# Patient Record
Sex: Female | Born: 1935 | Race: White | Hispanic: No | Smoking: Former smoker
Health system: Southern US, Community
[De-identification: ages and names within clinical notes are randomized; demographics above are authoritative.]

## PROBLEM LIST (undated history)

## (undated) DIAGNOSIS — L309 Dermatitis, unspecified: Secondary | ICD-10-CM

## (undated) DIAGNOSIS — K439 Ventral hernia without obstruction or gangrene: Secondary | ICD-10-CM

## (undated) DIAGNOSIS — M1990 Unspecified osteoarthritis, unspecified site: Secondary | ICD-10-CM

## (undated) DIAGNOSIS — E785 Hyperlipidemia, unspecified: Secondary | ICD-10-CM

## (undated) DIAGNOSIS — E119 Type 2 diabetes mellitus without complications: Secondary | ICD-10-CM

## (undated) DIAGNOSIS — E079 Disorder of thyroid, unspecified: Secondary | ICD-10-CM

## (undated) DIAGNOSIS — F0390 Unspecified dementia without behavioral disturbance: Secondary | ICD-10-CM

## (undated) DIAGNOSIS — I4891 Unspecified atrial fibrillation: Secondary | ICD-10-CM

## (undated) DIAGNOSIS — J449 Chronic obstructive pulmonary disease, unspecified: Secondary | ICD-10-CM

## (undated) DIAGNOSIS — G309 Alzheimer's disease, unspecified: Secondary | ICD-10-CM

## (undated) DIAGNOSIS — D510 Vitamin B12 deficiency anemia due to intrinsic factor deficiency: Secondary | ICD-10-CM

## (undated) DIAGNOSIS — K219 Gastro-esophageal reflux disease without esophagitis: Secondary | ICD-10-CM

## (undated) DIAGNOSIS — F0280 Dementia in other diseases classified elsewhere without behavioral disturbance: Secondary | ICD-10-CM

## (undated) DIAGNOSIS — C3490 Malignant neoplasm of unspecified part of unspecified bronchus or lung: Secondary | ICD-10-CM

## (undated) DIAGNOSIS — E7800 Pure hypercholesterolemia, unspecified: Secondary | ICD-10-CM

## (undated) DIAGNOSIS — L509 Urticaria, unspecified: Secondary | ICD-10-CM

## (undated) DIAGNOSIS — I10 Essential (primary) hypertension: Secondary | ICD-10-CM

## (undated) DIAGNOSIS — R1310 Dysphagia, unspecified: Secondary | ICD-10-CM

## (undated) DIAGNOSIS — C679 Malignant neoplasm of bladder, unspecified: Secondary | ICD-10-CM

## (undated) DIAGNOSIS — Z8709 Personal history of other diseases of the respiratory system: Secondary | ICD-10-CM

## (undated) DIAGNOSIS — H8390 Unspecified disease of inner ear, unspecified ear: Secondary | ICD-10-CM

## (undated) DIAGNOSIS — D649 Anemia, unspecified: Secondary | ICD-10-CM

## (undated) DIAGNOSIS — H25019 Cortical age-related cataract, unspecified eye: Secondary | ICD-10-CM

## (undated) DIAGNOSIS — B019 Varicella without complication: Secondary | ICD-10-CM

## (undated) DIAGNOSIS — E039 Hypothyroidism, unspecified: Secondary | ICD-10-CM

## (undated) DIAGNOSIS — J309 Allergic rhinitis, unspecified: Secondary | ICD-10-CM

## (undated) DIAGNOSIS — B029 Zoster without complications: Secondary | ICD-10-CM

## (undated) HISTORY — DX: Unspecified disease of inner ear, unspecified ear: H83.90

## (undated) HISTORY — DX: Personal history of other diseases of the respiratory system: Z87.09

## (undated) HISTORY — DX: Hypothyroidism, unspecified: E03.9

## (undated) HISTORY — DX: Type 2 diabetes mellitus without complications: E11.9

## (undated) HISTORY — DX: Gastro-esophageal reflux disease without esophagitis: K21.9

## (undated) HISTORY — DX: Cortical age-related cataract, unspecified eye: H25.019

## (undated) HISTORY — DX: Urticaria, unspecified: L50.9

## (undated) HISTORY — DX: Disorder of thyroid, unspecified: E07.9

## (undated) HISTORY — DX: Dermatitis, unspecified: L30.9

## (undated) HISTORY — DX: Unspecified osteoarthritis, unspecified site: M19.90

## (undated) HISTORY — DX: Essential (primary) hypertension: I10

## (undated) HISTORY — DX: Zoster without complications: B02.9

## (undated) HISTORY — DX: Ventral hernia without obstruction or gangrene: K43.9

## (undated) HISTORY — DX: Allergic rhinitis, unspecified: J30.9

## (undated) HISTORY — DX: Vitamin B12 deficiency anemia due to intrinsic factor deficiency: D51.0

## (undated) HISTORY — DX: Malignant neoplasm of bladder, unspecified: C67.9

## (undated) HISTORY — DX: Dementia in other diseases classified elsewhere, unspecified severity, without behavioral disturbance, psychotic disturbance, mood disturbance, and anxiety: F02.80

## (undated) HISTORY — DX: Alzheimer's disease, unspecified: G30.9

## (undated) HISTORY — DX: Varicella without complication: B01.9

## (undated) HISTORY — DX: Chronic obstructive pulmonary disease, unspecified: J44.9

## (undated) HISTORY — DX: Malignant neoplasm of unspecified part of unspecified bronchus or lung: C34.90

## (undated) HISTORY — DX: Dysphagia, unspecified: R13.10

## (undated) HISTORY — PX: HERNIA REPAIR: SHX51

## (undated) HISTORY — DX: Pure hypercholesterolemia, unspecified: E78.00

## (undated) HISTORY — DX: Unspecified atrial fibrillation: I48.91

---

## 1984-01-23 HISTORY — PX: CHOLECYSTECTOMY: SHX55

## 2001-01-22 DIAGNOSIS — C679 Malignant neoplasm of bladder, unspecified: Secondary | ICD-10-CM

## 2001-01-22 HISTORY — DX: Malignant neoplasm of bladder, unspecified: C67.9

## 2001-01-22 HISTORY — PX: BLADDER SURGERY: SHX569

## 2004-02-01 ENCOUNTER — Ambulatory Visit: Payer: Self-pay | Admitting: Internal Medicine

## 2004-02-28 ENCOUNTER — Ambulatory Visit: Payer: Self-pay | Admitting: Internal Medicine

## 2004-05-25 ENCOUNTER — Ambulatory Visit: Payer: Self-pay | Admitting: Gastroenterology

## 2004-08-21 ENCOUNTER — Ambulatory Visit: Payer: Self-pay | Admitting: Urology

## 2004-09-04 ENCOUNTER — Ambulatory Visit: Payer: Self-pay | Admitting: Internal Medicine

## 2004-09-22 ENCOUNTER — Ambulatory Visit: Payer: Self-pay | Admitting: Internal Medicine

## 2004-10-02 ENCOUNTER — Ambulatory Visit: Payer: Self-pay | Admitting: Internal Medicine

## 2004-11-07 ENCOUNTER — Ambulatory Visit: Payer: Self-pay | Admitting: Internal Medicine

## 2004-12-08 ENCOUNTER — Inpatient Hospital Stay (HOSPITAL_COMMUNITY): Admission: RE | Admit: 2004-12-08 | Discharge: 2004-12-15 | Payer: Self-pay | Admitting: Thoracic Surgery

## 2004-12-08 ENCOUNTER — Ambulatory Visit: Payer: Self-pay | Admitting: Critical Care Medicine

## 2004-12-08 ENCOUNTER — Encounter (INDEPENDENT_AMBULATORY_CARE_PROVIDER_SITE_OTHER): Payer: Self-pay | Admitting: *Deleted

## 2004-12-08 HISTORY — PX: LUNG CANCER SURGERY: SHX702

## 2004-12-18 ENCOUNTER — Ambulatory Visit: Payer: Self-pay | Admitting: Oncology

## 2004-12-20 ENCOUNTER — Encounter: Admission: RE | Admit: 2004-12-20 | Discharge: 2004-12-20 | Payer: Self-pay | Admitting: Thoracic Surgery

## 2005-01-03 ENCOUNTER — Encounter: Admission: RE | Admit: 2005-01-03 | Discharge: 2005-01-03 | Payer: Self-pay | Admitting: Thoracic Surgery

## 2005-02-08 ENCOUNTER — Ambulatory Visit: Payer: Self-pay | Admitting: Unknown Physician Specialty

## 2005-02-14 ENCOUNTER — Encounter: Admission: RE | Admit: 2005-02-14 | Discharge: 2005-02-14 | Payer: Self-pay | Admitting: Thoracic Surgery

## 2005-03-20 ENCOUNTER — Ambulatory Visit: Payer: Self-pay | Admitting: Oncology

## 2005-05-02 ENCOUNTER — Encounter: Admission: RE | Admit: 2005-05-02 | Discharge: 2005-05-02 | Payer: Self-pay | Admitting: Thoracic Surgery

## 2005-06-27 ENCOUNTER — Ambulatory Visit: Payer: Self-pay | Admitting: Internal Medicine

## 2005-08-02 ENCOUNTER — Ambulatory Visit: Payer: Self-pay | Admitting: Specialist

## 2005-09-04 ENCOUNTER — Ambulatory Visit: Payer: Self-pay | Admitting: Oncology

## 2005-09-19 ENCOUNTER — Encounter: Admission: RE | Admit: 2005-09-19 | Discharge: 2005-09-19 | Payer: Self-pay | Admitting: Thoracic Surgery

## 2006-03-04 ENCOUNTER — Ambulatory Visit: Payer: Self-pay | Admitting: Oncology

## 2006-03-06 LAB — CBC WITH DIFFERENTIAL (CANCER CENTER ONLY)
BASO#: 0.1 10*3/uL (ref 0.0–0.2)
BASO%: 0.6 % (ref 0.0–2.0)
Eosinophils Absolute: 0.3 10*3/uL (ref 0.0–0.5)
HCT: 41 % (ref 34.8–46.6)
HGB: 13.9 g/dL (ref 11.6–15.9)
LYMPH#: 2.5 10*3/uL (ref 0.9–3.3)
LYMPH%: 32.5 % (ref 14.0–48.0)
MCV: 90 fL (ref 81–101)
MONO#: 0.6 10*3/uL (ref 0.1–0.9)
NEUT%: 55.6 % (ref 39.6–80.0)
RDW: 12.2 % (ref 10.5–14.6)
WBC: 7.7 10*3/uL (ref 3.9–10.0)

## 2006-03-06 LAB — COMPREHENSIVE METABOLIC PANEL
ALT: 27 U/L (ref 0–35)
AST: 27 U/L (ref 0–37)
Alkaline Phosphatase: 82 U/L (ref 39–117)
Sodium: 140 mEq/L (ref 135–145)
Total Bilirubin: 0.5 mg/dL (ref 0.3–1.2)
Total Protein: 7 g/dL (ref 6.0–8.3)

## 2006-04-03 ENCOUNTER — Encounter: Admission: RE | Admit: 2006-04-03 | Discharge: 2006-04-03 | Payer: Self-pay | Admitting: Thoracic Surgery

## 2006-04-03 ENCOUNTER — Ambulatory Visit: Payer: Self-pay | Admitting: Thoracic Surgery

## 2006-08-26 ENCOUNTER — Ambulatory Visit: Payer: Self-pay | Admitting: Oncology

## 2006-08-27 LAB — COMPREHENSIVE METABOLIC PANEL
Alkaline Phosphatase: 78 U/L (ref 39–117)
BUN: 16 mg/dL (ref 6–23)
Glucose, Bld: 173 mg/dL — ABNORMAL HIGH (ref 70–99)
Total Bilirubin: 0.3 mg/dL (ref 0.3–1.2)

## 2006-08-27 LAB — CBC WITH DIFFERENTIAL (CANCER CENTER ONLY)
BASO#: 0.1 10*3/uL (ref 0.0–0.2)
BASO%: 0.6 % (ref 0.0–2.0)
EOS%: 2 % (ref 0.0–7.0)
HCT: 43.1 % (ref 34.8–46.6)
HGB: 14.3 g/dL (ref 11.6–15.9)
LYMPH%: 27.1 % (ref 14.0–48.0)
MCH: 29.6 pg (ref 26.0–34.0)
MCHC: 33.2 g/dL (ref 32.0–36.0)
MCV: 89 fL (ref 81–101)
MONO%: 7.1 % (ref 0.0–13.0)
NEUT%: 63.2 % (ref 39.6–80.0)
RDW: 13.1 % (ref 10.5–14.6)

## 2006-08-27 LAB — LACTATE DEHYDROGENASE: LDH: 212 U/L (ref 94–250)

## 2007-02-28 ENCOUNTER — Ambulatory Visit: Payer: Self-pay | Admitting: Oncology

## 2007-03-04 LAB — COMPREHENSIVE METABOLIC PANEL
ALT: 20 U/L (ref 0–35)
AST: 20 U/L (ref 0–37)
Albumin: 4.2 g/dL (ref 3.5–5.2)
Alkaline Phosphatase: 81 U/L (ref 39–117)
BUN: 21 mg/dL (ref 6–23)
Calcium: 9.1 mg/dL (ref 8.4–10.5)
Glucose, Bld: 161 mg/dL — ABNORMAL HIGH (ref 70–99)
Potassium: 4.2 mEq/L (ref 3.5–5.3)
Total Bilirubin: 0.4 mg/dL (ref 0.3–1.2)
Total Protein: 6.9 g/dL (ref 6.0–8.3)

## 2007-03-04 LAB — CBC WITH DIFFERENTIAL (CANCER CENTER ONLY)
Eosinophils Absolute: 0.3 10*3/uL (ref 0.0–0.5)
HCT: 44.1 % (ref 34.8–46.6)
LYMPH#: 3 10*3/uL (ref 0.9–3.3)
LYMPH%: 34.3 % (ref 14.0–48.0)
MCV: 90 fL (ref 81–101)
MONO#: 0.6 10*3/uL (ref 0.1–0.9)
NEUT%: 54.1 % (ref 39.6–80.0)
Platelets: 317 10*3/uL (ref 145–400)
RBC: 4.91 10*6/uL (ref 3.70–5.32)
WBC: 8.7 10*3/uL (ref 3.9–10.0)

## 2007-04-28 ENCOUNTER — Ambulatory Visit: Payer: Self-pay | Admitting: Internal Medicine

## 2007-08-29 ENCOUNTER — Ambulatory Visit: Payer: Self-pay | Admitting: Oncology

## 2008-03-12 ENCOUNTER — Ambulatory Visit: Payer: Self-pay | Admitting: Oncology

## 2009-02-16 ENCOUNTER — Ambulatory Visit: Payer: Self-pay | Admitting: Unknown Physician Specialty

## 2010-02-11 ENCOUNTER — Encounter: Payer: Self-pay | Admitting: Thoracic Surgery

## 2010-02-12 ENCOUNTER — Encounter: Payer: Self-pay | Admitting: Thoracic Surgery

## 2010-02-13 ENCOUNTER — Encounter: Payer: Self-pay | Admitting: Oncology

## 2010-06-09 NOTE — H&P (Signed)
Cindy [name], Cindy [name]                 ACCOUNT NO.:  [id number]   MEDICAL RECORD NO.:  [id number]          PATIENT TYPE:  INP   LOCATION:  NA                           FACILITY:  [facility]   PHYSICIAN:  Ines Bloomer, M.D. DATE OF BIRTH:  23-Aug-1933   DATE OF ADMISSION:  DATE OF DISCHARGE:                                HISTORY & PHYSICAL   CHIEF COMPLAINT:  Left upper lobe mass.   HISTORY OF PRESENT ILLNESS:  This 77 year old patient has been a long  history of smoking and she quit smoking five weeks ago.  She has a history  of chronic obstructive pulmonary disease with an FVC of 2.83 and FEV1 of  1.14 and diffusion capacity of 46%.  A CT scan showed evidence of chronic  obstructive pulmonary disease and a left upper lobe nodule 1.8 x 1.2 cm in  size.  PET scan showed an SUV of 7.6 in the left mid lateral lung.  She has  had no hemoptysis, pleuritic chest pain, fever, chills, or weight loss.   PAST MEDICAL HISTORY:  1.  She had a bladder cancer treated in 2004 with topical therapy.  2.  Hypertension.  3.  Hyperlipidemia.  4.  She had a previous cholecystectomy.   She has no allergies.   MEDICATIONS:  1.  Metoprolol 50 mg a day.  2.  Hydrochlorothiazide 25 mg a day.  3.  Benicar 20 mg daily.  4.  Lovastatin 20 mg a day.  5.  Wellbutrin 150 mg twice a day.  6.  Aspirin.   FAMILY HISTORY:  Negative for vascular disease and cancer.   SOCIAL HISTORY:  She is married.  Has three children.  Is retired.  Does not  drink.  She quit smoking five weeks ago.  She has a 50+-pack-year history.  Does not drink alcohol on a regular basis.   REVIEW OF SYSTEMS:  She is 184 pounds.  5 feet 6 inches.  CARDIAC:  No  angina or atrial fibrillation.  PULMONARY:  She gets shortness of breath  with exertion.  Has occasional wheezing.  GI:  No nausea, vomiting,  constipation, or diarrhea.  No reflux, hiatal hernia, or melena.  GU:  She  has frequent urinations.  VASCULAR:  No claudications, DVT, or  TIAs.  NEUROLOGIC:  No headaches, blackouts, or seizures.  ORTHOPEDIC:  She  complains of arthritis.  No psychiatric illnesses.  HEENT:  Eye/ENT:  No  change in her eyesight or hearing.  HEMATOLOGICAL:  No anemia.   PHYSICAL EXAMINATION:  GENERAL:  She is a slightly obese Caucasian female in  no acute distress.  VITAL SIGNS:  Blood pressure 153/90, pulse 66, respirations 18, saturations  94%.  HEENT:  Head is atraumatic.  Eyes:  Pupils are equal, round, and reactive to  light and accommodation.  Extraocular movements are normal.  Ears:  Tympanic  membranes are intact.  Nose:  There is no septal deviation.  Throat is  without mass, lesions.  NECK:  Supple with no thyromegaly, no supraclavicular adenopathy.  CHEST:  Distal breath sounds and mild bilateral  wheezing.  HEART:  Regular sinus rhythm.  No murmurs.  ABDOMEN:  Soft.  There is no hepatosplenomegaly.  EXTREMITIES:  Pulses are 2+.  There is no clubbing or edema.  NEUROLOGIC:  She is oriented x3.  Sensory and motor are intact.  SKIN:  Without lesions.   IMPRESSION:  1.  Left upper lobe mass.  2.  Chronic obstructive pulmonary disease.  3.  Hypertension.  4.  Hyperlipidemia.  5.  Status post bladder cancer.   PLAN:  Left VATS, left upper lobectomy.           ______________________________  Ines Bloomer, M.D.     DPB/MEDQ  D:  12/06/2004  T:  12/06/2004  Job:  [id number]

## 2010-06-09 NOTE — Op Note (Signed)
Cindy [name], Cindy [name]                 ACCOUNT NO.:  [id number]   MEDICAL RECORD NO.:  [id number]          PATIENT TYPE:  INP   LOCATION:  2899                         FACILITY:  [facility]   PHYSICIAN:  Ines Bloomer, M.D. DATE OF BIRTH:  November 04, 1933   DATE OF PROCEDURE:  DATE OF DISCHARGE:                                 OPERATIVE REPORT   Audio too short to transcribe (less than 5 seconds)           ______________________________  Ines Bloomer, M.D.     DPB/MEDQ  D:  12/08/2004  T:  12/08/2004  Job:  [id number]

## 2010-06-09 NOTE — Discharge Summary (Signed)
NAMEHEIDI, MACLIN                 ACCOUNT NO.:  [id number]   MEDICAL RECORD NO.:  [id number]          PATIENT TYPE:  INP   LOCATION:  3306                         FACILITY:  [facility]   PHYSICIAN:  Ines Bloomer, M.D. DATE OF BIRTH:  [birth year]   DATE OF ADMISSION:  12/08/2004  DATE OF DISCHARGE:                                 DISCHARGE SUMMARY   ADMISSION DIAGNOSIS:  Left upper lobe lung lesion.   DISCHARGE DIAGNOSIS:  1.  Hypertension.  2.  Chronic obstructive pulmonary disease.  3.  Acid reflux.  4.  Hyperlipidemia.  5.  Status post bladder cancer with bladder resection and six weeks of      chemotherapy in 2003 with Dr. Achilles Dunk.  6.  Left upper lobe lung lesion status post lingulectomy, pathology      revealing squamous cell carcinoma, T1N0MX.  7.  Postoperative atrial fibrillation, stable.   PAST SURGICAL HISTORY:  1.  Cholecystectomy.  2.  Bladder removal.  3.  Left lingulectomy, December 08, 2004.   ALLERGIES:  No known drug allergies.   BRIEF HISTORY:  The patient is a 77 year old Caucasian female with COPD who  presented to her primary care physician with increasing shortness of breath.  A chest x-ray was performed which revealed a left upper lobe lung lesion.  Subsequent CT scan confirmed left upper lobe lesion suspicious for carcinoma  and, therefore, the patient was referred to Dr. Edwyna Shell.  Dr. Edwyna Shell  evaluated the patient and referred the patient for a PET scan which revealed  an SUV of 7.6 in the left mid lateral lung.  After review of the patient and  her information, it was Dr. Scheryl Darter opinion that the patient should proceed  with left VATS and lingulectomy.   HOSPITAL COURSE:  The patient was admitted and taken to the OR on December 08, 2004, for a left video assisted thoracoscopic surgery, mini-thoracotomy,  left lingulectomy, and lymph node dissection.  Pathology revealed squamous  cell carcinoma, T1N0MX.  The patient tolerated the procedure well and  was  hemodynamically stable immediately postoperatively.  The patient was  transferred from the OR to the post anesthesia care unit in stable  condition.  The patient was extubated without complications and woke up from  anesthesia neurologically intact.  The patient's postoperative course has  progressed as expected with only minor complications.  The patient's  invasive lines and chest tubes were discontinued in a routine manner.  Her  chest x-ray has remained stable.  On postoperative day two, the patient was  noted to be in atrial fibrillation.  She was started on IV Amiodarone and  her beta blocker dose was increased.  This was maintained for several days.  The patient was subsequently successfully switched to p.o. Amiodarone.  She  is currently maintaining normal sinus rhythm.  The pulmonary critical care  medicine team has also been following the patient throughout the  postoperative course.  Secondary to the patient's pathology, Dr. Drue Second of the hematology-oncology service, was consulted on December 11, 2004.  She evaluated the patient and it was  her opinion that the patient would not  benefit from adjuvant chemotherapy  she will follow up with the patient two  weeks after discharge.  The remainder of the patient's postoperative course  progressed as expected.  On postoperative day five, the patient is afebrile  with stable vital signs and maintaining a normal sinus rhythm.  She is  currently on p.o. Amiodarone and a beta blocker.  Her chest x-ray is stable.  As long as the patient continues to progress in the current manner, she will  be ready for discharge in the next 1-2 days pending morning round re-  evaluation.   CONDITION ON DISCHARGE:  Stable.   LABORATORY DATA:  CBC and BMP on December 11, 2004, showed white count 9.6,  hemoglobin 11.4, hematocrit 33.4, platelets 263, sodium 135, potassium 3.9,  BUN 13, creatinine 1.2, glucose 136.   DISCHARGE MEDICATIONS:  1.   Toprol XL 50 mg daily.  2.  Benicar 20 mg daily.  3.  HCTZ 25 mg daily.  4.  Wellbutrin SR 150 mg b.i.d.  5.  Advair Diskus 250 per 50 mcg inhaled b.i.d.  6.  Lovastatin 20 mg daily.  7.  Amiodarone 200 mg, 2 tablets b.i.d. x 5 days, then 2 tablets daily.  8.  Tylox 1-2 p.o. q.4-6h. p.r.n. pain.   DISCHARGE INSTRUCTIONS:  Activities:  No driving and no heavy lifting x 3  weeks and the patient should continue daily breathing and walking exercises.  Diet:  Low salt, low fat.  Wound care:  The patient may shower daily and  clean the incisions with soap and water.  If wound problems arise, she  should contact the CVTS office at [phone].  Follow up appointment with Dr.  Edwyna Shell on Wednesday, December 20, 2004, at 12 p.m.  The patient should  present for chest x-ray at 11 a.m.  Follow up with Dr. Welton Flakes two weeks after  discharge, the patient should contact her office at [phone].  Follow up  with Dr. Lorin Picket, the patient is also be instructed to contact his office for  an appointment 2-4 weeks after discharge.      Pecola Leisure, PA    ______________________________  Ines Bloomer, M.D.    AY/MEDQ  D:  12/13/2004  T:  12/13/2004  Job:  [id number]   cc:   Drue Second, MD  Fax: [phone]   Dr. Lorin Picket

## 2010-06-09 NOTE — Consult Note (Signed)
NAMECERINITY, ZYNDA NO.:  [id number]   MEDICAL RECORD NO.:  [id number]          PATIENT TYPE:  INP   LOCATION:  3306                         FACILITY:  [facility]   PHYSICIAN:  Drue Second, MD       DATE OF BIRTH:  August 16, 1933   DATE OF CONSULTATION:  12/11/2004  DATE OF DISCHARGE:                                   CONSULTATION   REFERRING PHYSICIAN:  Ines Bloomer, M.D.   CHIEF COMPLAINT:  Left upper lobe lung lesion, status post lingulectomy.   HISTORY OF PRESENT ILLNESS:  Ms. Bilger is a 77 year old Caucasian female who  has COPD and is status post a left lingulectomy on December 08, 2004, for a  suspected malignant lesion in the left upper lobe.  The patient presented to  her primary care physician with increased shortness of breath and had a  chest x-ray, which revealed a left upper lobe lung lesion.  Was then  referred for a CT scan, which confirmed a left upper lobe lesion suspicious  for cancer, and its size was 1.8 x 1.2 cm per Dr. Scheryl Darter H&P.  She was  then referred for  PET scan, which revealed an SUV of 7.6 in the left  midlateral lung.  The patient saw Dr. Edwyna Shell and was scheduled for VATS with  lingulectomy done on December 08, 2004, without complications.  Prior to her  surgery, the patient denied any hemoptysis, chest pain, weight loss or gain,  fever, chills or night sweats.  She did, however, complain of a cough  productive of yellow sputum, shortness of breath, dyspnea on exertion and  two-pillow orthopnea and urinary frequency. The rest or the review of  systems was negative.  The patient's only complaint at this time is some  constipation and pain at the site of the surgery.   PAST MEDICAL HISTORY:  1.  Hypertension.  2.  COPD.  3.  Acid reflux.  4.  Hyperlipidemia.  5.  Status post bladder cancer with bladder resection and six weeks of      chemotherapy in 2003 with Dr. Achilles Dunk, urology.   MEDICATIONS:  1.  Toprol 50 mg p.o. daily.  2.   Benicar 20 mg p.o. daily.  3.  Hydrochlorothiazide 25 mg p.o. daily.  4.  Lovastatin 20 mg p.o. daily.  5.  Wellbutrin 150 mg p.o. b.i.d.  6.  Aspirin 81 mg p.o. daily.  7.  Advair 250/50 mcg one puff daily.   PAST SURGICAL HISTORY:  1.  Cholecystectomy.  2.  Bladder removal.  3.  Left lingulectomy, postop day #3.   SOCIAL HISTORY:  The patient smoked cigarettes for 50 years, about 1-1/2  packs per day, and she quit seven weeks ago.  She denies any alcohol or  illicit drug use.  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   FAMILY HISTORY:  Her mother died at the age of 75 of diabetes mellitus type  2 complications.  Her father died at 69 of  Alzheimer's disease.  One brother  died at 63 of diabetes mellitus type 2, one brother is alive at 12 with  bladder cancer.  She also has one daughter who is living with diabetes  mellitus type 1, and six other brother sisters are alive and well and her  two other daughters are alive and well.   OB/GYN HISTORY:  Menarche at age 69, menopause at age 19.  No hormone  replacement therapy.  G4, P3, AB1.   PREVENTIVE MEDICINE HISTORY:  The patient had a colonoscopy approximately  six months ago, which was normal.  She gets yearly mammograms, last  mammogram was normal.  She gets yearly Pap smears as well, which have been  normal.   PHYSICAL EXAMINATION:  VITAL SIGNS:  Temperature 97.8, T-max 99.1, blood  pressure 135/65, heart rate 85, respirations 20, and she was saturating at  97% on room air.  GENERAL:  She is awake, alert and oriented x3, in no acute distress.  She  was out of bed to chair.  HEENT:  She was normocephalic, atraumatic.  Extraocular movements were  intact.  Pupils equal, round, and reactive to light.  Oropharynx is clear  and mucous membranes were moist.  NECK:  Supple with no lymphadenopathy, and there was a right IJ triple-lumen  catheter placed.   CHEST:  Lungs were clear to auscultation bilaterally with prolonged  expiration, and the left posterior chest had a dressing that was clean, dry,  and intact.  CARDIOVASCULAR:  She was regular rate and rhythm with no murmurs.  ABDOMEN:  Soft, nontender, nondistended, bowel sounds were present, and she  was obese.  EXTREMITIES:  No clubbing, cyanosis, or edema.  NEUROLOGIC:  Nonfocal.   LABORATORY DATA:  White blood cell count 9.6, hemoglobin 11.4, platelets  263, MCV 90.1.  sodium 135, potassium 3.9, chloride 101, bicarb 28, BUN 13,  creatinine 1.2, glucose 136, calcium 8.6.   ASSESSMENT AND PLAN:  This is a 77 year old female with chronic obstructive  pulmonary disease, status post left lingulectomy secondary to a lung lesion  suspicious for cancer.  Pathology is pending.   Suspected primary lung cancer.  Although the pathology is pending, we will  be happy to see this patient at [facility] for possible  chemotherapy treatments depending on her pathology results once the patient  is discharged from the hospital.      Chauncey Reading, D.O.      Drue Second, MD  Electronically Signed    EA/MEDQ  D:  12/11/2004  T:  12/12/2004  Job:  [id number]

## 2010-12-12 ENCOUNTER — Ambulatory Visit: Payer: Self-pay | Admitting: Internal Medicine

## 2011-11-23 ENCOUNTER — Telehealth: Payer: Self-pay | Admitting: Internal Medicine

## 2011-11-23 NOTE — Telephone Encounter (Signed)
Called [phone] and left a message, called other number, but was busy. Will call back later.

## 2011-11-23 NOTE — Telephone Encounter (Signed)
Cindy [name] called about her mom Cindy [name].  Cindy [name] stated her mom's stomach as been bothering her several weeks.  Cindy [name] stated her mom has a hernia   Pain in stomach Can she been see sooner than 02/13/12

## 2011-11-23 NOTE — Telephone Encounter (Signed)
If having abdominal pain and with her history of hernia - would rec evaluation at acute care today (asap) and confirm nothing more acute going on and then I can follow up with her after that appt.  Thanks.

## 2011-11-23 NOTE — Telephone Encounter (Signed)
Called patients daughter back and advised her to seek acute care for her mother (ASAP). Patients daughter stated that she would take her (ASAP) and follow-up with you afterward.

## 2011-11-24 ENCOUNTER — Emergency Department: Payer: Self-pay | Admitting: Emergency Medicine

## 2011-11-24 LAB — COMPREHENSIVE METABOLIC PANEL WITH GFR
Albumin: 3.2 g/dL — ABNORMAL LOW
Alkaline Phosphatase: 90 U/L
Anion Gap: 10
BUN: 18 mg/dL
Bilirubin,Total: 0.4 mg/dL
Calcium, Total: 8.7 mg/dL
Chloride: 106 mmol/L
Co2: 22 mmol/L
Creatinine: 0.82 mg/dL
EGFR (African American): >60
EGFR (Non-African Amer.): >60
Glucose: 264 mg/dL — ABNORMAL HIGH
Osmolality: 287
Potassium: 3.8 mmol/L
SGOT(AST): 19 U/L
SGPT (ALT): 21 U/L
Sodium: 138 mmol/L
Total Protein: 6.9 g/dL

## 2011-11-24 LAB — CBC WITH DIFFERENTIAL/PLATELET
Basophil #: 0.1 10*3/uL (ref 0.0–0.1)
Eosinophil #: 0.2 10*3/uL (ref 0.0–0.7)
HCT: 44.4 % (ref 35.0–47.0)
HGB: 14.7 g/dL (ref 12.0–16.0)
Lymphocyte %: 33.2 %
MCHC: 33 g/dL (ref 32.0–36.0)
Monocyte %: 8.1 %
Neutrophil %: 55.7 %
RDW: 13.4 % (ref 11.5–14.5)
WBC: 9.1 10*3/uL (ref 3.6–11.0)

## 2011-11-29 ENCOUNTER — Telehealth: Payer: Self-pay | Admitting: Internal Medicine

## 2011-11-29 NOTE — Telephone Encounter (Signed)
I can see her on 12/05/11 (wednesday) at 11:45

## 2011-11-29 NOTE — Telephone Encounter (Signed)
Pt aware of appointment 

## 2011-11-29 NOTE — Telephone Encounter (Signed)
Cindy [name] called She took her mom to urgent care Saturday and urgent care sent her to [facility]  bp 209/89  215/90 went down 170/86 Er wants Cindy [name] to see dr ely for hernia surgery Cindy [name] would like to talk to dr scott before seeing ely

## 2011-12-05 ENCOUNTER — Encounter: Payer: Self-pay | Admitting: Internal Medicine

## 2011-12-05 ENCOUNTER — Ambulatory Visit (INDEPENDENT_AMBULATORY_CARE_PROVIDER_SITE_OTHER): Payer: Medicare Other | Admitting: Internal Medicine

## 2011-12-05 VITALS — BP 160/90 | HR 78 | Temp 98.2°F | Ht 63.5 in | Wt 169.8 lb

## 2011-12-05 DIAGNOSIS — C679 Malignant neoplasm of bladder, unspecified: Secondary | ICD-10-CM

## 2011-12-05 DIAGNOSIS — N289 Disorder of kidney and ureter, unspecified: Secondary | ICD-10-CM

## 2011-12-05 DIAGNOSIS — I10 Essential (primary) hypertension: Secondary | ICD-10-CM

## 2011-12-05 DIAGNOSIS — K469 Unspecified abdominal hernia without obstruction or gangrene: Secondary | ICD-10-CM

## 2011-12-05 DIAGNOSIS — E7800 Pure hypercholesterolemia, unspecified: Secondary | ICD-10-CM

## 2011-12-05 DIAGNOSIS — Z23 Encounter for immunization: Secondary | ICD-10-CM

## 2011-12-05 DIAGNOSIS — C3490 Malignant neoplasm of unspecified part of unspecified bronchus or lung: Secondary | ICD-10-CM

## 2011-12-05 DIAGNOSIS — E119 Type 2 diabetes mellitus without complications: Secondary | ICD-10-CM

## 2011-12-05 MED ORDER — NYSTATIN 100000 UNIT/GM EX CREA: TOPICAL_CREAM | Freq: Two times a day (BID) | CUTANEOUS | Status: DC

## 2011-12-05 NOTE — Patient Instructions (Signed)
It was nice seeing you today.  I am sorry you have been having problems.  We will get an appt scheduled for you - to see Dr Michela Pitcher.  Let me know if you have any problems.

## 2011-12-09 ENCOUNTER — Encounter: Payer: Self-pay | Admitting: Internal Medicine

## 2011-12-09 DIAGNOSIS — E7800 Pure hypercholesterolemia, unspecified: Secondary | ICD-10-CM | POA: Insufficient documentation

## 2011-12-09 DIAGNOSIS — I10 Essential (primary) hypertension: Secondary | ICD-10-CM | POA: Insufficient documentation

## 2011-12-09 DIAGNOSIS — Z8551 Personal history of malignant neoplasm of bladder: Secondary | ICD-10-CM | POA: Insufficient documentation

## 2011-12-09 DIAGNOSIS — C3490 Malignant neoplasm of unspecified part of unspecified bronchus or lung: Secondary | ICD-10-CM | POA: Insufficient documentation

## 2011-12-09 DIAGNOSIS — N289 Disorder of kidney and ureter, unspecified: Secondary | ICD-10-CM | POA: Insufficient documentation

## 2011-12-09 DIAGNOSIS — E119 Type 2 diabetes mellitus without complications: Secondary | ICD-10-CM | POA: Insufficient documentation

## 2011-12-09 MED ORDER — GLIMEPIRIDE 2 MG PO TABS: 2.0000 mg | ORAL_TABLET | Freq: Two times a day (BID) | ORAL | Status: DC

## 2011-12-09 NOTE — Assessment & Plan Note (Signed)
Does not check sugars.  Does not follow her diet.  Does not keep regular appts.  Instructed on the importance of checking her sugar.  Follow.  Will review ER labs.  Will need a1c.  Needs to keep up to date with eye exams.

## 2011-12-09 NOTE — Assessment & Plan Note (Signed)
Has a history of lung cancer (squamous cell) and is s/p left upper lobe lingulectomy.  Has known COPD.  Not seeing her pulmonologist.  Increased sob with exertion.  No significantly changed.  Discussed the risk of surgery with her underlying lung condition.  Will see what surgery says about the hernia.  Follow closely.

## 2011-12-09 NOTE — Assessment & Plan Note (Signed)
Blood pressure improved on my check.  Will hold on making changes in her meds.  Have her monitor her blood pressure and send in readings.  Get her back in soon to reassess.  Follow.

## 2011-12-09 NOTE — Progress Notes (Signed)
  Subjective:    Patient ID: Cindy [name], female    DOB: Apr 30, 1933, 78 y.o.   MRN: [id number]  HPI 78 year old female with past history of hypertension, hypercholesterolemia, COPD, lung cancer s/p left upper lobe lingulectomy 11/06 and bladder cancer (previously followed by Dr Achilles Dunk).  She comes in today as a work in for an ER follow up.  Has had an abdominal wall hernia for a while.  Over the last 6 months, she has noticed increased pain.  Went to the ER recently for increasing abdominal pain.  CT scan performed and she was informed that she needed to see surgery.  She came in here for a follow up.  States her bowels are moving.  She does take a stool softener bid.  No nausea or vomiting. She does report increased pain.  Blood pressure while in the ER was also elevated.  Not checking her sugars regularly.  Not watching what she eats.  Breathing is stable.  She is sob with exertion.  Not seeing her pulmonologist.    Past Medical History  Diagnosis Date  . COPD (chronic obstructive pulmonary disease)   . Lung cancer     squamous cell s/p left upper lobe llingulectomy 12/08/04 - Dr Edwyna Shell  . Bladder cancer 2003    s/p resection and chemotherapy, followed by Dr Achilles Dunk  . Hypertension   . Hypercholesterolemia   . GERD (gastroesophageal reflux disease)   . Atrial fibrillation     post op, converted to NSR wtih amiodarone  . Hypothyroidism   . Inner ear disease   . Diabetes mellitus     Review of Systems Patient denies any headache, lightheadedness or dizziness.  No sinus or allergy symptoms.  No chest pain, tightness or palpitations.  Gets sob with exertion.  No significant change.   No nausea or vomiting.  No bowel change, such as diarrhea, constipation, BRBPR or melana.  States she takes the stool softeners and this helps to regulate her bowels.  Does have the increased pain with palpation over the hernia.   No urine change.        Objective:   Physical Exam Filed Vitals:   12/05/11 1212    BP: 160/90  Pulse: 78  Temp: 98.2 F (36.8 C)   Blood pressure recheck:  73-68/84  78 year old female in no acute distress.   HEENT:  Nares - clear.  OP- without lesions or erythema.  NECK:  Supple, nontender.    HEART:  Appears to be regular. LUNGS:  Without crackles or wheezing audible.   RADIAL PULSE:  Equal bilaterally.  ABDOMEN:  Soft.   No audible abdominal bruit.  Bowel sounds present and normal.  Large hernia - pain with palpation.   EXTREMITIES:  No increased edema to be present.  Stable.                   Assessment & Plan:  HERNIA.  Abdominal wall hernia.  Now with increasing pain.  Was evaluated in the ER.  CT performed.  Obtain results.  Daughter accompanies her.  History obtained from both of them.  States ER recommended surgery evaluation.  Desires to see Dr Michela Pitcher.  Will arrange appt.    HEALTH MAINTENANCE.  Overdue a physical .  Arrange.  Has been noncompliant with keeping appts.

## 2011-12-09 NOTE — Assessment & Plan Note (Signed)
Low fat/low cholesterol diet.  Check lipid panel with next labs.  Follow.

## 2011-12-09 NOTE — Assessment & Plan Note (Signed)
Renal function on last check improved.  Last Cr I have documented 10/12- 1.0.  Obtain records from ER.  Avoid antiinflammatories and other nephrotoxic drugs.  Follow.

## 2011-12-09 NOTE — Assessment & Plan Note (Signed)
She has stopped follow ups and treatments with Dr Achilles Dunk.  Needs follow up.

## 2011-12-12 ENCOUNTER — Telehealth: Payer: Self-pay | Admitting: Internal Medicine

## 2011-12-12 NOTE — Telephone Encounter (Signed)
Glimepiride 2mg  tablet  180.0 ea [facility] [address] Date written 06/19/11 Take 1 tablet by mouth twice a day  Appointment with dr Ken[place] 02/13/12

## 2011-12-14 NOTE — Telephone Encounter (Signed)
Patient stated that she has already gotten this script.

## 2012-01-02 ENCOUNTER — Other Ambulatory Visit: Payer: Self-pay | Admitting: Internal Medicine

## 2012-01-02 NOTE — Telephone Encounter (Signed)
Pt is needing refill on Glimpride ??? She uses [facility] in [place]

## 2012-01-02 NOTE — Telephone Encounter (Signed)
Patient called and stated that she had not gotten her prescription yet. I called pharmacy again and they had not filled rx, there was a mix-up. Pharmacy said they fixed mix-up and where going to fill rx. Called patient back to let her know.

## 2012-01-02 NOTE — Telephone Encounter (Signed)
Notify pt med had been called [facility] to [facility] - I clarified with them.  They have updated rx

## 2012-01-03 ENCOUNTER — Telehealth: Payer: Self-pay | Admitting: Internal Medicine

## 2012-01-03 ENCOUNTER — Other Ambulatory Visit: Payer: Self-pay | Admitting: *Deleted

## 2012-01-03 DIAGNOSIS — Z760 Encounter for issue of repeat prescription: Secondary | ICD-10-CM

## 2012-01-03 MED ORDER — DOCUSATE SODIUM 100 MG PO CAPS: 100.0000 mg | ORAL_CAPSULE | Freq: Two times a day (BID) | ORAL | Status: DC

## 2012-01-03 MED ORDER — NYSTATIN 100000 UNIT/GM EX CREA: TOPICAL_CREAM | Freq: Two times a day (BID) | CUTANEOUS | Status: DC

## 2012-01-03 MED ORDER — METOPROLOL SUCCINATE ER 50 MG PO TB24: 50.0000 mg | ORAL_TABLET | Freq: Every day | ORAL | Status: DC

## 2012-01-03 NOTE — Telephone Encounter (Signed)
Per pharmacy scripts had to be ordered;patient was given six until shipment came. Refilled scripts for nystatin and metoprolol

## 2012-01-03 NOTE — Telephone Encounter (Signed)
Refilled nystatin and metoprolol. Patients other scripts had to be ordered and they will be in this afternoon per pharmacy.

## 2012-01-03 NOTE — Telephone Encounter (Signed)
Pt's daughter is calling back and saying they never received the rx ??

## 2012-01-03 NOTE — Telephone Encounter (Signed)
Please call pharmacy because I personally spoke to pharmacist yesterday and clarified rx.

## 2012-01-03 NOTE — Telephone Encounter (Signed)
Refill request for glimepiride 2 mg table Qty: 180  Sig: take 1 tablet by mouth twice a day  Patient has been seen

## 2012-01-04 ENCOUNTER — Other Ambulatory Visit: Payer: Self-pay | Admitting: *Deleted

## 2012-01-28 ENCOUNTER — Telehealth: Payer: Self-pay | Admitting: Internal Medicine

## 2012-01-28 NOTE — Telephone Encounter (Signed)
Patient notified. Advised patient's daughter to follow-up with [place] after.

## 2012-01-28 NOTE — Telephone Encounter (Signed)
With these symptoms and he history  - she needs evaluation - to acute care tonight

## 2012-01-28 NOTE — Telephone Encounter (Signed)
Patient Information:  Caller Name: Alexia Freestone  Phone: [phone]  Patient: Cindy [name], Cindy [name]  Gender: Female  DOB: 1933-10-29  Age: 79 Years  PCP: [facility]  Office Follow Up:  Does the office need to follow up with this patient?: Yes  Instructions For The Office: Sister is asking for medication since it is to be so bitterly cold on Tuesday.  I told her that I would ask if this can be done without an appt.  RN Note:  Unable to triage, she is not with her sister.  Wanting an appt.  Symptoms  Reason For Call & Symptoms: Sister calling, has had a cough, nasal congestion, decreased appetite.  She is not with her sister at this time.  Reviewed Health History In EMR: N/A  Reviewed Medications In EMR: N/A  Reviewed Allergies In EMR: N/A  Reviewed Surgeries / Procedures: N/A  Date of Onset of Symptoms: 01/23/2012  Treatments Tried: Robitussin MD  Treatments Tried Worked: No  Guideline(s) Used:  Cough  No Protocol Available - Sick Adult  Disposition Per Guideline:   Discuss with PCP and Callback by Nurse within 1 Hour  Reason For Disposition Reached:   Nursing judgment  Advice Given:  N/A

## 2012-01-29 ENCOUNTER — Telehealth: Payer: Self-pay | Admitting: Internal Medicine

## 2012-01-29 ENCOUNTER — Ambulatory Visit: Payer: Self-pay | Admitting: Internal Medicine

## 2012-01-29 NOTE — Telephone Encounter (Signed)
Patient Information:  Caller Name: Misty Stanley  Phone: [phone]  Patient: Cindy [name], Cindy [name]  Gender: Female  DOB: Oct 06, 1933  Age: 79 Years  PCP: Dale Wisdom  Office Follow Up:  Does the office need to follow up with this patient?: No  Instructions For The Office: N/A  RN Note:  Caller states she will call back if she is unable to get patient to come to the appt  Symptoms  Reason For Call & Symptoms: caller reports pt has been sick for 3 weeks and she cant get her breath good.  Caller states that she called yesterday and was advised for pt to be seen at the urgent care.  Caller states that she cannot get pt to go to the urgent care.  Caller states she is not with patient right now.  RN asked caller if she would come see Dr Lorin Picket in the office today.  Caller is going to contact patient to see if she would come in and see Dr Lorin Picket.  Caller states the she and her sister are going to try to get patient to the office today at 1015.  Appt made in EPIC  Reviewed Health History In EMR: No  Reviewed Medications In EMR: No  Reviewed Allergies In EMR: No  Reviewed Surgeries / Procedures: No  Date of Onset of Symptoms: 01/08/2012  Guideline(s) Used:  No Protocol Available - Sick Adult  Disposition Per Guideline:   See Today in Office  Reason For Disposition Reached:   Nursing judgment  Advice Given:  N/A  Appointment Scheduled:  01/29/2012 10:15:00 Appointment Scheduled Provider:  Dale Broadlands

## 2012-02-13 ENCOUNTER — Encounter: Payer: Self-pay | Admitting: Internal Medicine

## 2012-02-13 ENCOUNTER — Ambulatory Visit (INDEPENDENT_AMBULATORY_CARE_PROVIDER_SITE_OTHER): Payer: Medicare Other | Admitting: Internal Medicine

## 2012-02-13 VITALS — BP 140/76 | HR 80 | Temp 98.5°F | Ht 63.5 in | Wt 186.5 lb

## 2012-02-13 DIAGNOSIS — R5381 Other malaise: Secondary | ICD-10-CM

## 2012-02-13 DIAGNOSIS — I10 Essential (primary) hypertension: Secondary | ICD-10-CM

## 2012-02-13 DIAGNOSIS — C3490 Malignant neoplasm of unspecified part of unspecified bronchus or lung: Secondary | ICD-10-CM

## 2012-02-13 DIAGNOSIS — R269 Unspecified abnormalities of gait and mobility: Secondary | ICD-10-CM

## 2012-02-13 DIAGNOSIS — R5383 Other fatigue: Secondary | ICD-10-CM

## 2012-02-13 DIAGNOSIS — N289 Disorder of kidney and ureter, unspecified: Secondary | ICD-10-CM

## 2012-02-13 DIAGNOSIS — Z139 Encounter for screening, unspecified: Secondary | ICD-10-CM

## 2012-02-13 DIAGNOSIS — R2681 Unsteadiness on feet: Secondary | ICD-10-CM

## 2012-02-13 DIAGNOSIS — C679 Malignant neoplasm of bladder, unspecified: Secondary | ICD-10-CM

## 2012-02-13 DIAGNOSIS — E119 Type 2 diabetes mellitus without complications: Secondary | ICD-10-CM

## 2012-02-13 DIAGNOSIS — E7800 Pure hypercholesterolemia, unspecified: Secondary | ICD-10-CM

## 2012-02-14 ENCOUNTER — Encounter: Payer: Self-pay | Admitting: Internal Medicine

## 2012-02-14 MED ORDER — DOCUSATE SODIUM 100 MG PO CAPS: 100.0000 mg | ORAL_CAPSULE | Freq: Two times a day (BID) | ORAL | Status: DC

## 2012-02-14 MED ORDER — NYSTATIN 100000 UNIT/GM EX POWD: Freq: Two times a day (BID) | CUTANEOUS | Status: DC

## 2012-02-14 NOTE — Progress Notes (Signed)
Subjective:    Patient ID: Cindy [name], female    DOB: 05-07-1933, 79 y.o.   MRN: [id number]  HPI 79 year old female with past history of hypertension, hypercholesterolemia, COPD, lung cancer s/p left upper lobe lingulectomy 11/06 and bladder cancer (previously followed by Dr Achilles Dunk).  She comes in today to follow up on these issues as well as for her physical exam.  She is accompanied by her sister.  History obtained from both of them.  Has had an abdominal wall hernia for a while.  Over the last 6 months, she has noticed increased pain.  Went to the ER recently for increasing abdominal pain.  CT scan performed and she was informed that she needed to see surgery.  She saw Dr Felipa Evener and stated that "he was leaving it up to (her)" regarding having the surgery.  She still notices increased pain, but mainly when pressure is applied to the area.  She is taking stool softeners and bowels are moving.  No blood.  Eating and drinking.  No nausea or vomiting.  Not checking her sugars regularly.  Not watching what she eats.  Breathing is stable.  She is sob with exertion.  Not seeing her pulmonologist.  Has not see Dr Achilles Dunk recently.   Past Medical History  Diagnosis Date  . COPD (chronic obstructive pulmonary disease)   . Lung cancer     squamous cell s/p left upper lobe llingulectomy 12/08/04 - Dr Edwyna Shell  . Bladder cancer 2003    s/p resection and chemotherapy, followed by Dr Achilles Dunk  . Hypertension   . Hypercholesterolemia   . GERD (gastroesophageal reflux disease)   . Atrial fibrillation     post op, converted to NSR wtih amiodarone  . Hypothyroidism   . Inner ear disease   . Diabetes mellitus     Current Outpatient Prescriptions on File Prior to Visit  Medication Sig Dispense Refill  . aspirin 81 MG tablet Take 81 mg by mouth daily.      Marland Kitchen glimepiride (AMARYL) 2 MG tablet Take 2 mg by mouth daily.      Marland Kitchen losartan (COZAAR) 100 MG tablet Take 100 mg by mouth daily.      Marland Kitchen lovastatin (MEVACOR) 20  MG tablet Take 20 mg by mouth at bedtime.      . metoprolol succinate (TOPROL-XL) 50 MG 24 hr tablet Take 1 tablet (50 mg total) by mouth daily. Take with or immediately following a meal.  90 tablet  1  . nystatin cream (MYCOSTATIN) Apply topically 2 (two) times daily.  30 g  0  . tiotropium (SPIRIVA) 18 MCG inhalation capsule Place 18 mcg into inhaler and inhale daily.        Review of Systems Patient denies any headache, lightheadedness or dizziness. Is unsteady when she walks.  Stable.  No sinus or allergy symptoms.  No chest pain, tightness or palpitations.  Gets sob with exertion.  No significant change.   No nausea or vomiting.  No bowel change, such as diarrhea, constipation, BRBPR or melana.  States she takes the stool softeners and this helps to regulate her bowels.  Does have the increased pain with palpation over the hernia.   No urine change.  Discussed with her regarding surgery for the hernia.  She is not interested in pursuing.  Discussed risk of surgery.       Objective:   Physical Exam  Filed Vitals:   02/13/12 1125  BP: 140/76  Pulse: 80  Temp:  98.5 F (36.9 C)   Blood pressure recheck:  7/20  79 year old female in no acute distress.   HEENT:  Nares- clear.  Oropharynx - without lesions. NECK:  Supple.  Nontender.  No audible bruit.  HEART:  Appears to be regular. LUNGS:  No crackles or wheezing audible.  Respirations even and unlabored.  RADIAL PULSE:  Equal bilaterally.    BREASTS:  No nipple discharge or nipple retraction present.  Could not appreciate any distinct nodules or axillary adenopathy.  ABDOMEN:  Soft.  Bowel sounds present and normal.  No audible abdominal bruit.  Hernia present.  Increased tenderness to palpation over the hernia.  No abdominal distention.   GU:  Normal external genitalia.  Vaginal vault without lesions.  Cervix identified.  No lesions.  Pap not performed.  Could not appreciate any adnexal masses or tenderness.   RECTAL:  Heme  negative.   EXTREMITIES:  No increased edema present.  DP pulses palpable and equal bilaterally.              Assessment & Plan:  HERNIA.  Abdominal wall hernia.  Pain and exam as outlined.   Was evaluated in the ER.  CT performed.  Discussed at length with her today.  Discussed risk of surgery and risk of unrepaired hernia.  She is not interested in pursuing surgery at this time. Will follow.  Knows if any worsening change or problems, she is to be evaluated immediately.    UNSTEADY GAIT.  Has been an issue for a while.  No significant change.  See my previous [facility] notes for details regarding w/up.  Previous MRI 1/11 revealed some mild involutional changes otherwise negative.  Use walker.  Check B12.  Follow.    CARDIOVASCULAR.  Denies any chest pain, etc. Breathing stable.  Follow.   HEALTH MAINTENANCE.  Physical today.  Schedule mammogram.  She declines GI evaluation.

## 2012-02-14 NOTE — Assessment & Plan Note (Signed)
Breathing stable per her report.  Gets sob with exertion.  Declined referral or further testing at this time.

## 2012-02-14 NOTE — Assessment & Plan Note (Signed)
Overdue follow up with Dr Achilles Dunk.  Schedule appt.

## 2012-02-14 NOTE — Assessment & Plan Note (Signed)
Noncompliant with diet.  Does not check her sugars.  On amaryl.  No significant lows reported.  Have her check her sugars.  Record.  Send in.  Adjust medication if necessary.  Diabetic diet.  Check metabolic panel and a1c.

## 2012-02-14 NOTE — Assessment & Plan Note (Signed)
Low cholesterol diet and exercise.  Check lipid panel and liver function. On lovastatin.

## 2012-02-14 NOTE — Assessment & Plan Note (Signed)
Check metabolic panel to confirm stable.    

## 2012-02-14 NOTE — Assessment & Plan Note (Signed)
Blood pressure doing well.  Check metabolic panel.  Same medication regimen.

## 2012-05-21 ENCOUNTER — Other Ambulatory Visit: Payer: Self-pay | Admitting: Internal Medicine

## 2012-05-21 MED ORDER — TIOTROPIUM BROMIDE MONOHYDRATE 18 MCG IN CAPS: 18.0000 ug | ORAL_CAPSULE | Freq: Every day | RESPIRATORY_TRACT | Status: DC

## 2012-05-21 NOTE — Telephone Encounter (Signed)
Please Advise..... Refill  Docusate Sodium (Colace) 100mg  Take one capsule by mouth twice a day #60 1rf  [facility] - [address]

## 2012-05-21 NOTE — Progress Notes (Signed)
Refilled spiriva x 3 months total.

## 2012-06-12 ENCOUNTER — Ambulatory Visit: Payer: Medicare Other | Admitting: Internal Medicine

## 2012-06-16 ENCOUNTER — Inpatient Hospital Stay: Payer: Self-pay | Admitting: Internal Medicine

## 2012-06-16 LAB — COMPREHENSIVE METABOLIC PANEL
Alkaline Phosphatase: 114 U/L (ref 50–136)
Anion Gap: 9 (ref 7–16)
BUN: 15 mg/dL (ref 7–18)
Chloride: 101 mmol/L (ref 98–107)
EGFR (Non-African Amer.): >60
Osmolality: 286 (ref 275–301)
Potassium: 4.5 mmol/L (ref 3.5–5.1)
SGOT(AST): 43 U/L — ABNORMAL HIGH (ref 15–37)
SGPT (ALT): 33 U/L (ref 12–78)

## 2012-06-16 LAB — URINALYSIS, COMPLETE
Bilirubin,UR: NEGATIVE
Blood: NEGATIVE
Leukocyte Esterase: NEGATIVE
Protein: NEGATIVE
RBC,UR: 1 /HPF (ref 0–5)
Squamous Epithelial: 1
WBC UR: <1 /HPF (ref 0–5)

## 2012-06-16 LAB — CBC
HCT: 46.9 % (ref 35.0–47.0)
HGB: 15.4 g/dL (ref 12.0–16.0)
RBC: 5.4 10*6/uL — ABNORMAL HIGH (ref 3.80–5.20)
WBC: 13 10*3/uL — ABNORMAL HIGH (ref 3.6–11.0)

## 2012-06-16 LAB — TROPONIN I: Troponin-I: <0.02 ng/mL

## 2012-06-17 ENCOUNTER — Ambulatory Visit: Payer: Medicare Other | Admitting: Internal Medicine

## 2012-06-17 ENCOUNTER — Telehealth: Payer: Self-pay | Admitting: Internal Medicine

## 2012-06-17 LAB — CBC WITH DIFFERENTIAL/PLATELET
Basophil #: 0 10*3/uL (ref 0.0–0.1)
Basophil %: 0.1 %
Eosinophil #: 0 10*3/uL (ref 0.0–0.7)
Eosinophil %: 0 %
HGB: 12.9 g/dL (ref 12.0–16.0)
Lymphocyte #: 0.9 10*3/uL — ABNORMAL LOW (ref 1.0–3.6)
MCH: 28.8 pg (ref 26.0–34.0)
MCHC: 33.4 g/dL (ref 32.0–36.0)
MCV: 86 fL (ref 80–100)
Monocyte #: 0.4 x10 3/mm (ref 0.2–0.9)
Monocyte %: 2.7 %
Neutrophil #: 12.6 10*3/uL — ABNORMAL HIGH (ref 1.4–6.5)
Neutrophil %: 90.7 %
Platelet: 281 10*3/uL (ref 150–440)
RBC: 4.49 10*6/uL (ref 3.80–5.20)

## 2012-06-17 LAB — BASIC METABOLIC PANEL
Co2: 26 mmol/L (ref 21–32)
Creatinine: 1.06 mg/dL (ref 0.60–1.30)
EGFR (African American): 59 — ABNORMAL LOW
Glucose: 386 mg/dL — ABNORMAL HIGH (ref 65–99)
Sodium: 139 mmol/L (ref 136–145)

## 2012-06-17 NOTE — Telephone Encounter (Signed)
Noted.  Notify her daughter Cranford Mon to keep [place] posted).

## 2012-06-17 NOTE — Telephone Encounter (Signed)
Patient's daughter called to inform Dr. Lorin Picket that Cindy [name] is in the hospital.

## 2012-06-18 NOTE — Telephone Encounter (Signed)
Spoke with daughter Misty Stanley). She will keep you posted & I asked her to call me & let me know when Ms. Anschutz is discharged so I can request records. FYI:  Her hands are bandaged from the fall. Her BP & sugars seems to be under control right now. And they are trying to place her in a home for a while (at least).

## 2012-06-20 ENCOUNTER — Telehealth: Payer: Self-pay | Admitting: *Deleted

## 2012-06-20 NOTE — Telephone Encounter (Signed)
Daughter Misty Stanley) called to report that Miki was discharged yesterday (records requested) & she is currently at [facility]. However, they are not pleased with the facility. She went to PT this morning & she was left in the bathroom. When she got out, she had vomit all over her and she had to change herself. When she pushed the button for help nobody responded & Misty Stanley had to reset the button herself. Misty Stanley feels that she is not being watched, responded to, or taken care of there & she wants to take her home. Pt is currently in their overflow room & they told her she would be in there for a few days possibly.

## 2012-06-20 NOTE — Telephone Encounter (Signed)
I would recommend she let the staff know of her concerns (and the supervisor).  Can see if they offer suggestions - so that she can continue to get the therapy they can give.  Can keep [place] posted.

## 2012-06-20 NOTE — Telephone Encounter (Signed)
She has already reported her issues to the supervisor. They stated that they were unaware of the situations. Hopefully it will be addressed.

## 2012-07-03 ENCOUNTER — Emergency Department: Payer: Self-pay | Admitting: Emergency Medicine

## 2012-07-03 LAB — MAGNESIUM: Magnesium: 1.1 mg/dL — ABNORMAL LOW

## 2012-07-03 LAB — CBC
HCT: 40.1 % (ref 35.0–47.0)
HGB: 13.4 g/dL (ref 12.0–16.0)
MCH: 28.8 pg (ref 26.0–34.0)
Platelet: 197 10*3/uL (ref 150–440)
RBC: 4.65 10*6/uL (ref 3.80–5.20)
RDW: 13.6 % (ref 11.5–14.5)
WBC: 8 10*3/uL (ref 3.6–11.0)

## 2012-07-03 LAB — CK TOTAL AND CKMB (NOT AT ARMC): CK, Total: 36 U/L (ref 21–215)

## 2012-07-03 LAB — COMPREHENSIVE METABOLIC PANEL
Albumin: 3 g/dL — ABNORMAL LOW (ref 3.4–5.0)
Anion Gap: 10 (ref 7–16)
BUN: 15 mg/dL (ref 7–18)
Bilirubin,Total: 0.4 mg/dL (ref 0.2–1.0)
Calcium, Total: 8.6 mg/dL (ref 8.5–10.1)
Chloride: 106 mmol/L (ref 98–107)
Creatinine: 0.9 mg/dL (ref 0.60–1.30)
Osmolality: 285 (ref 275–301)
SGOT(AST): 14 U/L — ABNORMAL LOW (ref 15–37)
Total Protein: 6.2 g/dL — ABNORMAL LOW (ref 6.4–8.2)

## 2012-07-03 LAB — TROPONIN I: Troponin-I: <0.02 ng/mL

## 2012-07-10 ENCOUNTER — Telehealth: Payer: Self-pay | Admitting: Internal Medicine

## 2012-07-10 NOTE — Telephone Encounter (Signed)
Daughter called in today stating her mom has came home Tuesday from  [facility].  She said that you had asked her to keep you informed on her mom, she is coming in to see you on 07/28/12.

## 2012-07-10 NOTE — Telephone Encounter (Signed)
Just an FYI (see note)

## 2012-07-14 ENCOUNTER — Ambulatory Visit (INDEPENDENT_AMBULATORY_CARE_PROVIDER_SITE_OTHER): Payer: Medicare Other | Admitting: Internal Medicine

## 2012-07-14 ENCOUNTER — Encounter: Payer: Self-pay | Admitting: Internal Medicine

## 2012-07-14 ENCOUNTER — Telehealth: Payer: Self-pay | Admitting: Internal Medicine

## 2012-07-14 VITALS — BP 148/78 | HR 71 | Temp 97.7°F | Wt 170.0 lb

## 2012-07-14 DIAGNOSIS — B3789 Other sites of candidiasis: Secondary | ICD-10-CM | POA: Insufficient documentation

## 2012-07-14 DIAGNOSIS — L259 Unspecified contact dermatitis, unspecified cause: Secondary | ICD-10-CM

## 2012-07-14 DIAGNOSIS — L309 Dermatitis, unspecified: Secondary | ICD-10-CM

## 2012-07-14 MED ORDER — NYSTATIN 100000 UNIT/GM EX POWD: Freq: Two times a day (BID) | CUTANEOUS | Status: DC

## 2012-07-14 MED ORDER — TRIAMCINOLONE ACETONIDE 0.1 % EX CREA: TOPICAL_CREAM | Freq: Two times a day (BID) | CUTANEOUS | Status: DC

## 2012-07-14 NOTE — Telephone Encounter (Signed)
ok 

## 2012-07-14 NOTE — Telephone Encounter (Signed)
The Pt called to report that she has seen & evaluated Ms. Cindy [name]. She would like to see her 3 x's a week for one week, and 2 x's a week for 3 weeks. Needs a verbal order to see patient. Please advise

## 2012-07-14 NOTE — Telephone Encounter (Signed)
FYI

## 2012-07-14 NOTE — Progress Notes (Signed)
Subjective:    Patient ID: Cindy [name], female    DOB: 05-19-1933, 79 y.o.   MRN: [id number]  HPI 79YO female with h/o COPD, DM, HTN, HL presents for acute visit. She is concerned about an itchy rash over her right upper arm, back, and abdomen. She first noticed the rash when she was in rehabilitation at [facility] 3 weeks ago. It started on her right upper arm. She denies any exposure to new lotions, detergents, perfumes. She did recently start metformin which is new for her. The rash gradually spread up her arm and across her back. It is described as extremely itchy and keeps her awake at night. She has been taking Benadryl at night with minimal improvement. After coming home, family member also tried applying topical triple antibiotic ointment with no improvement. She denies any other symptoms such as fever, chills, change in bowel habits. She is not taking any supplemental medications. She has never had a rash like this. She has had a history of rash under her breasts particularly during the summer months which has improved with the use of nystatin powder or cream.  Outpatient Encounter Prescriptions as of 07/14/2012  Medication Sig Dispense Refill  . aspirin 81 MG tablet Take 81 mg by mouth daily.      Marland Kitchen docusate sodium (COLACE) 100 MG capsule TAKE ONE CAPSULE BY MOUTH TWICE A DAY  60 capsule  1  . fluticasone-salmeterol (ADVAIR HFA) 115-21 MCG/ACT inhaler Inhale 2 puffs into the lungs 2 (two) times daily.      Marland Kitchen glimepiride (AMARYL) 2 MG tablet Take 2 mg by mouth daily.      Marland Kitchen losartan (COZAAR) 100 MG tablet Take 100 mg by mouth daily.      Marland Kitchen lovastatin (MEVACOR) 20 MG tablet Take 20 mg by mouth at bedtime.      . metoprolol succinate (TOPROL-XL) 50 MG 24 hr tablet Take 1 tablet (50 mg total) by mouth daily. Take with or immediately following a meal.  90 tablet  1  . nystatin (MYCOSTATIN) powder Apply topically 2 (two) times daily.  60 g  2  . nystatin cream (MYCOSTATIN) Apply topically 2  (two) times daily.  30 g  0  . tiotropium (SPIRIVA) 18 MCG inhalation capsule Place 1 capsule (18 mcg total) into inhaler and inhale daily.  30 capsule  2  . [DISCONTINUED] nystatin (MYCOSTATIN) powder Apply topically 2 (two) times daily.  60 g  0  . metFORMIN (GLUCOPHAGE) 500 MG tablet Take 500 mg by mouth 2 (two) times daily with a meal.       No facility-administered encounter medications on file as of 07/14/2012.   BP 148/78  Pulse 71  Temp(Src) 97.7 F (36.5 C) (Oral)  Wt 170 lb (77.111 kg)  BMI 29.64 kg/m2  SpO2 95%  Review of Systems  Constitutional: Negative for fever, chills, appetite change, fatigue and unexpected weight change.  HENT: Negative for ear pain, congestion, sore throat, trouble swallowing, neck pain, voice change and sinus pressure.   Eyes: Negative for visual disturbance.  Respiratory: Negative for cough, shortness of breath, wheezing and stridor.   Cardiovascular: Negative for chest pain, palpitations and leg swelling.  Gastrointestinal: Negative for nausea, vomiting, abdominal pain, diarrhea, constipation, blood in stool, abdominal distention and anal bleeding.  Genitourinary: Negative for dysuria and flank pain.  Musculoskeletal: Positive for myalgias. Negative for arthralgias and gait problem.  Skin: Positive for color change and rash.  Neurological: Negative for dizziness and headaches.  Hematological:  Negative for adenopathy. Does not bruise/bleed easily.  Psychiatric/Behavioral: Negative for suicidal ideas, sleep disturbance and dysphoric mood. The patient is not nervous/anxious.        Objective:   Physical Exam  Constitutional: She is oriented to person, place, and time. She appears well-developed and well-nourished. No distress.  HENT:  Head: Normocephalic and atraumatic.  Right Ear: External ear normal.  Left Ear: External ear normal.  Nose: Nose normal.  Mouth/Throat: Oropharynx is clear and moist. No oropharyngeal exudate.  Eyes:  Conjunctivae are normal. Pupils are equal, round, and reactive to light. Right eye exhibits no discharge. Left eye exhibits no discharge. No scleral icterus.  Neck: Normal range of motion. Neck supple. No tracheal deviation present. No thyromegaly present.  Cardiovascular: Normal rate, regular rhythm, normal heart sounds and intact distal pulses.  Exam reveals no gallop and no friction rub.   No murmur heard. Pulmonary/Chest: Effort normal and breath sounds normal. No accessory muscle usage. Not tachypneic. No respiratory distress. She has no decreased breath sounds. She has no wheezes. She has no rhonchi. She has no rales. She exhibits no tenderness.  Musculoskeletal: Normal range of motion. She exhibits no edema and no tenderness.  Lymphadenopathy:    She has no cervical adenopathy.  Neurological: She is alert and oriented to person, place, and time. No cranial nerve deficit. She exhibits normal muscle tone. Coordination normal.  Skin: Skin is warm and dry. Rash noted. She is not diaphoretic. There is erythema. No pallor.     Psychiatric: She has a normal mood and affect. Her behavior is normal. Judgment and thought content normal.          Assessment & Plan:

## 2012-07-14 NOTE — Assessment & Plan Note (Signed)
Erythematous, maculopapular,pruritic rash most consistent with dermatitis. Unclear allergen. Will try applying topical triamcinolone cream. Given that she recently started metformin, we'll also try holding this medication for couple of days. She will call with an update in 2-3 days. If no improvement, would favor biopsy by dermatology.

## 2012-07-14 NOTE — Telephone Encounter (Signed)
Patient Information:  Caller Name: Alexia Freestone  Phone: [phone]  Patient: Cindy [name], Cindy [name]  Gender: Female  DOB: 01-10-1934  Age: 79 Years  PCP: Dale Owasa  Office Follow Up:  Does the office need to follow up with this patient?: No  Instructions For The Office: N/A  RN Note:  Advised to hold Benadryl until after sees MD. Dr Lorin Picket has no more appointments. Unable to get patient ready and to office for next available appointment at 1130 07/14/12 so scheduled for next available opening this afternoon.   Symptoms  Reason For Call & Symptoms: Itchy, rash, "like welts, or hives" that began on right arm and spread to breasts.  Sister not present for triage. FBS unknown.  Reviewed Health History In EMR: Yes  Reviewed Medications In EMR: Yes  Reviewed Allergies In EMR: Yes  Reviewed Surgeries / Procedures: Yes  Date of Onset of Symptoms: 07/01/2012  Treatments Tried: Benadryl  Treatments Tried Worked: No  Guideline(s) Used:  Hives  Disposition Per Guideline:   See Today or Tomorrow in Office  Reason For Disposition Reached:   Hives persist > 1 week  Advice Given:  Food Related Hives:  Hives from foods usually disappear within 6 hours.  Hydrocortisone Cream:  For very itchy spots, apply hydrocortisone cream 4 times a day as needed.  Patient Will Follow Care Advice:  YES  Appointment Scheduled:  07/14/2012 14:15:00 Appointment Scheduled Provider:  Ronna Polio (Adults only)

## 2012-07-14 NOTE — Assessment & Plan Note (Signed)
Recurrent candidiasis over skin underneath bilateral breasts. Will start Nystatin powder bid. Follow up 2 weeks.

## 2012-07-14 NOTE — Patient Instructions (Signed)
Stop Metformin for next 2-3 days. Start Triamcinolone cream over rash twice daily.  Monitor symptoms. Call with update on Thursday.

## 2012-07-15 NOTE — Telephone Encounter (Signed)
Verbal order for PT was called to [facility] with [facility]

## 2012-07-17 ENCOUNTER — Telehealth: Payer: Self-pay | Admitting: Internal Medicine

## 2012-07-17 NOTE — Telephone Encounter (Signed)
Informed Cindy [name] and she verbally agreed. She will restart her Metformin since there was no change in the rash.

## 2012-07-17 NOTE — Telephone Encounter (Signed)
It is fine for her to continue to use the triamcinolone cream and then followup with Dr. Lorin Picket as scheduled.

## 2012-07-17 NOTE — Telephone Encounter (Signed)
Sister Patty left a message with an update on her rash, it looks like it is doing a little better and she is still using the cream. You want them to continue using the cream or refer her to dermatologist.

## 2012-07-21 ENCOUNTER — Telehealth: Payer: Self-pay | Admitting: Internal Medicine

## 2012-07-21 MED ORDER — INSULIN PEN NEEDLE 32G X 4 MM MISC: Status: DC

## 2012-07-21 NOTE — Telephone Encounter (Signed)
Needing Nova pen needles called into the pharmacy.

## 2012-07-24 ENCOUNTER — Telehealth: Payer: Self-pay | Admitting: *Deleted

## 2012-07-24 DIAGNOSIS — R21 Rash and other nonspecific skin eruption: Secondary | ICD-10-CM

## 2012-07-24 MED ORDER — FLUTICASONE-SALMETEROL 250-50 MCG/DOSE IN AEPB: 1.0000 | INHALATION_SPRAY | Freq: Two times a day (BID) | RESPIRATORY_TRACT | Status: DC

## 2012-07-24 NOTE — Telephone Encounter (Signed)
Daughter Cindy [name]) called to report that she has sent/left several messages regarding her mothers rash. She has recently been seen & treated by Dr. Dan [name] and her Rash is no better & itching. She also mentioned that [facility] had her Metformin dosage at 1,000 BID. I informed Cindy [name] that it should be 500mg  BID. She states that they will just cut it in half. Pt would like advise on what to do about the rash.

## 2012-07-24 NOTE — Telephone Encounter (Signed)
Order placed for dermatology referral.  

## 2012-07-24 NOTE — Telephone Encounter (Signed)
See my chart message.  If persistent rash, will need dermatology evaluation.  Can let shannon know and see if can get appt today.  Thanks.

## 2012-07-24 NOTE — Telephone Encounter (Signed)
Pt's daughter would like to proceed with dermatology referral asap

## 2012-07-28 ENCOUNTER — Ambulatory Visit: Payer: Medicare Other | Admitting: Internal Medicine

## 2012-08-14 ENCOUNTER — Ambulatory Visit (INDEPENDENT_AMBULATORY_CARE_PROVIDER_SITE_OTHER): Payer: Medicare Other | Admitting: Internal Medicine

## 2012-08-14 ENCOUNTER — Encounter: Payer: Self-pay | Admitting: Internal Medicine

## 2012-08-14 VITALS — BP 130/70 | HR 78 | Temp 98.7°F | Ht 63.5 in | Wt 168.2 lb

## 2012-08-14 DIAGNOSIS — N289 Disorder of kidney and ureter, unspecified: Secondary | ICD-10-CM

## 2012-08-14 DIAGNOSIS — C679 Malignant neoplasm of bladder, unspecified: Secondary | ICD-10-CM

## 2012-08-14 DIAGNOSIS — E7800 Pure hypercholesterolemia, unspecified: Secondary | ICD-10-CM

## 2012-08-14 DIAGNOSIS — E119 Type 2 diabetes mellitus without complications: Secondary | ICD-10-CM

## 2012-08-14 DIAGNOSIS — L309 Dermatitis, unspecified: Secondary | ICD-10-CM

## 2012-08-14 DIAGNOSIS — I10 Essential (primary) hypertension: Secondary | ICD-10-CM

## 2012-08-14 DIAGNOSIS — L259 Unspecified contact dermatitis, unspecified cause: Secondary | ICD-10-CM

## 2012-08-15 ENCOUNTER — Telehealth: Payer: Self-pay | Admitting: *Deleted

## 2012-08-15 LAB — BASIC METABOLIC PANEL
BUN: 9 mg/dL (ref 6–23)
Chloride: 104 mEq/L (ref 96–112)
Glucose, Bld: 201 mg/dL — ABNORMAL HIGH (ref 70–99)
Potassium: 3.8 mEq/L (ref 3.5–5.1)
Sodium: 140 mEq/L (ref 135–145)

## 2012-08-15 LAB — CBC WITH DIFFERENTIAL/PLATELET
Basophils Absolute: 0.1 10*3/uL (ref 0.0–0.1)
Eosinophils Absolute: 0.3 10*3/uL (ref 0.0–0.7)
HCT: 41.3 % (ref 36.0–46.0)
Lymphs Abs: 3.1 10*3/uL (ref 0.7–4.0)
MCHC: 32.8 g/dL (ref 30.0–36.0)
Monocytes Absolute: 0.8 10*3/uL (ref 0.1–1.0)
Monocytes Relative: 7.2 % (ref 3.0–12.0)
Platelets: 403 10*3/uL — ABNORMAL HIGH (ref 150.0–400.0)
RDW: 14.9 % — ABNORMAL HIGH (ref 11.5–14.6)

## 2012-08-15 LAB — HEPATIC FUNCTION PANEL
AST: 17 U/L (ref 0–37)
Albumin: 3.7 g/dL (ref 3.5–5.2)
Alkaline Phosphatase: 74 U/L (ref 39–117)
Bilirubin, Direct: 0.1 mg/dL (ref 0.0–0.3)

## 2012-08-15 LAB — TSH: TSH: 1.17 u[IU]/mL (ref 0.35–5.50)

## 2012-08-15 LAB — HEMOGLOBIN A1C: Hgb A1c MFr Bld: 8.6 % — ABNORMAL HIGH (ref 4.6–6.5)

## 2012-08-15 NOTE — Telephone Encounter (Signed)
Pt's daughter Alexia Freestone) called back to give you the information you requested at yesterday's visit. She is currently on Glimepiride 2mg  BID. She stated that she has about a month worth left. Hoever, based on our records we have here taking it once a day. She has been on BID for a while (based on her medication history it looks she was on BID)-please advise.

## 2012-08-17 ENCOUNTER — Encounter: Payer: Self-pay | Admitting: Internal Medicine

## 2012-08-17 NOTE — Assessment & Plan Note (Signed)
Blood pressure doing well.  Check metabolic panel.  Same medication regimen.

## 2012-08-17 NOTE — Assessment & Plan Note (Signed)
Erythematous, maculopapular,pruritic rash most consistent with dermatitis. Unclear allergen.  Restart triamcinolone cream.  Given that she recently restarted metformin, we'll also try holding this medication and see if resolves.  Allegra as discussed.  If persistent, will require f/u with dermatology.

## 2012-08-17 NOTE — Assessment & Plan Note (Signed)
Check metabolic panel to confirm stable.

## 2012-08-17 NOTE — Assessment & Plan Note (Signed)
Breathing stable.  CXR in the hospital revealed no acute abnormality.  Follow.

## 2012-08-17 NOTE — Progress Notes (Signed)
Subjective:    Patient ID: Cindy [name], female    DOB: 1933/06/30, 79 y.o.   MRN: [id number]  HPI 79 year old female with past history of hypertension, hypercholesterolemia, COPD, lung cancer s/p left upper lobe lingulectomy 11/06 and bladder cancer (previously followed by Dr Achilles Dunk).  She comes in today for a scheduled follow up.   She is accompanied by her sister.  History obtained from both of them.  She was admitted 06/16/12-06/19/12 with shortness of breath and COPD exacerbation.  Found to have elevated blood sugars.  Was started on insulin and metformin.  She is not using insulin now.  Is back on metformin.  Was off metformin for a couple of days secondary to a rash.  She was seen recently with itching and rash.  Unclear etiology.  Recommended to stop metformin since new (to confirm not causing the rash).  Saw dermatology.  Apparently gave her the same cream (TCC).  Back on metformin now and the rash and itching has returned.  No other new exposures.  Not checking her sugars.  Taking amaryl also.  Unsure of the dose.  Trying to cut back on bread.  Breathing is stable.  No acid reflux.  No nausea or vomiting.  Just saw Dr Achilles Dunk.  Cystoscopy - ok.     Past Medical History  Diagnosis Date  . COPD (chronic obstructive pulmonary disease)   . Lung cancer     squamous cell s/p left upper lobe llingulectomy 12/08/04 - Dr Edwyna Shell  . Bladder cancer 2003    s/p resection and chemotherapy, followed by Dr Achilles Dunk  . Hypertension   . Hypercholesterolemia   . GERD (gastroesophageal reflux disease)   . Atrial fibrillation     post op, converted to NSR wtih amiodarone  . Hypothyroidism   . Inner ear disease   . Diabetes mellitus     Current Outpatient Prescriptions on File Prior to Visit  Medication Sig Dispense Refill  . aspirin 81 MG tablet Take 81 mg by mouth daily.      Marland Kitchen docusate sodium (COLACE) 100 MG capsule TAKE ONE CAPSULE BY MOUTH TWICE A DAY  60 capsule  1  . Fluticasone-Salmeterol (ADVAIR  DISKUS) 250-50 MCG/DOSE AEPB Inhale 1 puff into the lungs 2 (two) times daily.  60 each  5  . glimepiride (AMARYL) 2 MG tablet Take 2 mg by mouth daily.      Marland Kitchen losartan (COZAAR) 100 MG tablet Take 100 mg by mouth daily.      Marland Kitchen lovastatin (MEVACOR) 20 MG tablet Take 20 mg by mouth at bedtime.      . metFORMIN (GLUCOPHAGE) 500 MG tablet Take 500 mg by mouth 2 (two) times daily with a meal.      . metoprolol succinate (TOPROL-XL) 50 MG 24 hr tablet Take 1 tablet (50 mg total) by mouth daily. Take with or immediately following a meal.  90 tablet  1  . nystatin (MYCOSTATIN) powder Apply topically 2 (two) times daily.  60 g  2  . nystatin cream (MYCOSTATIN) Apply topically 2 (two) times daily.  30 g  0  . tiotropium (SPIRIVA) 18 MCG inhalation capsule Place 1 capsule (18 mcg total) into inhaler and inhale daily.  30 capsule  2  . triamcinolone cream (KENALOG) 0.1 % Apply topically 2 (two) times daily.  454 g  1  . Insulin Pen Needle (CLICKFINE PEN NEEDLES) 32G X 4 MM MISC Novolog FlexPen Needles-use twice a day  100 each  11  No current facility-administered medications on file prior to visit.    Review of Systems Patient denies any headache, lightheadedness or dizziness. Is unsteady when she walks.  Stable.  No sinus or allergy symptoms.  No chest pain, tightness or palpitations.  Breathing stable.   No nausea or vomiting.  No bowel change, such as diarrhea, constipation, BRBPR or melana.  States she takes the stool softeners and this helps to regulate her bowels.  No urine change.  Not checking her sugars.  Has cut down on bread intake.         Objective:   Physical Exam  Filed Vitals:   08/14/12 1424  BP: 130/70  Pulse: 78  Temp: 98.7 F (37.1 C)   Blood pressure recheck:  48/56  79 year old female in no acute distress.   HEENT:  Nares- clear.  Oropharynx - without lesions. NECK:  Supple.  Nontender.  No audible bruit.  HEART:  Appears to be regular. LUNGS:  No crackles or wheezing  audible.  Respirations even and unlabored.  RADIAL PULSE:  Equal bilaterally.  ABDOMEN:  Soft.  Bowel sounds present and normal.  No audible abdominal bruit.  Hernia present.  Increased tenderness to palpation over the hernia.  No abdominal distention.    EXTREMITIES:  No increased edema present.  DP pulses palpable and equal bilaterally.              Assessment & Plan:  HERNIA.  Abdominal wall hernia.  Saw surgery.  Decided against surgery.  Bowels ok.    UNSTEADY GAIT.  Has been an issue for a while.  No significant change.  See my previous [facility] notes for details regarding w/up.  Previous MRI 1/11 revealed some mild involutional changes otherwise negative.  Use walker.      CARDIOVASCULAR.  Denies any chest pain, etc. Breathing stable.  Follow.   HEALTH MAINTENANCE.  Physical 02/13/12.  Was previously scheduled for a mammogram.  Apparently not done.  Will need to reschedule.    She declines GI evaluation.

## 2012-08-17 NOTE — Assessment & Plan Note (Signed)
Noncompliant with diet.  Does not check her sugars.  On amaryl. Will call back with confirmation of dose.  No significant lows reported.  Have her check her sugars.  Record.  Send in.  Adjust medication.  Will stop metformin.  Unable to use insulin at home.  Get her back in soon to reassess.  Check metabolic panel and a1c.

## 2012-08-17 NOTE — Assessment & Plan Note (Signed)
Just saw Dr Achilles Dunk.  Cystoscopy ok.  Recommended follow up in one year.

## 2012-08-17 NOTE — Assessment & Plan Note (Signed)
Low cholesterol diet and exercise.  Check liver function. On lovastatin.

## 2012-08-18 NOTE — Telephone Encounter (Signed)
Noted.  See lab messaging.

## 2012-08-25 ENCOUNTER — Inpatient Hospital Stay: Payer: Self-pay | Admitting: Internal Medicine

## 2012-08-25 LAB — BASIC METABOLIC PANEL
BUN: 15 mg/dL (ref 7–18)
Co2: 27 mmol/L (ref 21–32)
Potassium: 3.9 mmol/L (ref 3.5–5.1)

## 2012-08-25 LAB — CBC
MCH: 29 pg (ref 26.0–34.0)
Platelet: 349 10*3/uL (ref 150–440)
RDW: 14.6 % — ABNORMAL HIGH (ref 11.5–14.5)
WBC: 15.7 10*3/uL — ABNORMAL HIGH (ref 3.6–11.0)

## 2012-08-25 LAB — PROTIME-INR
INR: 1
Prothrombin Time: 13.7 secs (ref 11.5–14.7)

## 2012-08-25 LAB — URINALYSIS, COMPLETE
Bacteria: NONE SEEN
Glucose,UR: >=500 mg/dL (ref 0–75)
Nitrite: NEGATIVE
Ph: 5 (ref 4.5–8.0)
RBC,UR: <1 /HPF (ref 0–5)
WBC UR: <1 /HPF (ref 0–5)

## 2012-08-26 HISTORY — PX: ORIF HIP FRACTURE: SHX2125

## 2012-08-26 LAB — BASIC METABOLIC PANEL
Anion Gap: 7 (ref 7–16)
BUN: 16 mg/dL (ref 7–18)
Chloride: 105 mmol/L (ref 98–107)
EGFR (Non-African Amer.): >60
Glucose: 278 mg/dL — ABNORMAL HIGH (ref 65–99)
Osmolality: 287 (ref 275–301)
Sodium: 138 mmol/L (ref 136–145)

## 2012-08-26 LAB — CBC WITH DIFFERENTIAL/PLATELET
Basophil %: 0.3 %
Eosinophil #: 0 10*3/uL (ref 0.0–0.7)
Eosinophil %: 0.1 %
MCHC: 34 g/dL (ref 32.0–36.0)
Monocyte #: 1.5 x10 3/mm — ABNORMAL HIGH (ref 0.2–0.9)
Monocyte %: 10.5 %
Neutrophil %: 70.6 %
RBC: 4.02 10*6/uL (ref 3.80–5.20)
RDW: 14.7 % — ABNORMAL HIGH (ref 11.5–14.5)
WBC: 14 10*3/uL — ABNORMAL HIGH (ref 3.6–11.0)

## 2012-08-27 LAB — CBC WITH DIFFERENTIAL/PLATELET
Basophil #: 0.1 10*3/uL (ref 0.0–0.1)
Basophil %: 0.4 %
Eosinophil %: 0.8 %
HGB: 10.6 g/dL — ABNORMAL LOW (ref 12.0–16.0)
Lymphocyte #: 2.7 10*3/uL (ref 1.0–3.6)
MCV: 87 fL (ref 80–100)
Monocyte #: 1.5 x10 3/mm — ABNORMAL HIGH (ref 0.2–0.9)
Monocyte %: 11.7 %
Neutrophil %: 66.1 %
RBC: 3.59 10*6/uL — ABNORMAL LOW (ref 3.80–5.20)
WBC: 13 10*3/uL — ABNORMAL HIGH (ref 3.6–11.0)

## 2012-08-27 LAB — BASIC METABOLIC PANEL
Anion Gap: 9 (ref 7–16)
Calcium, Total: 8.1 mg/dL — ABNORMAL LOW (ref 8.5–10.1)
Chloride: 105 mmol/L (ref 98–107)
Creatinine: 0.92 mg/dL (ref 0.60–1.30)
EGFR (African American): >60
Osmolality: 288 (ref 275–301)
Potassium: 3.7 mmol/L (ref 3.5–5.1)

## 2012-08-28 LAB — URINALYSIS, COMPLETE
Bilirubin,UR: NEGATIVE
Glucose,UR: 150 mg/dL (ref 0–75)
Hyaline Cast: 7
Nitrite: NEGATIVE
Ph: 5 (ref 4.5–8.0)
RBC,UR: 54 /HPF (ref 0–5)
Squamous Epithelial: <1
WBC UR: 6 /HPF (ref 0–5)

## 2012-08-29 LAB — CBC WITH DIFFERENTIAL/PLATELET
Basophil #: 0.1 10*3/uL (ref 0.0–0.1)
Basophil %: 0.9 %
Eosinophil #: 0.3 10*3/uL (ref 0.0–0.7)
Eosinophil %: 3.2 %
HCT: 29.1 % — ABNORMAL LOW (ref 35.0–47.0)
Lymphocyte %: 23 %
MCH: 30.2 pg (ref 26.0–34.0)
MCHC: 34.6 g/dL (ref 32.0–36.0)
MCV: 87 fL (ref 80–100)
Monocyte #: 0.9 x10 3/mm (ref 0.2–0.9)
Monocyte %: 9.6 %
Neutrophil #: 6.2 10*3/uL (ref 1.4–6.5)
Neutrophil %: 63.3 %
RBC: 3.33 10*6/uL — ABNORMAL LOW (ref 3.80–5.20)

## 2012-09-08 ENCOUNTER — Telehealth: Payer: Self-pay | Admitting: Internal Medicine

## 2012-09-08 DIAGNOSIS — R21 Rash and other nonspecific skin eruption: Secondary | ICD-10-CM

## 2012-09-08 DIAGNOSIS — L299 Pruritus, unspecified: Secondary | ICD-10-CM

## 2012-09-08 NOTE — Telephone Encounter (Signed)
Calling again to find out if pt can be sent to an allergist.  States pt was sent to a dermatologist and was given Atarax for itching but the pt has not gotten relief.

## 2012-09-08 NOTE — Telephone Encounter (Signed)
See below

## 2012-09-08 NOTE — Telephone Encounter (Signed)
The patient's daughter called wanting the patient to be referred to an allergist. She also wanted to discuss some things with Dr. Lorin Picket.

## 2012-09-09 ENCOUNTER — Encounter: Payer: Self-pay | Admitting: Internal Medicine

## 2012-09-09 NOTE — Telephone Encounter (Signed)
Daughter notified 

## 2012-09-09 NOTE — Telephone Encounter (Signed)
Notify Misty Stanley that I have placed order for referral.  I will call her later.

## 2012-09-18 ENCOUNTER — Inpatient Hospital Stay: Payer: Self-pay | Admitting: Internal Medicine

## 2012-09-18 ENCOUNTER — Other Ambulatory Visit: Payer: Self-pay | Admitting: Internal Medicine

## 2012-09-18 ENCOUNTER — Telehealth: Payer: Self-pay | Admitting: Internal Medicine

## 2012-09-18 DIAGNOSIS — Z01810 Encounter for preprocedural cardiovascular examination: Secondary | ICD-10-CM

## 2012-09-18 HISTORY — PX: ORIF FEMUR FRACTURE: SHX2119

## 2012-09-18 LAB — CBC WITH DIFFERENTIAL/PLATELET
Basophil #: 0.1 10*3/uL (ref 0.0–0.1)
Basophil %: 0.7 %
HGB: 12.8 g/dL (ref 12.0–16.0)
Lymphocyte #: 4.4 10*3/uL — ABNORMAL HIGH (ref 1.0–3.6)
MCH: 29.5 pg (ref 26.0–34.0)
MCV: 89 fL (ref 80–100)
Monocyte #: 0.9 x10 3/mm (ref 0.2–0.9)
Monocyte %: 7.4 %
Neutrophil %: 48.7 %
Platelet: 325 10*3/uL (ref 150–440)
RBC: 4.33 10*6/uL (ref 3.80–5.20)
RDW: 15.1 % — ABNORMAL HIGH (ref 11.5–14.5)
WBC: 11.6 10*3/uL — ABNORMAL HIGH (ref 3.6–11.0)

## 2012-09-18 LAB — COMPREHENSIVE METABOLIC PANEL
Albumin: 3.4 g/dL (ref 3.4–5.0)
BUN: 16 mg/dL (ref 7–18)
Bilirubin,Total: 0.4 mg/dL (ref 0.2–1.0)
Calcium, Total: 10.2 mg/dL — ABNORMAL HIGH (ref 8.5–10.1)
Chloride: 102 mmol/L (ref 98–107)
Co2: 28 mmol/L (ref 21–32)
Creatinine: 0.9 mg/dL (ref 0.60–1.30)
EGFR (African American): >60
EGFR (Non-African Amer.): >60
Glucose: 170 mg/dL — ABNORMAL HIGH (ref 65–99)
Osmolality: 290 (ref 275–301)
SGOT(AST): 29 U/L (ref 15–37)
Sodium: 143 mmol/L (ref 136–145)
Total Protein: 6.5 g/dL (ref 6.4–8.2)

## 2012-09-18 LAB — URINALYSIS, COMPLETE
Blood: NEGATIVE
Glucose,UR: 50 mg/dL (ref 0–75)
Nitrite: NEGATIVE
Ph: 5 (ref 4.5–8.0)
Protein: 100
Specific Gravity: 1.021 (ref 1.003–1.030)
WBC UR: 6 /HPF (ref 0–5)

## 2012-09-18 LAB — POTASSIUM: Potassium: 3.8 mmol/L (ref 3.5–5.1)

## 2012-09-18 NOTE — Telephone Encounter (Signed)
rx sent in for triamcinolone cream.   

## 2012-09-18 NOTE — Telephone Encounter (Signed)
We can later do some formal testing.  Unclear as to an exact stage of dementia.  Keep [place] posted regarding her hip.

## 2012-09-18 NOTE — Telephone Encounter (Signed)
Okay to refill? Actually your pt-saw Dr. Dan Humphreys once for a rash

## 2012-09-18 NOTE — Telephone Encounter (Signed)
Misty Stanley called to let you know Cindy [name] fell early this morning @ [facility].  She is @ [facility] she broke her femur bone and is going to have surgery today @ 2:30.  Dr Patterson Hammersmith will be doing surgery   Misty Stanley stated the last  CT scan Cindy [name] had [facility] (aug 2014)  they were told she had hardening of the arterys.  She wanted to know what stage of demitia Cindy Bula is in.

## 2012-09-19 LAB — CBC WITH DIFFERENTIAL/PLATELET
Basophil %: 1.2 %
Eosinophil #: 0.4 10*3/uL (ref 0.0–0.7)
HCT: 33.6 % — ABNORMAL LOW (ref 35.0–47.0)
HGB: 11.1 g/dL — ABNORMAL LOW (ref 12.0–16.0)
Lymphocyte %: 17.5 %
MCH: 29.7 pg (ref 26.0–34.0)
MCHC: 33 g/dL (ref 32.0–36.0)
MCV: 90 fL (ref 80–100)
Monocyte #: 1.3 x10 3/mm — ABNORMAL HIGH (ref 0.2–0.9)
Neutrophil #: 7.9 10*3/uL — ABNORMAL HIGH (ref 1.4–6.5)
Platelet: 287 10*3/uL (ref 150–440)
WBC: 11.8 10*3/uL — ABNORMAL HIGH (ref 3.6–11.0)

## 2012-09-19 LAB — BASIC METABOLIC PANEL
Anion Gap: 6 — ABNORMAL LOW (ref 7–16)
BUN: 15 mg/dL (ref 7–18)
Calcium, Total: 8.4 mg/dL — ABNORMAL LOW (ref 8.5–10.1)
Chloride: 103 mmol/L (ref 98–107)
Co2: 30 mmol/L (ref 21–32)
Creatinine: 1.06 mg/dL (ref 0.60–1.30)
EGFR (African American): 59 — ABNORMAL LOW
EGFR (Non-African Amer.): 51 — ABNORMAL LOW
Glucose: 243 mg/dL — ABNORMAL HIGH (ref 65–99)
Osmolality: 286 (ref 275–301)
Sodium: 139 mmol/L (ref 136–145)

## 2012-09-19 NOTE — Telephone Encounter (Signed)
Lisa notified & will call me to let me know when she is released so that I can request records

## 2012-09-20 LAB — HEMOGLOBIN: HGB: 10.1 g/dL — ABNORMAL LOW (ref 12.0–16.0)

## 2012-09-21 LAB — URINALYSIS, COMPLETE
Nitrite: NEGATIVE
Protein: 30
RBC,UR: 1 /HPF (ref 0–5)
Squamous Epithelial: 6

## 2012-09-22 ENCOUNTER — Encounter: Payer: Self-pay | Admitting: Internal Medicine

## 2012-09-23 ENCOUNTER — Telehealth: Payer: Self-pay | Admitting: Internal Medicine

## 2012-09-23 NOTE — Telephone Encounter (Signed)
Pt's daughter called and wanted to let you know she is back at [facility] after breaking her hip.

## 2012-09-23 NOTE — Telephone Encounter (Signed)
Hospital records requested & received (in your folder)

## 2012-09-23 NOTE — Telephone Encounter (Signed)
noted 

## 2012-09-24 LAB — URINALYSIS, COMPLETE
Bacteria: NONE SEEN
Bilirubin,UR: NEGATIVE
Blood: NEGATIVE
Glucose,UR: NEGATIVE mg/dL (ref 0–75)
Hyaline Cast: 2
Ketone: NEGATIVE
Leukocyte Esterase: NEGATIVE
Nitrite: NEGATIVE
Ph: 7 (ref 4.5–8.0)
RBC,UR: <1 /HPF (ref 0–5)
Specific Gravity: 1.013 (ref 1.003–1.030)
WBC UR: 2 /HPF (ref 0–5)

## 2012-09-25 LAB — BASIC METABOLIC PANEL
Anion Gap: 5 — ABNORMAL LOW (ref 7–16)
BUN: 16 mg/dL (ref 7–18)
Calcium, Total: 8.5 mg/dL (ref 8.5–10.1)
EGFR (African American): >60
Sodium: 141 mmol/L (ref 136–145)

## 2012-09-25 LAB — CBC WITH DIFFERENTIAL/PLATELET
Eosinophil #: 0.5 10*3/uL (ref 0.0–0.7)
Eosinophil %: 7 %
HCT: 32.2 % — ABNORMAL LOW (ref 35.0–47.0)
HGB: 11.1 g/dL — ABNORMAL LOW (ref 12.0–16.0)
Lymphocyte %: 30.5 %
MCH: 30.6 pg (ref 26.0–34.0)
Monocyte %: 8.1 %
Neutrophil #: 3.9 10*3/uL (ref 1.4–6.5)
Neutrophil %: 53.5 %
WBC: 7.3 10*3/uL (ref 3.6–11.0)

## 2012-09-26 LAB — URINE CULTURE

## 2012-09-29 ENCOUNTER — Ambulatory Visit: Payer: Medicare Other | Admitting: Internal Medicine

## 2012-09-30 LAB — BASIC METABOLIC PANEL
BUN: 16 mg/dL (ref 7–18)
Calcium, Total: 8.8 mg/dL (ref 8.5–10.1)
Co2: 29 mmol/L (ref 21–32)
Creatinine: 1.05 mg/dL (ref 0.60–1.30)
EGFR (Non-African Amer.): 52 — ABNORMAL LOW
Osmolality: 277 (ref 275–301)
Potassium: 4.2 mmol/L (ref 3.5–5.1)
Sodium: 137 mmol/L (ref 136–145)

## 2012-10-02 ENCOUNTER — Encounter: Payer: Self-pay | Admitting: Internal Medicine

## 2012-10-02 ENCOUNTER — Other Ambulatory Visit: Payer: Self-pay | Admitting: Internal Medicine

## 2012-10-11 ENCOUNTER — Inpatient Hospital Stay: Payer: Self-pay | Admitting: Internal Medicine

## 2012-10-11 LAB — COMPREHENSIVE METABOLIC PANEL
Anion Gap: 5 — ABNORMAL LOW (ref 7–16)
Bilirubin,Total: 0.3 mg/dL (ref 0.2–1.0)
Calcium, Total: 8.8 mg/dL (ref 8.5–10.1)
Chloride: 108 mmol/L — ABNORMAL HIGH (ref 98–107)
Co2: 27 mmol/L (ref 21–32)
Creatinine: 1.02 mg/dL (ref 0.60–1.30)
EGFR (Non-African Amer.): 53 — ABNORMAL LOW
Osmolality: 287 (ref 275–301)
Potassium: 4.6 mmol/L (ref 3.5–5.1)
Total Protein: 6.5 g/dL (ref 6.4–8.2)

## 2012-10-11 LAB — CBC
HGB: 12.7 g/dL (ref 12.0–16.0)
MCH: 29.6 pg (ref 26.0–34.0)
MCHC: 32.9 g/dL (ref 32.0–36.0)
MCV: 90 fL (ref 80–100)
Platelet: 242 10*3/uL (ref 150–440)

## 2012-10-11 LAB — URINALYSIS, COMPLETE
Bacteria: NONE SEEN
Bilirubin,UR: NEGATIVE
Ketone: NEGATIVE
Leukocyte Esterase: NEGATIVE
Ph: 5 (ref 4.5–8.0)
RBC,UR: <1 /HPF (ref 0–5)
Squamous Epithelial: 1
WBC UR: 1 /HPF (ref 0–5)

## 2012-10-11 LAB — PROTIME-INR
INR: 0.9
Prothrombin Time: 12.8 secs (ref 11.5–14.7)

## 2012-10-11 LAB — APTT: Activated PTT: 26.9 secs (ref 23.6–35.9)

## 2012-10-12 DIAGNOSIS — Z01810 Encounter for preprocedural cardiovascular examination: Secondary | ICD-10-CM

## 2012-10-12 LAB — CBC WITH DIFFERENTIAL/PLATELET
Basophil #: 0.1 10*3/uL (ref 0.0–0.1)
Eosinophil #: 0.3 10*3/uL (ref 0.0–0.7)
Eosinophil %: 3.7 %
HGB: 11.9 g/dL — ABNORMAL LOW (ref 12.0–16.0)
Lymphocyte %: 28.9 %
MCV: 89 fL (ref 80–100)
Monocyte #: 0.7 x10 3/mm (ref 0.2–0.9)
Monocyte %: 8.4 %
Neutrophil %: 58.3 %
Platelet: 210 10*3/uL (ref 150–440)
WBC: 8.4 10*3/uL (ref 3.6–11.0)

## 2012-10-12 LAB — BASIC METABOLIC PANEL
Anion Gap: 4 — ABNORMAL LOW (ref 7–16)
Calcium, Total: 8.6 mg/dL (ref 8.5–10.1)
Co2: 29 mmol/L (ref 21–32)
EGFR (African American): >60
EGFR (Non-African Amer.): >60
Potassium: 4.1 mmol/L (ref 3.5–5.1)

## 2012-10-14 LAB — CBC WITH DIFFERENTIAL/PLATELET
Basophil #: 0 10*3/uL (ref 0.0–0.1)
Basophil %: 0.4 %
Eosinophil #: 0.3 10*3/uL (ref 0.0–0.7)
HCT: 35.4 % (ref 35.0–47.0)
HGB: 11.9 g/dL — ABNORMAL LOW (ref 12.0–16.0)
Lymphocyte #: 1.5 10*3/uL (ref 1.0–3.6)
MCH: 30 pg (ref 26.0–34.0)
MCHC: 33.4 g/dL (ref 32.0–36.0)
Monocyte #: 0.7 x10 3/mm (ref 0.2–0.9)
Monocyte %: 7.1 %
Neutrophil #: 7.7 10*3/uL — ABNORMAL HIGH (ref 1.4–6.5)
Neutrophil %: 74.4 %
RBC: 3.95 10*6/uL (ref 3.80–5.20)
WBC: 10.4 10*3/uL (ref 3.6–11.0)

## 2012-10-14 LAB — BASIC METABOLIC PANEL
BUN: 12 mg/dL (ref 7–18)
Chloride: 108 mmol/L — ABNORMAL HIGH (ref 98–107)
Osmolality: 283 (ref 275–301)

## 2012-10-14 LAB — MAGNESIUM: Magnesium: 1 mg/dL — ABNORMAL LOW

## 2012-10-15 LAB — MAGNESIUM: Magnesium: 1.7 mg/dL — ABNORMAL LOW

## 2012-10-17 ENCOUNTER — Encounter: Payer: Self-pay | Admitting: Internal Medicine

## 2012-10-22 ENCOUNTER — Encounter: Payer: Self-pay | Admitting: Internal Medicine

## 2012-10-30 LAB — URINALYSIS, COMPLETE
Blood: NEGATIVE
Glucose,UR: >=500 mg/dL (ref 0–75)
Ph: 5 (ref 4.5–8.0)
Protein: 30
RBC,UR: 10 /HPF (ref 0–5)
Specific Gravity: 1.031 (ref 1.003–1.030)
Squamous Epithelial: 4
WBC UR: 3 /HPF (ref 0–5)

## 2012-10-30 LAB — TSH: Thyroid Stimulating Horm: 2.23 u[IU]/mL

## 2012-11-22 ENCOUNTER — Encounter: Payer: Self-pay | Admitting: Internal Medicine

## 2012-12-01 LAB — URINALYSIS, COMPLETE
Bilirubin,UR: NEGATIVE
Glucose,UR: >=500 mg/dL (ref 0–75)
Ketone: NEGATIVE
Ph: 5 (ref 4.5–8.0)
Protein: NEGATIVE
RBC,UR: 4 /HPF (ref 0–5)
Specific Gravity: 1.024 (ref 1.003–1.030)
Squamous Epithelial: 1
WBC UR: 50 /HPF (ref 0–5)

## 2012-12-03 LAB — URINE CULTURE

## 2012-12-22 ENCOUNTER — Encounter: Payer: Self-pay | Admitting: Internal Medicine

## 2013-01-22 ENCOUNTER — Encounter: Payer: Self-pay | Admitting: Internal Medicine

## 2013-02-22 ENCOUNTER — Encounter: Payer: Self-pay | Admitting: Internal Medicine

## 2013-03-12 LAB — URINALYSIS, COMPLETE
BACTERIA: NONE SEEN
BILIRUBIN, UR: NEGATIVE
Blood: NEGATIVE
Glucose,UR: >=500 mg/dL (ref 0–75)
Hyaline Cast: 168
KETONE: NEGATIVE
Nitrite: NEGATIVE
PROTEIN: NEGATIVE
Ph: 5 (ref 4.5–8.0)
Specific Gravity: 1.027 (ref 1.003–1.030)
Squamous Epithelial: 3
WBC UR: 10 /HPF (ref 0–5)

## 2013-03-17 LAB — URINE CULTURE

## 2013-03-22 ENCOUNTER — Encounter: Payer: Self-pay | Admitting: Internal Medicine

## 2013-04-06 ENCOUNTER — Ambulatory Visit: Payer: Self-pay | Admitting: Surgery

## 2013-04-06 LAB — CBC WITH DIFFERENTIAL/PLATELET
BASOS ABS: 0.1 10*3/uL (ref 0.0–0.1)
BASOS PCT: 0.8 %
Eosinophil #: 0.2 10*3/uL (ref 0.0–0.7)
Eosinophil %: 2.3 %
HCT: 42.5 % (ref 35.0–47.0)
HGB: 13.9 g/dL (ref 12.0–16.0)
Lymphocyte #: 3.2 10*3/uL (ref 1.0–3.6)
Lymphocyte %: 31 %
MCH: 29.6 pg (ref 26.0–34.0)
MCHC: 32.8 g/dL (ref 32.0–36.0)
MCV: 90 fL (ref 80–100)
Monocyte #: 0.6 x10 3/mm (ref 0.2–0.9)
Monocyte %: 5.8 %
Neutrophil #: 6.2 10*3/uL (ref 1.4–6.5)
Neutrophil %: 60.1 %
PLATELETS: 263 10*3/uL (ref 150–440)
RBC: 4.69 10*6/uL (ref 3.80–5.20)
RDW: 13.5 % (ref 11.5–14.5)
WBC: 10.3 10*3/uL (ref 3.6–11.0)

## 2013-04-06 LAB — BASIC METABOLIC PANEL
Anion Gap: 4 — ABNORMAL LOW (ref 7–16)
BUN: 21 mg/dL — ABNORMAL HIGH (ref 7–18)
CO2: 28 mmol/L (ref 21–32)
Calcium, Total: 9 mg/dL (ref 8.5–10.1)
Chloride: 105 mmol/L (ref 98–107)
Creatinine: 0.87 mg/dL (ref 0.60–1.30)
EGFR (Non-African Amer.): >60
Glucose: 188 mg/dL — ABNORMAL HIGH (ref 65–99)
OSMOLALITY: 282 (ref 275–301)
Potassium: 4.1 mmol/L (ref 3.5–5.1)
Sodium: 137 mmol/L (ref 136–145)

## 2013-04-06 LAB — PROTIME-INR
INR: 0.9
PROTHROMBIN TIME: 11.7 s (ref 11.5–14.7)

## 2013-04-14 ENCOUNTER — Ambulatory Visit: Payer: Self-pay | Admitting: Surgery

## 2013-04-14 LAB — CREATININE, SERUM
Creatinine: 0.84 mg/dL (ref 0.60–1.30)
EGFR (African American): >60

## 2013-04-15 LAB — COMPREHENSIVE METABOLIC PANEL
Albumin: 3.1 g/dL — ABNORMAL LOW (ref 3.4–5.0)
Alkaline Phosphatase: 60 U/L
Anion Gap: 8 (ref 7–16)
BUN: 18 mg/dL (ref 7–18)
Bilirubin,Total: 0.6 mg/dL (ref 0.2–1.0)
CREATININE: 0.87 mg/dL (ref 0.60–1.30)
Calcium, Total: 8.3 mg/dL — ABNORMAL LOW (ref 8.5–10.1)
Chloride: 119 mmol/L — ABNORMAL HIGH (ref 98–107)
Co2: 25 mmol/L (ref 21–32)
EGFR (African American): >60
EGFR (Non-African Amer.): >60
Glucose: 182 mg/dL — ABNORMAL HIGH (ref 65–99)
OSMOLALITY: 308 (ref 275–301)
POTASSIUM: 4.6 mmol/L (ref 3.5–5.1)
SGOT(AST): 16 U/L (ref 15–37)
SGPT (ALT): 16 U/L (ref 12–78)
SODIUM: 152 mmol/L — AB (ref 136–145)
Total Protein: 6.4 g/dL (ref 6.4–8.2)

## 2013-04-15 LAB — CBC WITH DIFFERENTIAL/PLATELET
BASOS ABS: 0.1 10*3/uL (ref 0.0–0.1)
Basophil %: 0.6 %
EOS PCT: 0.3 %
Eosinophil #: 0 10*3/uL (ref 0.0–0.7)
HCT: 39.5 % (ref 35.0–47.0)
HGB: 13.4 g/dL (ref 12.0–16.0)
LYMPHS ABS: 2.2 10*3/uL (ref 1.0–3.6)
Lymphocyte %: 17.1 %
MCH: 30.9 pg (ref 26.0–34.0)
MCHC: 33.9 g/dL (ref 32.0–36.0)
MCV: 91 fL (ref 80–100)
MONO ABS: 1.3 x10 3/mm — AB (ref 0.2–0.9)
MONOS PCT: 10 %
Neutrophil #: 9.3 10*3/uL — ABNORMAL HIGH (ref 1.4–6.5)
Neutrophil %: 72 %
Platelet: 234 10*3/uL (ref 150–440)
RBC: 4.34 10*6/uL (ref 3.80–5.20)
RDW: 13.6 % (ref 11.5–14.5)
WBC: 12.9 10*3/uL — AB (ref 3.6–11.0)

## 2013-04-15 LAB — SODIUM: Sodium: 136 mmol/L (ref 136–145)

## 2013-04-16 LAB — CBC WITH DIFFERENTIAL/PLATELET
BASOS ABS: 0.1 10*3/uL (ref 0.0–0.1)
Basophil %: 0.4 %
Eosinophil #: 0.1 10*3/uL (ref 0.0–0.7)
Eosinophil %: 1 %
HCT: 39.8 % (ref 35.0–47.0)
HGB: 13.4 g/dL (ref 12.0–16.0)
LYMPHS ABS: 3.1 10*3/uL (ref 1.0–3.6)
Lymphocyte %: 24.9 %
MCH: 30.6 pg (ref 26.0–34.0)
MCHC: 33.7 g/dL (ref 32.0–36.0)
MCV: 91 fL (ref 80–100)
MONO ABS: 1.2 x10 3/mm — AB (ref 0.2–0.9)
MONOS PCT: 9.8 %
NEUTROS ABS: 8 10*3/uL — AB (ref 1.4–6.5)
Neutrophil %: 63.9 %
PLATELETS: 217 10*3/uL (ref 150–440)
RBC: 4.39 10*6/uL (ref 3.80–5.20)
RDW: 14 % (ref 11.5–14.5)
WBC: 12.6 10*3/uL — ABNORMAL HIGH (ref 3.6–11.0)

## 2013-04-16 LAB — BASIC METABOLIC PANEL
ANION GAP: 6 — AB (ref 7–16)
BUN: 12 mg/dL (ref 7–18)
CO2: 26 mmol/L (ref 21–32)
Calcium, Total: 8.8 mg/dL (ref 8.5–10.1)
Chloride: 104 mmol/L (ref 98–107)
Creatinine: 0.88 mg/dL (ref 0.60–1.30)
EGFR (African American): >60
EGFR (Non-African Amer.): >60
Glucose: 178 mg/dL — ABNORMAL HIGH (ref 65–99)
OSMOLALITY: 276 (ref 275–301)
Potassium: 3.8 mmol/L (ref 3.5–5.1)
Sodium: 136 mmol/L (ref 136–145)

## 2013-04-22 ENCOUNTER — Encounter: Payer: Self-pay | Admitting: Internal Medicine

## 2013-05-03 ENCOUNTER — Encounter: Payer: Self-pay | Admitting: Internal Medicine

## 2013-05-14 ENCOUNTER — Telehealth: Payer: Self-pay | Admitting: Internal Medicine

## 2013-05-14 NOTE — Telephone Encounter (Signed)
Called to check status of paperwork for lawyer.  States she wants to come on lunch break to pick up.  Advised not available.   States it was faxed on 4/13 but she is coming for the original copy.

## 2013-05-14 NOTE — Telephone Encounter (Signed)
Pt picked up copy of letter already

## 2013-05-22 ENCOUNTER — Encounter: Payer: Self-pay | Admitting: Internal Medicine

## 2013-06-22 ENCOUNTER — Encounter: Payer: Self-pay | Admitting: Internal Medicine

## 2013-07-22 ENCOUNTER — Encounter: Payer: Self-pay | Admitting: Internal Medicine

## 2013-08-04 LAB — HEMOGLOBIN A1C: HEMOGLOBIN A1C: 7.6 % — AB (ref 4.2–6.3)

## 2013-08-22 ENCOUNTER — Encounter: Payer: Self-pay | Admitting: Internal Medicine

## 2013-09-22 ENCOUNTER — Encounter: Payer: Self-pay | Admitting: Internal Medicine

## 2013-10-20 LAB — CBC WITH DIFFERENTIAL/PLATELET
Basophil #: 0.1 10*3/uL (ref 0.0–0.1)
Basophil %: 0.9 %
EOS PCT: 3.3 %
Eosinophil #: 0.3 10*3/uL (ref 0.0–0.7)
HCT: 41.9 % (ref 35.0–47.0)
HGB: 13.8 g/dL (ref 12.0–16.0)
LYMPHS PCT: 36.9 %
Lymphocyte #: 3.3 10*3/uL (ref 1.0–3.6)
MCH: 30.1 pg (ref 26.0–34.0)
MCHC: 32.8 g/dL (ref 32.0–36.0)
MCV: 92 fL (ref 80–100)
Monocyte #: 0.7 x10 3/mm (ref 0.2–0.9)
Monocyte %: 7.2 %
NEUTROS ABS: 4.7 10*3/uL (ref 1.4–6.5)
Neutrophil %: 51.7 %
PLATELETS: 419 10*3/uL (ref 150–440)
RBC: 4.56 10*6/uL (ref 3.80–5.20)
RDW: 12.6 % (ref 11.5–14.5)
WBC: 9 10*3/uL (ref 3.6–11.0)

## 2013-10-22 ENCOUNTER — Encounter: Payer: Self-pay | Admitting: Internal Medicine

## 2013-11-11 ENCOUNTER — Ambulatory Visit: Payer: Self-pay | Admitting: Gerontology

## 2013-11-17 LAB — CBC WITH DIFFERENTIAL/PLATELET
BASOS ABS: 0.1 10*3/uL (ref 0.0–0.1)
Basophil %: 1 %
Eosinophil #: 0.2 10*3/uL (ref 0.0–0.7)
Eosinophil %: 3.1 %
HCT: 41.9 % (ref 35.0–47.0)
HGB: 13.8 g/dL (ref 12.0–16.0)
LYMPHS PCT: 35 %
Lymphocyte #: 2.8 10*3/uL (ref 1.0–3.6)
MCH: 30.7 pg (ref 26.0–34.0)
MCHC: 33 g/dL (ref 32.0–36.0)
MCV: 93 fL (ref 80–100)
Monocyte #: 0.6 x10 3/mm (ref 0.2–0.9)
Monocyte %: 7.3 %
Neutrophil #: 4.3 10*3/uL (ref 1.4–6.5)
Neutrophil %: 53.6 %
Platelet: 261 10*3/uL (ref 150–440)
RBC: 4.51 10*6/uL (ref 3.80–5.20)
RDW: 13.8 % (ref 11.5–14.5)
WBC: 8.1 10*3/uL (ref 3.6–11.0)

## 2013-11-17 LAB — BASIC METABOLIC PANEL
Anion Gap: 9 (ref 7–16)
BUN: 28 mg/dL — AB (ref 7–18)
CALCIUM: 8.8 mg/dL (ref 8.5–10.1)
CHLORIDE: 105 mmol/L (ref 98–107)
CO2: 29 mmol/L (ref 21–32)
Creatinine: 1 mg/dL (ref 0.60–1.30)
EGFR (African American): >60
EGFR (Non-African Amer.): 57 — ABNORMAL LOW
Glucose: 169 mg/dL — ABNORMAL HIGH (ref 65–99)
Osmolality: 294 (ref 275–301)
Potassium: 4.1 mmol/L (ref 3.5–5.1)
Sodium: 143 mmol/L (ref 136–145)

## 2013-11-17 LAB — LIPID PANEL
CHOLESTEROL: 158 mg/dL (ref 0–200)
HDL: 37 mg/dL — AB (ref 40–60)
LDL CHOLESTEROL, CALC: 71 mg/dL (ref 0–100)
TRIGLYCERIDES: 248 mg/dL — AB (ref 0–200)
VLDL Cholesterol, Calc: 50 mg/dL — ABNORMAL HIGH (ref 5–40)

## 2013-11-17 LAB — TSH: THYROID STIMULATING HORM: 2.53 u[IU]/mL

## 2013-11-17 LAB — MAGNESIUM: Magnesium: 2 mg/dL

## 2013-11-22 ENCOUNTER — Encounter: Payer: Self-pay | Admitting: Internal Medicine

## 2013-12-22 ENCOUNTER — Encounter: Payer: Self-pay | Admitting: Internal Medicine

## 2014-01-22 ENCOUNTER — Encounter: Payer: Self-pay | Admitting: Internal Medicine

## 2014-01-26 DIAGNOSIS — D649 Anemia, unspecified: Secondary | ICD-10-CM | POA: Diagnosis not present

## 2014-01-26 DIAGNOSIS — E119 Type 2 diabetes mellitus without complications: Secondary | ICD-10-CM | POA: Diagnosis not present

## 2014-01-26 LAB — HEMOGLOBIN A1C: HEMOGLOBIN A1C: 7.4 % — AB (ref 4.2–6.3)

## 2014-02-16 DIAGNOSIS — G309 Alzheimer's disease, unspecified: Secondary | ICD-10-CM | POA: Diagnosis not present

## 2014-02-16 DIAGNOSIS — E119 Type 2 diabetes mellitus without complications: Secondary | ICD-10-CM | POA: Diagnosis not present

## 2014-02-16 DIAGNOSIS — J449 Chronic obstructive pulmonary disease, unspecified: Secondary | ICD-10-CM | POA: Diagnosis not present

## 2014-02-22 ENCOUNTER — Encounter: Payer: Self-pay | Admitting: Internal Medicine

## 2014-03-23 ENCOUNTER — Encounter
Admit: 2014-03-23 | Disposition: A | Discharge status: Home / Self Care | Payer: Self-pay | Attending: Internal Medicine | Admitting: Internal Medicine

## 2014-04-23 ENCOUNTER — Encounter
Admit: 2014-04-23 | Disposition: A | Discharge status: Home / Self Care | Payer: Self-pay | Attending: Internal Medicine | Admitting: Internal Medicine

## 2014-04-23 DIAGNOSIS — I4891 Unspecified atrial fibrillation: Secondary | ICD-10-CM | POA: Diagnosis not present

## 2014-04-23 DIAGNOSIS — E1122 Type 2 diabetes mellitus with diabetic chronic kidney disease: Secondary | ICD-10-CM | POA: Diagnosis not present

## 2014-04-23 DIAGNOSIS — N183 Chronic kidney disease, stage 3 (moderate): Secondary | ICD-10-CM | POA: Diagnosis not present

## 2014-04-23 DIAGNOSIS — J449 Chronic obstructive pulmonary disease, unspecified: Secondary | ICD-10-CM | POA: Diagnosis not present

## 2014-05-14 NOTE — Consult Note (Signed)
Brief Consult Note: Diagnosis: right femur fracture.   Patient was seen by consultant.   Consult note dictated.   Recommend to proceed with surgery or procedure.   Orders entered.   Discussed with Attending MD.   Comments: Plan ORIF with cerclage wires and interlocking screws.  Electronic Signatures: Laurene Footman (MD)  (Signed 20-Sep-14 19:24)  Authored: Brief Consult Note   Last Updated: 20-Sep-14 19:24 by Laurene Footman (MD)

## 2014-05-14 NOTE — H&P (Signed)
PATIENT NAME:  Cindy, [name] MR#:  [id number] DATE OF BIRTH:  1933/08/26  DATE OF ADMISSION:  10/11/2012  PRIMARY CARE PHYSICIAN:  Dr. Einar Pheasant  ORTHOPEDIC PHYSICIAN:  Dr. Hessie Knows  CHIEF COMPLAINT: Right hip pain after fall today.   HISTORY OF PRESENT ILLNESS:  Cindy [name] is a pleasant, 82 year old Caucasian female who has past medical history of COPD on home oxygen, type 2 diabetes, hyperlipidemia, hypertension. Comes to the Emergency Room after she had a fall at [facility] while she was trying to go to the bathroom without using a walker. The patient had a fall, started complaining of right hip pain, came to the emergency room, found to have a fracture of the right femur shaft below her previous surgery. She recently had ORIF of the right intertrochanteric hip on 08/26/2012, and another surgery secondary to fall ORIF right femur August 28. The patient is being admitted on the orthopedic floor. Dr. Rudene Christians saw the patient and plans on taking her to the OR tomorrow. The patient denies any chest pain or shortness of breath. She denies any dizziness or loss of consciousness.   PAST MEDICAL HISTORY: 1. Recent right hip fracture x 2 in August. She underwent ORIF of the right intertrochanteric hip fracture on 08/26/2012, and ORIF of right femur on August 28.  2.  COPD, on 2 liters nasal cannula oxygen.  3.  Cancer, squamous cell, left upper lobe lingulectomy in 2006.  4.  Cholecystectomy.  5. History of bladder cancer, patient on chemotherapy in 2003.  6.  Hypercholesterolemia.  7.  Hypertension.  8.  GERD.  9.  History of AFib.  10.  History of bronchitis.  11.  Allergic rhinitis.  12.  Hypothyroidism.   PAST SURGICAL HISTORY: 1.  Cholecystectomy.  2.  Lung surgery due to cancer.  3.  Right hip replacement on 08/26/2012, and repeat surgery on the right femur ORIF August 28.   FAMILY HISTORY:  Father died at age 110 of CVA and Alzheimer's. Mother had diabetes.   SOCIAL HISTORY: She  lives at home with her daughters. However, she is currently at [facility] getting rehab. She used to have a history of smoking, currently does not smoke. Denies alcohol use.  ALLERGIES:  Listed as NOVOLOG.   HOME MEDICATIONS: 1.  Lovastatin 20 mg p.o. daily.  2.  Spiriva 18 mcg inhalation daily.  3.  Metoprolol 50 mg daily. 4.  Amaryl 2 mg p.o. b.i.d.  5.  Losartan 100 mg p.o. daily.  6.  Nystatin topically every 12 hours as needed.  7.  Advair 250/50, 1 puff b.i.d.  8.  Albuterol every 6 hours as needed.  9.  Percocet 5/325 every 4 to 6 hours as needed.  10.  Senokot with Colace as needed.  11.  Ferrous sulfate 325 mg p.o. b.i.d.  12.  Oxycodone 5 mg every 4 to 6 hours as needed for pain.  13.  Lovenox.  14.  Oxygen 2 liters by nasal cannula.  15.  MiraLAX for constipation.   REVIEW OF SYSTEMS:    CONSTITUTIONAL: No fever. Positive for weakness and right hip pain.  EYES:  No blurred or double vision, glaucoma or cataracts.  ENT:  No tinnitus, ear pain, hearing loss or postnasal drip.  RESPIRATORY: No cough, wheeze, hemoptysis, dyspnea or COPD.  CARDIOVASCULAR: No chest pain, orthopnea, edema. Positive for hypertension.  GASTROINTESTINAL: No nausea, vomiting, diarrhea, abdominal pain or hematemesis.  GENITOURINARY: No dysuria, hematuria or frequency.  ENDOCRINE: No polyuria, nocturia  or thyroid problems.  HEMATOLOGY: No anemia or easy bruising.  SKIN: No acne, lesions. The patient does have some skin rash over her face.  MUSCULOSKELETAL: Positive for right hip pain, arthritis, and limited activity of the right hip.  NEUROLOGIC:  No CVA, TIA, dysarthria or dementia.   PSYCHIATRIC: No anxiety, depression or bipolar disorder.  All other systems reviewed are negative.   LABS:  Glucose 182, BUN is 22, creatinine 1.02, sodium is 140, potassium is 4.6, chloride is 108, bicarb is 27, total protein is 6.5. CBC within normal limits. UA negative for UTI. Right hip x-ray shows right hip  placement of a femoral intramedullary nail transfixing the comminuted proximal diaphysial fracture and 2 proximal femoral neck screws present transfixing femoral neck fracture. There are 2 cerclage wires intact. The fractures are in near-anatomic alignment.   ASSESSMENT AND PLAN:  Cindy [name] is a 81 year old with history of chronic obstructive pulmonary disease on home oxygen, history of type 2 diabetes, hypertension, comes in with:  1.  Right femur shaft fracture. The patient was seen by Dr. Rudene Christians, who will schedule patient for OR tomorrow. The patient is at intermediate risk for surgery. She already has had 2 hip surgeries on the same side in August 2014. She denies any chest pain. She has no EKG changes. Pain medication and deep vein thrombosis medications per Ortho team. This was discussed with Dr. Rudene Christians.  2.  Type 2 diabetes. Will hold oral hypoglycemic medications at this time. Will continue Accu-Chek. Will place patient on sliding scale.  3.  Chronic obstructive pulmonary disease. Appears to be stable. The patient is not in any exacerbation at this point. Will continue Advair, Spiriva nebulizer as needed, and oxygen.  4.  Hypertension. Will resume her home medications.  5. Deep vein thrombosis prophylaxis per Orthopedics team.   The patient's code status is FULL CODE. This was discussed with patient's daughter, who was present in the Emergency Room.   Above was also discussed with Dr. Rudene Christians.  Time spent:  50 minutes.    ____________________________ Hart Rochester Posey Pronto, MD sap:mr D: 10/11/2012 19:37:34 ET T: 10/11/2012 20:12:19 ET JOB#: [id number]  cc: Emerson Barretto A. Posey Pronto, MD, <Dictator> Einar Pheasant, MD Laurene Footman, MD   Ilda Basset MD ELECTRONICALLY SIGNED 10/13/2012 13:13

## 2014-05-14 NOTE — Discharge Summary (Signed)
PATIENT NAME:  Cindy [name], Cindy [name] MR#:  [id number] DATE OF BIRTH:  May 30, 1933  DATE OF ADMISSION:  08/25/2012 DATE OF DISCHARGE:  08/29/2012  PRIMARY CARE PHYSICIAN: Dr. Nicki Reaper  CONSULTING PHYSICIAN:  Hessie Knows, MD  DISCHARGE DIAGNOSIS:  1.  Right hip fracture after mechanical fall.  2.  Diabetes type 2. 3.  Chronic obstructive pulmonary disease.  4.  Hypertension.   CONDITION: Stable.   CODE STATUS: FULL CODE.   HOME MEDICATIONS: Please refer to the [facility] discharge instruction medication reconciliation list.   ACTIVITY: As tolerated.   FOLLOW-UP CARE: Follow up with PCP within 1 to 2 weeks. Follow up with Dr. Rudene Christians within 1 week.   REASON FOR ADMISSION: Right hip pain status post a fall.   HOSPITAL COURSE: A 81 year old Caucasian female with a history of frequent falls due to generalized deconditioning, hypertension, diabetes and COPD who was sent to ED due to fall and right hip pain, and the patient was noticed to have right hip fracture and admitted to medicine. For detailed history and physical and examination, please refer to the admission note dictated by Dr. Lenore Manner.  Laboratory data on admission date are as following:  CAT scan of head:  No intracranial abnormality.  CT of cervical spine did not show any fracture.  Chest x-ray:  No acute cardiopulmonary abnormality. X-ray of right hip showed right hip fracture. EKG was unremarkable. BUN 15, creatinine 0.8, glucose 322, potassium 3.9. CBC 15,000, hemoglobin 13. Urinalysis was unremarkable.  1.  Right hip fracture. The patient got ORIF surgery after admission.  After surgery the patient has been treated with DVT prophylaxis and spirometry.  2.  For diabetes, the patient has been treated with glimepiride and sliding scale. 3.  Hypertension has been controlled with Toprol.  4.  COPD has been stable, treated with nebulizer p.r.n. and oxygen and Advair.  5.  The patient has leukocytosis which is due to reaction. Now WBC decreased to  9.7. 6.  Anemia. Possibly due to blood loss from hip fracture and surgery.  The patient's hemoglobin has been stable. The last hemoglobin is 10.1 today.   The patient is clinically stable and will be discharged to a skilled nursing facility for physical therapy and rehab.  I discussed the patient's discharge plan with the patient, case manager and nurse.   TIME SPENT: About 38 minutes. ____________________________ Demetrios Loll, MD qc:sb D: 08/29/2012 10:51:18 ET T: 08/29/2012 11:34:30 ET JOB#: [id number]  cc: Demetrios Loll, MD, <Dictator> Demetrios Loll MD ELECTRONICALLY SIGNED 09/01/2012 22:25

## 2014-05-14 NOTE — Consult Note (Signed)
Brief Consult Note: Diagnosis: right intertrochanteric hip fracture.   Patient was seen by consultant.   Recommend to proceed with surgery or procedure.   Comments: plan ORIF later today if medically ok.  Electronic Signatures: Laurene Footman (MD)  (Signed 05-Aug-14 07:15)  Authored: Brief Consult Note   Last Updated: 05-Aug-14 07:15 by Laurene Footman (MD)

## 2014-05-14 NOTE — Consult Note (Signed)
PATIENT NAME:  Cindy [name], Cindy [name] MR#:  [id number] DATE OF BIRTH:  1933/09/14  DATE OF CONSULTATION:  09/18/2012  REFERRING PHYSICIAN:   CONSULTING PHYSICIAN:  Laurene Footman, MD  CHIEF COMPLAINT: Right hip pain.   HISTORY OF PRESENT ILLNESS: The patient is a 81 year old, who underwent ORIF of right proximal femur approximately 3 weeks ago. She was in skilled nursing facility and had another fall. She had pain and was brought to the Emergency Room where she was found to have a proximal femur fracture. She has significant health problems. Prior to her fracture, earlier in August, she had been at home. On exam, her right leg is slightly externally rotated. She is able to flex and extend the toes. She does not have significant edema of the right lower extremity. She has sensation intact.   X-rays reveal a spiral proximal femur around the intramedullary rod.   CLINICAL IMPRESSION: Proximal femur fracture.   RECOMMENDATIONS: ORIF with cerclage wires. Plan on doing this later today if possible.   ____________________________ Laurene Footman, MD mjm:aw D: 09/18/2012 07:30:11 ET T: 09/18/2012 07:39:00 ET JOB#: [id number]  cc: Laurene Footman, MD, <Dictator> Laurene Footman MD ELECTRONICALLY SIGNED 09/18/2012 9:11

## 2014-05-14 NOTE — Consult Note (Signed)
PATIENT NAME:  Cindy [name], Cindy [name] MR#:  [id number] DATE OF BIRTH:  1933-12-05  DATE OF CONSULTATION:  10/11/2012  CONSULTING PHYSICIAN:  Laurene Footman, MD  CHIEF COMPLAINT: Right thigh pain.   HISTORY OF PRESENT ILLNESS: The patient is a 81 year old who suffered an intertrochanteric fracture back in early August of this year. She had long Affixus rod placed several weeks later, end of August she suffered a femoral shaft fracture around the rod which was fixed with cerclage wires. She got up today without her walker to go the bathroom, fell again and has had a new fracture distal to the prior fractures around the distal shaft, another spiral long fracture. She has the leg externally rotated and is unable to bear weight. Prior x-rays and today's x-rays are reviewed and they confirm spiral femoral shaft fracture around a prior long Affixus. There is good callus at her prior intertrochanteric fracture, and the fracture from 3 weeks ago shows some evidence of healing. She is neurovascularly intact. Skin is intact.   IMPRESSION: Right new femoral shaft fracture around a rod.   PLAN: ORIF with cerclage wires and distal interlocking screws. Risk and benefits as well as complications were discussed. She should be weight-bearing as tolerated postoperatively with a walker.    ____________________________ Laurene Footman, MD mjm:jm D: 10/11/2012 19:30:52 ET T: 10/11/2012 21:29:23 ET JOB#: [id number]  cc: Laurene Footman, MD, <Dictator> Laurene Footman MD ELECTRONICALLY SIGNED 10/12/2012 9:19

## 2014-05-14 NOTE — Discharge Summary (Signed)
PATIENT NAME:  Cindy [name], Cindy [name] MR#:  [id number] DATE OF BIRTH:  1933/04/13  DATE OF ADMISSION:  09/18/2012 DATE OF DISCHARGE:  09/22/2012  DISCHARGE DIAGNOSES:   1.  Right femoral shaft fracture, status post surgery. 2.  History of recent right hip fracture.  3.  Hypertension.  4.  Chronic obstructive pulmonary disease.  5.  Diabetes mellitus type 2.  6.  Constipation.  7.  Hyperlipidemia.  8.  Pruritic skin rash, likely secondary to NovoLog insulin.   DISCHARGE MEDICATIONS:  1.  Lovastatin 20 mg p.o. daily.  2.  Spiriva 18 mcg inhalation daily.  3.  Metoprolol 50 mg p.o. daily.  4.  Amaryl 2 mg p.o. b.i.d.  5.  Losartan 100 mg p.o. daily.  6.  Nystatin topically every 12 hours.  7.  Advair Diskus 250/50, 1 puff b.i.d.  8.  Albuterol every 6 hours as needed.  9.  Percocet 5/325 every 4 to 6 hours as needed, 1 tablet.  10.  Senna with Colace as needed.  11.  Ferrous sulfate 325 mg p.o. b.i.d. with meals.  12.  Oxycodone 5 mg every  4 to 6 hrs hours as needed for pain. 13.  Lovenox 30 mg subcutaneous b.i.d. as per orthopedic recommendation.  14.  The patient's insulin is stopped. Do not give any NovoLog insulin.  15.  Prednisone 20 mg 2 tablets daily for 2 days, then 1 tablet daily for 2 days and then stop.  16.  MiraLAX as needed for constipation.  17.  Oxygen 2 liters via nasal cannula.   DIET: Low-sodium, low-fat, ADA diet.   FOLLOWUP: The patient needs to follow up with Dr. Rudene Christians in 2 weeks.   HOSPITAL COURSE:  52.  A 81 year old female patient with history of recent right hip fracture. Was at rehab. The patient was at [facility]. The patient had a recent right hip replacement by Dr. Rudene Christians. At the rehab place, the patient had a mechanic fall when she went to bathroom by herself. The patient had initial right hip surgery 3 weeks ago. The patient's x-ray showed a proximal femur fracture around the intramedullary rod. The patient was seen by Dr. Rudene Christians. The patient was taken to OR with  ORIF with cerclage wires. The patient tolerated the procedure well and seen by physical therapy. The patient's pain is controlled well with Percocet and oxycodone. The plan is to send her back to rehab and continue physical therapy and weight-bearing as tolerated. The patient will follow up with Dr. Rudene Christians as scheduled.  2.  Rash secondary to NovoLog. The patient has diffuse body rash. Seen by dermatologist. Plan to allergy followup next month, but the family is concerned about NovoLog which was recently started for her diabetes. They think the rash is coming from that and they wanted me to stop the NovoLog. I stopped the NovoLog and because of severe rash started on short course of prednisone. The patient is also on Atarax. Today, the rash is better. She does not have any more itching. She can continue prednisone for 2 more days and continue Atarax as needed.  3.  Acute blood loss anemia secondary to surgery: The patient's hemoglobin on admission was 12.8, hematocrit 38.7. The patient did not require any transfusion. Repeat hemoglobin was around 11.1 on August 29 and the patient is stable.  4.  Diabetes mellitus type 2: The patient's blood sugars are a little bit up today to 252 secondary to steroids. She can continue Amaryl 2 mg b.i.d.  5.  Hypertension: Blood pressure is stable now at 120/70, heart rate 67. She is on metoprolol. Continue that.  6.  Constipation: Use stool softeners as needed.  7.  COPD: No wheezing now. Continue Advair Diskus and also Spiriva.   TIME SPENT ON DISCHARGE PREPARATION: More than 30 minutes. Discussed the plan with the patient's daughter. She will be going to [facility] today.   ____________________________ Epifanio Lesches, MD sk:jm D: 09/22/2012 10:26:14 ET T: 09/22/2012 13:52:43 ET JOB#: [id number]  cc: Epifanio Lesches, MD, <Dictator> Epifanio Lesches MD ELECTRONICALLY SIGNED 10/03/2012 22:49

## 2014-05-14 NOTE — Op Note (Signed)
PATIENT NAME:  SCHUYLER, BEHAN MR#:  [id number] DATE OF BIRTH:  [birth year]  DATE OF PROCEDURE:  08/26/2012  PREOPERATIVE DIAGNOSIS: Right intertrochanteric hip fracture.  POSTOPERATIVE DIAGNOSIS: Right intertrochanteric hip fracture.  PROCEDURE: Open reduction internal fixation, right intertrochanteric hip fracture.   ANESTHESIA: Spinal.  SURGEON: Laurene Footman, MD.   DESCRIPTION OF PROCEDURE: The patient was brought to the operating room and after adequate anesthesia was obtained, the patient was placed on the fracture table with the left leg in the well-leg holder. Reduction maneuver carried out with the right hip with flexion, traction and then bringing the leg out into extension.   After placing the foot in the traction boot, slight traction was applied and C-arm was brought in. Very good reduction was obtained through this means.   After initially getting acceptable imaging on AP and lateral x-rays, the hip was prepped and draped using the barrier drape method. Appropriate patient identification and timeout procedures were then completed. A proximal incision was made centered over the greater trochanter just proximal to it and and the soft tissues spread. A guidewire was inserted, checking on AP and lateral images to get in the center position. When adequate position was obtained and the pin advanced, the proximal reamer was placed and a guidewire was then inserted down the canal.   This was measured after viewing the length of the rod at the knee. Reaming was carried out to 13 mm and a 130-degree right Affixus nail, 380 mm in length, was inserted down the canal. A  lateral incision was made and the guidewire was inserted into the inferior center portion of the head with an antirotation drill placed above this. The antirotation screw was placed followed by the lag screw. Proximal set screw was placed, backing out slightly to allow for further compression after releasing traction and applying  compression through the device.   Next, the insertion handle was removed and the wound was thoroughly irrigated. AP and lateral proximal hip images were then saved. The wounds were closed after thorough irrigation with 2-0 Vicryl subcutaneously and skin staples. Xeroform, 4 x 4's, ABD, and tape applied, and the patient was sent to the recovery room in stable condition.   ESTIMATED BLOOD LOSS: 100 mL.   COMPLICATIONS: None.   SPECIMEN: None.   IMPLANTS: Affixus hip fracture nail, lag screw 10.5 x 110, antirotation screw 90 mm, and a right 130 degrees 11 x 380 nail.    ____________________________ Laurene Footman, MD mjm:np D: 08/26/2012 18:11:49 ET T: 08/26/2012 22:20:36 ET JOB#: [id number]  cc: Laurene Footman, MD, <Dictator> Laurene Footman MD ELECTRONICALLY SIGNED 08/27/2012 12:41

## 2014-05-14 NOTE — Op Note (Signed)
PATIENT NAME:  Cindy [name], Cindy [name] MR#:  [id number] DATE OF BIRTH:  Feb 09, 1933  DATE OF PROCEDURE:  09/18/2012  PREOPERATIVE DIAGNOSIS: Right femur fracture.   POSTOPERATIVE DIAGNOSIS: Right femur fracture.   PROCEDURE: Open reduction and internal fixation, right femur.   ANESTHESIA: Spinal.   SURGEON: Laurene Footman, MD  DESCRIPTION OF PROCEDURE: The patient was brought to the operating room, and after adequate spinal anesthesia was obtained, the patient was placed on the fracture table. The left leg in the well legholder, right leg in the traction boot, with slight internal rotation and traction applied, acceptable alignment was obtained under C-arm. The lateral hip was then prepped and draped in the usual sterile fashion using the barrier drape method. After patient identification and timeout procedures were completed, a lateral incision was made distal to the previous screw incision for the prior intramedullary rod. The skin and subcutaneous tissue were incised down to the IT band. The IT band was incised, and the vastus lateralis was elevated anteriorly. A wire passer was then passed from the Synthes set, and a 1.7 mm braided cable with crimp sleeve were passed and tightened, again with C-arm views to make sure this was a tight fixation. This initial wire was crimped, and the excess wire removed. A more proximal wire was then placed just distal to the prior compression screw from a long Affixus nail. This wire was passed in a similar fashion, tightened and crimped. With fluoroscopy, there was no rotation. Under direct visualization and palpation, the fracture appeared stable. The wound was thoroughly irrigated. The IT band was repaired using #1 Vicryl in a figure-of-eight fashion, 2-0 Vicryl subcutaneously and skin staples. Xeroform, 4 x 4's, ABD and tape were applied.   ESTIMATED BLOOD LOSS: 50 mL.   COMPLICATIONS: There were no complications.   SPECIMEN: No specimen.   IMPLANTS: Synthes  cerclage wires 1.7 mm with crimp.   CONDITION: To recovery room stable.  ____________________________ Laurene Footman, MD mjm:OSi D: 09/18/2012 22:33:10 ET T: 09/19/2012 07:51:39 ET JOB#: [id number]  cc: Laurene Footman, MD, <Dictator> Laurene Footman MD ELECTRONICALLY SIGNED 09/21/2012 18:34

## 2014-05-14 NOTE — Consult Note (Signed)
PATIENT NAME:  Cindy [name], Cindy [name] MR#:  [id number] DATE OF BIRTH:  05-01-1933  DATE OF CONSULTATION:  08/26/2012  CONSULTING PHYSICIAN:  Laurene Footman, MD  REASON FOR CONSULTATION: Right hip fracture.   HISTORY OF PRESENT ILLNESS: The patient is a 81 year old poorly controlled diabetic who has been having repeated falls. She fell at home last night. She does live independently and has been on a walker or cane, but does not always use them. She suffered a fall and suffered a displaced fracture. She was brought to the Emergency Room, where she was admitted with right intertrochanteric fracture. She has not had any prior complaints of hip pain. No prodromal symptoms. Denies loss of consciousness. 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  PHYSICAL EXAMINATION: EXTREMITIES: Skin is intact in the right thigh. She has a shortened and externally rotated lower extremity with a trace dorsalis pedis pulse. She is able to feel light touch to the foot and able to flex and extend her toes.   RADIOLOGICAL DATA: X-rays reveal a basilar neck fracture with lesser trochanter avulsed, with displacement and shortening.  CLINICAL IMPRESSION: Unstable high intertrochanteric hip fracture.   RECOMMENDATION: For ORIF with cephalomedullary nail. Plan on doing that later today and expect she will require rehab stay with her significant medical problems and living alone. Plan on her being weight-bear as tolerated postop. With the unstable fracture pattern, I did discuss the potential failure of fixation, although not likely, as well as the risk of infection and blood clot.  ____________________________ Laurene Footman, MD mjm:OSi D: 08/26/2012 08:49:33 ET T: 08/26/2012 09:13:24 ET JOB#: [id number]  cc: Laurene Footman, MD, <Dictator> Laurene Footman MD ELECTRONICALLY SIGNED 08/26/2012 9:46

## 2014-05-14 NOTE — Discharge Summary (Signed)
PATIENT NAME:  Cindy [name], Cindy [name] MR#:  [id number] DATE OF BIRTH:  [birth year]  DATE OF ADMISSION:  06/16/2012 DATE OF DISCHARGE:  06/19/2012.   ADMITTING DIAGNOSIS:  Shortness of breath and generalized weakness.   DISCHARGE DIAGNOSES:  1.  Shortness of breath and weakness due to acute on chronic obstructive pulmonary disease exacerbation.  2.  Generalized weakness due to deconditioning. The patient is now improved.  3.  Lung cancer, squamous cell, status post left upper lobe lingulectomy in 2006.  4.  Status post cholecystectomy.  5.  Diabetes, very poorly controlled with blood sugars in the 300s, started on Lantus and metformin.  6.  Hypercholesterolemia.  7.  Hypertension.  8.  Gastroesophageal reflux disease.  9.  A history of atrial fibrillation status post lung surgery converted to normal sinus rhythm with amiodarone.  10.  Bronchitis.  11.  Allergic rhinitis.  12.  Hypothyroidism.  13.  Status post cholecystectomy. 14.  Status post lung surgery.   EVALUATIONS:  Done by PT/OT as well as Case Management.   PERTINENT LABS AND EVALUATIONS:  Admitting EKG showed sinus tachycardia, CK-MB 0.9. Troponin less than 0.02. BMP:  Glucose was 331, BUN 15, creatinine 0.72, sodium 136, potassium 4.5, chloride 101, CO2 was 26. LFTs showed an AST of 43 and total protein 8.3, albumin of 3.3. Her WBC count was 13.0, hemoglobin 14 and platelet 268. CT scan of the head showed no acute abnormality. PA and lateral chest x-ray:  Cardiomegaly with normal pulmonary vascularity.   HOSPITAL COURSE:  Please refer to H and P done by the admitting physician. The patient is a 81 year old white female with a history of COPD, not on home O2, has diabetes, hypertension, presented with multiple falls, shortness of breath. The patient was evaluated and was thought to have acute COPD exacerbation. The patient was admitted, placed on antibiotics, nebulizers and IV Solu-Medrol. With that her shortness of breath is significantly  improved. In terms of her recurrent falls, she is very weak and is requiring further rehab, therefore a skilled nursing facility placement is recommended with the rehab. The patient also has diabetes and her sugars were elevated. Hemoglobin A1c was in the 9 range. She was started on metformin and Lantus. The prednisone and the steroids also are exacerbating her diabetes. At this time, she is stable for rehab.   DISCHARGE MEDICATIONS: 1.  Lovastatin 20 daily. 2.  Spiriva 18 mcg inhalation daily. 3.  Metoprolol succinate 50 mg daily. 4.  Colace 100, 1 tab p.o. b.i.d. 5.  Glimepiride 2 mg 2 times a day. 6.  Losartan 100 daily. 7.  Prednisone taper 60 mg and taper x 10 mg until complete,  8.  Metformin 1000 mg 1 tab p.o. b.i.d. 9.  Lantus 10 units subcutaneous at bedtime. 10.  Insulin sliding scale. 11.  Albuterol nebulizers as needed. 12.  Nystatin one application topically S.85. 13.  Advair 250/50 one puff b.i.d.   OXYGEN:  2 liters.   DIET:  Low fat, low cholesterol, carbohydrate-controlled diet.   ACTIVITY:  As tolerated with PT evaluation and treatment.   FOLLOW UP:  Follow up 1 to 2 weeks with primary physician.    TIME SPENT:  35 minutes spent on the discharge.    ____________________________ Lafonda Mosses. Posey Pronto, MD shp:jm D: 06/19/2012 12:16:43 ET T: 06/19/2012 12:52:45 ET JOB#: [id number]  cc: Karrine Kluttz H. Posey Pronto, MD, <Dictator> Alric Seton MD ELECTRONICALLY SIGNED 06/19/2012 14:58

## 2014-05-14 NOTE — H&P (Signed)
PATIENT NAME:  Cindy [name], Cindy [name] MR#:  [id number] DATE OF BIRTH:  [birth year]  DATE OF ADMISSION:  08/25/2012  PRIMARY CARE PHYSICIAN:  Dr. Einar Pheasant.   REFERRING PHYSICIAN:  Dr. Harvest Dark.   CHIEF COMPLAINT:  Right hip pain status post fall.   HISTORY OF PRESENT ILLNESS:  Cindy [name] is a 81 year old Caucasian female with a history of frequent falls secondary to generalized deconditioning.  Last admission to this hospital was on May 26 when she was admitted with generalized weakness and frequent falls.  She had mild COPD exacerbation at that time.  The patient lives at home with her daughter.  She was in her usual state of health until this morning when she went to the bathroom, she slipped and fell to the floor.  She does not recall the details of how she fell, but she remained on the floor until the evening around 5:00 p.m. when her daughter arrived and found her on the floor of the bathroom.  She was unable to stand up and walk due to right hip pain.  EMS was called and the patient was transported to the Emergency Department where she was found to have right hip fracture.  The patient does not have head trauma or pain elsewhere in her body.   REVIEW OF SYSTEMS:  CONSTITUTIONAL:  Denies any fever.  No chills.  She has mild fatigue.  EYES:  No blurring of vision.  No double vision.  EARS, NOSE, THROAT:  No hearing impairment.  No sore throat.  No dysphagia.  CARDIOVASCULAR:  No chest pain.  No shortness of breath.  No palpitations.  RESPIRATORY:  No cough.  No sputum production.  No chest pain.  No shortness of breath.  GASTROINTESTINAL:  No abdominal pain, no vomiting, no diarrhea.  GENITOURINARY:  No dysuria.  No frequency of urination.  MUSCULOSKELETAL:  Apart from the right hip pain she has no other joint pain or swelling.  No muscular pain or swelling.  INTEGUMENTARY:  No skin rash.  No ulcers, but she has scattered ecchymotic skin lesions.  Ecchymotic from ecchymosis.  NEUROLOGY:   No focal weakness.  No seizure activity.  No headache.  PSYCHIATRY:  No anxiety.  No depression.  ENDOCRINE:  No polyuria or polydipsia.  No heat or cold intolerance.   PAST MEDICAL HISTORY:  Last admission to this hospital was on May 26 with generalized deconditioning and frequent falls and COPD exacerbation.  Her other medical problems includes chronic obstructive pulmonary disease, diabetes mellitus type 2 uncontrolled.  Last admission she was discharged on Lantus 10 units at night, however the patient is not taking it.  The patient or her daughter did not give me real reason why she did not use it.  They just ignored the recommendation.  History of lung cancer, squamous cell, status post left upper lobe lobectomy in 2006.  Hypercholesterolemia, systemic hypertension, gastroesophageal reflux disease and history of an episode of atrial fibrillation after her lung surgery, converted to sinus rhythm after using amiodarone.  History of hypothyroidism, but the patient currently is not taking any replacement.   PAST SURGICAL HISTORY:  Lung surgery for cancer removal.  Cholecystectomy.   FAMILY HISTORY:  Her father suffered from stroke and Alzheimer dementia.  He died at age of 13.  Her mother suffered from diabetes mellitus.   SOCIAL HABITS:  Ex-chronic smoker.  She quit either 8 or 10 years ago.  Prior to that, she used to smoke 2 packs a day since  the age of 40.  No history of alcohol or other drug abuse.   SOCIAL HISTORY:  She is widowed, lives at home with her daughter.  She does not have a LIVING WILL, however her CODE STATUS IS FULL CODE.   ADMISSION MEDICATIONS:  Spiriva 1 inhalation once a day, Advair Diskus 250/50 1 inhalation twice a day, losartan 100 mg once a day, metoprolol succinate 50 mg once a day.  Lovastatin 20 mg a day, glimepiride 2 mg twice a day.  Colace 100 mg a day, albuterol inhalation 2 puffs q. 6 hours as needed.  Again, last admission here the patient was placed on Lantus 10  units, however she is not taking it.  She was also placed on metformin, but she developed skin itching and this was discontinued.   ALLERGIES:  No known drug allergies.   PHYSICAL EXAMINATION: VITAL SIGNS:  Blood pressure 189/83, respiratory rate 18, pulse 101, temperature 96, oxygen saturation 95%.  GENERAL APPEARANCE:  Elderly female lying in bed in no acute distress.  HEAD AND NECK:  No pallor.  No icterus.  No cyanosis.  Ear examination revealed normal hearing, no discharge, no lesions.  Examination of the nose showed no bleeding, no ulcers, no discharge.  Oropharyngeal examination revealed normal lips and tongue.  She has no dentures, usually she has dentures for the upper jaw only.  Eye examination revealed normal eyelids and conjunctivae.  Pupils about 4 mm, round, equal and sluggishly reactive to light.  NECK:  Supple.  Trachea at midline.  No thyromegaly.  No cervical lymphadenopathy.  No masses.  HEART:  Revealed normal S1, S2.  No S3, S4.  No murmur.  No gallop.  No carotid bruits.  RESPIRATORY:  Revealed normal breathing pattern without use of accessory muscles.  No rales.  No wheezing.  ABDOMEN:  Soft without tenderness.  No hepatosplenomegaly.  No masses.  No hernias.  SKIN:  Revealed no ulcers.  No subcutaneous nodules.  There are scattered ecchymotic skin lesions, especially upper extremities.  MUSCULOSKELETAL:  No joint swelling.  No clubbing.  She has pain and tenderness over the right hip area.  The right leg is externally rotated.  NEUROLOGIC:  Cranial nerves II through XII are intact.  No focal motor deficit.  PSYCHIATRIC:  The patient is alert, oriented to the people and place.   LABORATORY FINDINGS:  CAT scan of the head showed no acute intracranial abnormality.  CT scan of the cervical spine showed no evidence of fracture.  Chest x-ray showed heart size is enlarged.  No acute cardiopulmonary abnormalities.  X-ray of the right hip showed findings consistent with right hip  fracture.  EKG showed normal sinus rhythm at a rate of 98% per minute.  Unremarkable EKG.  Serum glucose was 322, BUN 15, creatinine 0.8, sodium 136, potassium 3.9.  CBC showed a white count of 15,000, hemoglobin 13, hematocrit 38, platelet count 349.  Prothrombin time 13.  INR 1.  Urinalysis was unremarkable.   ASSESSMENT: 1.  Right hip fracture status post mechanical fall.  2.  Leukocytosis.  This is likely reactive to the fracture and there is no evidence of infection.  3.  Diabetes mellitus type 2, uncontrolled.  4.  Chronic obstructive pulmonary disease, stable.  5.  Systemic hypertension, uncontrolled.  6.  Hypercholesterolemia.  7.  History of lung cancer, status post resection.  8.  History of paroxysmal atrial fibrillation, one episode after her lung surgery, converted to sinus rhythm.  9.  History  of cholecystectomy.   PLAN:  We will admit the patient and consultation with orthopedic surgeon.  Pain control.  I will give the patient 8 units of NovoLog and place her on Accu-Chek and sliding scale.  Continue losartan and I will give her an additional dose of metoprolol succinate 50 mg tonight.  Ted stockings were already ordered by the orthopedic team.  This is for deep vein thrombosis prophylaxis, however postoperatively the patient will be placed on the usual protocol for deep vein thrombosis prophylaxis.  The patient indicates that she has no LIVING WILL.  She did not appoint anybody to have the power of attorney.  Her CODE STATUS IS FULL CODE.   ____________________________ Clovis Pu. Lenore Manner, MD amd:ea D: 08/25/2012 23:37:06 ET T: 08/26/2012 03:46:24 ET JOB#: [id number]  cc: Clovis Pu. Lenore Manner, MD, <Dictator> Ellin Saba MD ELECTRONICALLY SIGNED 08/26/2012 7:02

## 2014-05-14 NOTE — Op Note (Signed)
PATIENT NAME:  Cindy [name], Cindy [name] MR#:  [id number] DATE OF BIRTH:  1933/09/22  DATE OF PROCEDURE:  10/13/2012  PREOPERATIVE DIAGNOSIS: Right femur fracture, spiral.   POSTOPERATIVE DIAGNOSIS:  Right femur fracture, spiral.   PROCEDURE: ORIF with cerclage wire and interlocking screws into the prior intramedullary rod.   ANESTHESIA: Spinal.   SURGEON: Laurene Footman, MD  DESCRIPTION OF PROCEDURE: The patient was brought to the Operating Room and after adequate anesthesia was obtained, the patient was placed on the operative table with internal rotation for correction of the deformity. The hip was prepped and draped in the usual sterile fashion and after appropriate patient identification and timeout procedures were completed, a small lateral incision was made over the distal end of the fracture. A single and stainless steel cable and sleeve were placed through this incision to compress the fracture, with essentially anatomic alignment obtained. The sleeve was crimped and the excess wire cut. Next, the lateral view of the distal femur was obtained and two small incisions were made, drilled and then a 50 mm cortical screws placed. AP lateral projections showed good position of the screws and wounds were irrigated, closed with #1 Vicryl for the deep fascia, 2-0 Vicryl subcutaneously and skin staples. Xeroform, 4 x 4's, ABD, and tape applied.   ESTIMATED BLOOD LOSS: 50 mL.   COMPLICATIONS: None.   SPECIMEN: None.   ____________________________ Laurene Footman, MD mjm:cc D: 10/13/2012 23:46:29 ET T: 10/14/2012 00:37:29 ET JOB#: [id number]  cc: Laurene Footman, MD, <Dictator> Laurene Footman MD ELECTRONICALLY SIGNED 10/14/2012 1:41

## 2014-05-14 NOTE — H&P (Signed)
PATIENT NAME:  Cindy [name], Cindy [name] MR#:  [id number] DATE OF BIRTH:  01/22/1934  DATE OF ADMISSION:  09/18/2012  REFERRING PHYSICIAN: Valli Glance. Owens Shark, MD  PRIMARY CARE PHYSICIAN: Einar Pheasant, MD   CHIEF COMPLAINT: Right hip pain status post fall.   HISTORY OF PRESENT ILLNESS: Ms. Zuno is a 81 year old female with significant past medical history of COPD, on home oxygen, diabetes mellitus, hypertension, hyperlipidemia. The patient had recent admission at [facility] due to fall, status post total right hip replacement by Dr. Rudene Christians. The patient was in skilled nursing facility for subacute rehab, where she had a fall, where the patient was complaining of right hip pain after the fall. The patient denies any lightheadedness, any syncope, any near-syncope or altered mental status. It was a mechanical fall. The patient presents with right hip area and right leg pain, where she was found to have right femur fracture on x-ray. Hospitalist service was requested to admit the patient for further need for surgical intervention and surgical repair of her fracture.   PAST MEDICAL HISTORY:  1. Recent right hip fracture.  2. COPD, on 2 liters nasal cannula.  3. Cancer, squamous cell, with left upper lobe lingulectomy in 2006.  4. Cholecystectomy.  5. History of bladder cancer, with the patient being on chemotherapy in 2003.  6. Hypercholesterolemia.  7. Hypertension.  8. Gastroesophageal reflux disease.  9. History of atrial fibrillation. The patient converted to normal sinus rhythm with amiodarone.  10. History of bronchitis.  11. Allergic rhinitis.  12. Hypothyroidism.   PAST SURGICAL HISTORY:  1. Cholecystectomy.  2. Lung surgery due to cancer.  3. Recent right hip replacement.   FAMILY HISTORY: Father died at the age of 59, had history of CVA and Alzheimer's. Mother had history of diabetes.   SOCIAL HISTORY: The patient usually lives at home with her daughter, but currently she is in subacute  rehab currently. Used to have history of smoking in the past. No alcohol use.   ALLERGIES: No known drug allergies.   HOME MEDICATION:  1. Tylenol 650 every 4 hours as needed.  2. Tylenol/hydrocodone 5/325 one tablet every 4 to 6 hours as needed.  3. Losartan 100 mg oral daily.  4. Glimepiride 2 mg 2 times a day.  5. Insulin sliding scale.  6. Lovastatin 20 mg oral daily.  7. Metoprolol succinate 50 mg oral daily.  8. Advair Diskus 250/50 one puff b.i.d.  9. Albuterol as needed.  10. Spiriva 18 mcg inhalational daily.  11. Nystatin powder apply to affected area every 12 hours.  12. Ferrous sulfate 325 mg oral b.i.d.  13. Colace 100 mg oral daily.  14. Docusate/senna 1 tablet every 12 hours.  15. Calcium with vitamin D 1 tablet oral daily.   REVIEW OF SYSTEMS:  CONSTITUTIONAL: The patient denies fever, weight gain, weight loss, fatigue.  EYES: Denies blurry vision, double vision, pain, inflammation.  ENT: Denies tinnitus, ear pain, hearing loss, epistaxis or discharge.  RESPIRATORY: Denies cough, wheezing, dyspnea. Has history of COPD, on chronic oxygen.  CARDIOVASCULAR: Denies chest pain, edema, palpitation.  GASTROINTESTINAL: Denies nausea, vomiting, diarrhea, abdominal pain, melena.  GENITOURINARY: Denies dysuria, hematuria, renal colic. ENDOCRINE: Denies polyuria, polydipsia, heat or cold intolerance.  HEMATOLOGY: Denies easy bruising, bleeding diathesis.  INTEGUMENTARY: Denies acne, rash or skin lesions.  MUSCULOSKELETAL: Complains of right hip pain, ambulates with a walker.  NEUROLOGIC: Denies any headache, vertigo, tremors, migraine.  PSYCHIATRIC: Denies any anxiety, insomnia, substance or alcohol abuse.   PHYSICAL  EXAMINATION:  VITAL SIGNS: Temperature 97.1, pulse 80, respiratory rate 20, blood pressure 180/85, saturating 93% on room air.  GENERAL: Elderly female lying in bed, in no apparent distress.  HEENT: Head atraumatic, normocephalic. Pupils equally reactive to  light. Pink conjunctivae. Anicteric sclerae. Moist oral mucosa.  NECK: Supple. No thyromegaly. No JVD. No cervical lymphadenopathy. Trachea is midline.  CARDIOVASCULAR: S1, S2 heard. No rubs, murmur or gallops. No carotid bruits.  RESPIRATORY: The patient had good air entry bilaterally. No wheezing, rales, rhonchi. Had no use of accessory muscles.  ABDOMEN: Soft, nontender, nondistended. Bowel sounds present. No hepatosplenomegaly.  SKIN: No ulcers. No ecchymosis. No skin lesions.  MUSCULOSKELETAL: No joint swelling. No clubbing. The patient has pain and tenderness over the right upper thigh and hip area. The right leg is shorter and externally rotated.  NEUROLOGIC: Cranial nerves grossly intact. Motor 5 out of 5. No focal deficits.  PSYCHIATRIC: The patient is awake, alert, oriented to self, place and people.   PERTINENT LABORATORY: CBC and BMP are pending. EKG showing normal sinus rhythm at 79 beats per minute, without ST or T wave changes.   ASSESSMENT AND PLAN:  1. Right femur fracture. ED consulted ortho, Dr. Rudene Christians, who will see the patient in a.m. Very likely the patient will need surgical intervention this morning. The patient has no complaints of shortness of breath. No chest pain. Has no EKG changes. Still awaiting the patient's labs, CBC and BMP, but given if labs were essentially within normal limits, the patient is considered moderate risk for surgical intervention. Pain medicine and deep vein thrombosis medications as per ortho team.  2. Diabetes mellitus. Will hold oral hypoglycemic agents. Will continue the patient on insulin sliding scale.  3. History of chronic obstructive pulmonary disease, appears to be stable. The patient is not in any exacerbation at this point. Will continue her on Advair and Spiriva as per her home medications and will have her on p.r.n. albuterol.  4. Hypertension, uncontrolled. This is most likely related to pain. Will resume her back on her home medications,  lisinopril and metoprolol and will have her on p.r.n. hydralazine as well.  5. Deep vein thrombosis prophylaxis as per primary orthopedic team.  CODE STATUS: The patient is full code, as per family, sister and daughter at bedside, and they report she does not have a living will, and she has no health care power of attorney.   TOTAL TIME SPENT ON ADMISSION AND PATIENT CARE: 55 minutes.   ____________________________ Albertine Patricia, MD dse:OSi D: 09/18/2012 04:01:13 ET T: 09/18/2012 06:06:15 ET JOB#: [id number]  cc: Albertine Patricia, MD, <Dictator> DAWOOD Graciela Husbands MD ELECTRONICALLY SIGNED 09/20/2012 7:10

## 2014-05-14 NOTE — Discharge Summary (Signed)
PATIENT NAME:  Cindy [name], Cindy [name] MR#:  [id number] DATE OF BIRTH:  Jul 20, 1933  DATE OF ADMISSION:  10/11/2012 DATE OF DISCHARGE:  10/16/2012   ADMITTING DIAGNOSIS: Right femur fracture.   DISCHARGE DIAGNOSES:  1. Right femoral shaft fracture, status post open reduction and internal fixation on 10/13/2012 by Dr. Rudene Christians.  2. History of hypertension.  3. Diabetes mellitus.  4. Chronic obstructive pulmonary disease, on 2 liters of oxygen through nasal cannula.  5. Postoperative acute posthemorrhagic anemia.  6. Postoperative delirium.  7. Pruritus, which is chronic.  8. Hypokalemia, hypomagnesemia, replenished.  9. History of recent right hip fracture x2 in August, status post open reduction and internal fixation of right intratrochanteric hip fracture on 08/26/2012 and open reduction and internal fixation of right femur on 09/18/2012.  10. Squamous cell carcinoma of left upper lobe status post lingulectomy in 2006.  11. Cholecystectomy.  12. History of bladder cancer, status post chemotherapy in 2003.  13. Hyperlipidemia.  14. Gastroesophageal reflux disease.  15. History of atrial fibrillation.  16. Bronchitis.  17. Allergic rhinitis.  18. Hypothyroidism.  DISCHARGE CONDITION: Stable.   DISCHARGE MEDICATIONS: The patient is to resume:  1. Lovastatin 10 mg p.o. daily.  2. Metoprolol succinate 50 mg daily.  3. Losartan 100 mg daily.  4. Nystatin 100,000 units in 1 gram of topical cream 1 application twice daily.  5. Advair Diskus 250/50 one inhalation twice daily.  6. Calcium with vitamin D 500/200 one tablet twice daily.  7. Iron sulfate 325 mg p.o. twice daily.  8. Folic acid 0.4 mg daily.  9. Glimepiride 2 mg p.o. twice daily.  10. Hydroxyzine hydrochloride 25 mg 50 mg/2 tablets 3 times daily.  11. Metformin 500 mg p.o. twice daily.  12. Mometasone topical cream 0.1% topically to affected area twice daily as needed.  13. Ranitidine 150 mg p.o. twice daily.  14. Spiriva 18 mcg  inhalation daily.  15. Vitamin B12 1000 mcg tablets 2 tablets which would be 2000 mcg once daily.  16. Vitamin D3 1000 units once daily.  17. Cetirizine 10 mg p.o. daily as needed.  18. Aspirin 325 mg p.o. daily for 14 days.  19. Acetaminophen/hydrocodone 325 mg/5 mg 1 tablet every 4 hours as needed.  20. Senna 1 tablet twice daily as needed.  21. Docusate sodium 100 mg p.o. twice daily as needed.  22. TriCalm ointment 1 application topically twice daily as needed for itching.   DIET: Low salt, low fat, low cholesterol, carbohydrate-controlled diet. Sugar-free Carnation Instant Breakfast 3 times daily between meals. Mechanical soft.   ACTIVITY LIMITATIONS: As tolerated.   HOME OXYGEN: 2 liters of oxygen through nasal cannula.   REFERRAL: To physical therapy 2 to 7 times a week.    FOLLOWUP APPOINTMENT: With Dr. Einar Pheasant in 2 days after discharge. Also, follow up with Dr. Rudene Christians in 1 week after discharge.   CONSULTANTS: Laurene Footman, MD. Also Dorise Hiss, PA-C. Social work. Care management.   RADIOLOGIC STUDIES: Right hip complete x-ray on 10/11/2012 revealed interval placement of femoral intramedullary nail transfixing a comminuted proximal diaphyseal fracture with 2 proximal femoral neck screws present, transfixing a femoral neck fracture. There are 2 cerclage wires intact. The fractures are in near-anatomic alignment. Repeated x-ray with C-arm of 2 views of right hip on 10/13/2012 showed intramedullary rod in femur with 2 fixation screws distally with a cerclage wire in the midportion. The proximal portion is not included.   HISTORY OF PRESENT ILLNESS: The patient is a  81 year old Caucasian female with history of recent falls and hip as well as femur fractures in August, which were repaired by Dr. Rudene Christians, who presents back to the hospital after a fall with right hip pain. Please refer to Dr. Gus Height Patel's admission note on 10/11/2012. On arrival to the Emergency Room, the patient was  noted to have right femoral shaft fracture, and she was admitted to the hospital for Dr. Rudene Christians to repair. On arrival to the Emergency Room, the patient's vital signs were stable.   LABORATORY DATA: Labs done on 10/11/2012 revealed mild elevation of BUN to 22, glucose 182, otherwise BMP was unremarkable. The patient's liver enzymes revealed albumin level of 3.2. CBC was unremarkable with white blood cell count 11.0, hemoglobin 12.7, platelet count 242. Her coagulation panel was normal with pro time 12.8, INR 0.9 and activated PTT 26.9. Urinalysis was also normal. EKG showed normal sinus rhythm. No ST-T changes.   HOSPITAL COURSE: The patient was admitted to the hospital for further evaluation and treatment. She underwent open reduction and internal fixation with cerclage wire and interlocking screws into the prior intramedullary rod on 10/13/2012. Postoperatively, she did quite well. She did not have significant discomfort or pains and was able to ambulate with physical therapy. She was deemed to be stable for discharge to rehabilitation facility on 10/16/2012 for physical therapy and treatment of her right femur fracture.   While in the hospital, she had her labs checked, and she was noted to have acute posthemorrhagic anemia with hemoglobin level dropping down by approximately 1 gram since the day of admission. At that time, her hemoglobin level, as mentioned above, was 12.7; however, after surgery, the patient's hemoglobin level was 11.7. Otherwise, the patient's platelets as well as white blood cell count remained stable. It is recommended to continue iron supplementation for this patient with mild posthemorrhagic anemia.   Postoperatively, the patient also was noted to be somewhat delirious, confused, trying to get out from the bed, requiring intermittent Haldol administration. However, on the day of discharge, 10/16/2012, she improved and did not have significant problems. We are not discharging her with  Haldol; however, Haldol can be instituted if needed to control her behavior.   Regarding chronic medical problems such as hypertension, diabetes mellitus, chronic obstructive pulmonary disease, the patient was continued on her usual medications; no changes were made.   The patient was noted to be hypokalemic and hypomagnesemic while in the hospital. She received magnesium as well as potassium supplementation, her hypomagnesemia as well as hypokalemia resolved.   She is being discharged in stable condition with the above-mentioned medications and followup. Her vital signs on the day of discharge: Temperature 97.9, pulse is 73, respiratory rate was 18, blood pressure 121/68, saturation was 91% to 95% on room air.  TIME SPENT: 40 minutes.    ____________________________ Theodoro Grist, MD rv:OSi D: 10/16/2012 10:49:48 ET T: 10/16/2012 11:26:56 ET JOB#: [id number]  cc: Theodoro Grist, MD, <Dictator> Einar Pheasant, MD Laurene Footman, MD  Theodoro Grist MD ELECTRONICALLY SIGNED 10/20/2012 9:41

## 2014-05-14 NOTE — H&P (Signed)
PATIENT NAME:  Cindy [name], Cindy [name] MR#:  [id number] DATE OF BIRTH:  1934-01-08  DATE OF ADMISSION:  06/16/2012  REFERRING PHYSICIAN: Charlesetta Ivory, MD  PRIMARY CARE PHYSICIAN: Einar Pheasant, MD  CHIEF COMPLAINT: Falls and shortness of breath.   HISTORY OF PRESENT ILLNESS: This is a 81 year old female with significant past medical history of COPD not on home oxygen, multiple falls, diabetes mellitus, and hypertension who presents with complaints of fall today. The patient's daughter is at her bedside.  She gives most of the history. The patient lives with her daughter who helps take care of her.  Daughter reports the patient has been having multiple falls in the past due to gait disturbance and unsteady gait.  Mother has no loss of consciousness, no altered mental status. The patient was noticed to be hypoxic in the ED sating 88% on room air.  As well had significant wheezing.  Chest x-ray did not show any acute infiltrate. The patient received IV Solu-Medrol and multiple nebulizer treatments which did help her symptoms. The patient has been complaining of cough with productive sputum, white in color, with occasionally green phlegm as well.  The patient's chest x-ray did not show any infiltrates. She had leukocytosis at 13,000. The patient denies any chest pain, any loss of consciousness, any altered mental status, any hemoptysis, any coffee-ground emesis, any lower extremity edema, any orthopnea  Reports her dyspnea is mainly on exertion. Hospitalist service was requested to admit the patient for further management of her COPD exacerbation.   PAST MEDICAL HISTORY: 1.  COPD. 2.  Lung cancer, squamous cell, with left upper lobe lingulectomy in 2006.  3.  Cholecystectomy.  4.  History of bladder cancer with the patient on chemotherapy in 2003.  5.  Hypercholesterolemia.  6.  Hypertension.  7.  Gastroesophageal reflux disease.  8.  History of A-fib status post lung surgery, converted to normal sinus  rhythm with amiodarone.  9.  History of bronchitis.  10.  Allergic rhinitis.  11.  Hypothyroidism.   PAST SURGICAL HISTORY: 1.  Cholecystectomy.  2.  Lung surgery due to cancer.   FAMILY HISTORY: Father died at the age of 93.  Had history of CVA and Alzheimer's. Mother had history of diabetes.   SOCIAL HISTORY: The patient lives at home with her daughter. Used to have history of smoking in the past, currently nonsmoker. No alcohol use. She is retired.   ALLERGIES: NO KNOWN DRUG ALLERGIES.   HOME MEDICATIONS: 1.  Losartan 100 mg oral daily.  2.  Glyburide 2 mg oral 2 times a day.  3.  Lovastatin 20 mg oral daily.  4.  Metoprolol succinate 50 mg extended release oral daily.  5.  Spiriva 18 mcg inhalation daily.  6.  Colace 100 mg oral 2 times a day.   REVIEW OF SYSTEMS:   CONSTITUTIONAL:  The patient denies any fever or chills.  Has generalized weakness and fatigue.  EYES: Denies blurry vision, double vision, pain, or inflammation.  ENT: Denies tinnitus, ear pain, hearing loss, or epistaxis.  RESPIRATORY: Complains of cough with white/green productive sputum. Complains of wheezing, COPD, shortness of breath. Denies any hemoptysis, painful respirations.  CARDIOVASCULAR: Denies chest pain, edema, arrhythmia, palpitations, or syncope.  GASTROINTESTINAL: Denies any nausea, vomiting, diarrhea, abdominal pain, hematemesis, melena, rectal bleed, or jaundice.  GENITOURINARY: Denies dysuria, hematuria, or renal colic.  ENDOCRINE: Denies polyuria, polydipsia, heat or cold intolerance.  HEMATOLOGY: Denies anemia, easy bruising, or bleeding diathesis.  INTEGUMENTARY: Denies any acne, rash,  or skin lesions.  MUSCULOSKELETAL: Denies any gout.  Has history of multiple falls in the past with unsteady gait with knee pain due to fall.  NEUROLOGIC:  Denies any CVA, TIA, seizures, headache, vertigo, dysarthria, or epilepsy. Has generalized muscle weakness and unsteady gait.  PSYCH:  Denies anxiety,  insomnia, bipolar disorder, or schizophrenia.   PHYSICAL EXAMINATION: VITAL SIGNS: Temperature 97.8, pulse 100, respiratory rate 20, blood pressure 150/74, and saturating 96% on oxygen.  GENERAL: Elderly female who looks comfortable in bed, in no apparent distress.  HEENT: Head atraumatic, normocephalic.  Pupils are equal and reactive to light. Pink conjunctivae. Anicteric sclerae. Moist oral mucosa.  NECK: Supple. No thyromegaly. No JVD.  LUNGS:  Good air entry bilaterally with scattered wheezing. No rales or rhonchi.  HEART:  S1 and S2 heard. No rubs, murmurs, or gallops.  ABDOMEN: Soft, nontender, and nondistended. Bowel sounds present.  EXTREMITIES: No edema. No clubbing. No cyanosis. Dorsalis pedis pulses +2.  SKIN: Normal skin turgor. Warm and dry. Had a couple of bruises in the right upper arm from her fall and on the left knee from the fall.  As well has fungal rash in the groin area and in the abdominal folds.  NEUROLOGIC: Cranial nerves grossly intact.  No focal or sensory neurological deficits. Cranial nerves grossly intact. Motor 5 out of 5.   PERTINENT LABORATORY AND DIAGNOSTICS:  Glucose 331, BUN 15, creatinine 0.72, sodium 136, potassium 4.5, chloride 101, CO2 26. White blood cells 13, hemoglobin 15.4, hematocrit 46.9, and platelets 298. Urinalysis negative for leukocyte esterase and nitrites.   CT of cervical spine does not show any evidence of acute fracture.   CT of head does not show any evidence of acute intracranial findings.   ASSESSMENT AND PLAN: 1.  Chronic obstructive pulmonary disease exacerbation. The patient is known to have history of chronic obstructive pulmonary disease.  She will be started on IV Solu-Medrol, on DuoNebs and p.r.n. albuterol. Given the fact she is having productive sputum, she will be started on Levaquin.  Her chest x-ray does not show any infiltrate. She will be continued on her Spiriva as well.  Will be kept on p.r.n. oxygen for target O2 sat of  95%.  2.  Unsteady gait with multiple falls.  This is due to deconditioning and generalized weakness. Will consult PT.  As well, will consult case manager to see if the patient qualifies for any PT at home.  3.  Fungal rash. We will start on p.o. Diflucan and nystatin powder.  4.  Diabetes mellitus. We will hold oral agents. We will have her on insulin sliding scale.  5.  Hypertension. Continue on medication.  6.  Hyperlipidemia. Continue with statin.  7.  Reflux. Continue with PPI. 8.  Deep vein thrombosis prophylaxis. Sub-Q heparin.   CODE STATUS: Discussed with the patient.  She is a FULL CODE. Discussed with her family. Both daughters were at bedside and they are currently working on preparing a living will for the patient, as they stated.    TOTAL TIME SPENT: On admission and patient care 55 minutes.  ____________________________ Albertine Patricia, MD dse:sb D: 06/16/2012 08:26:44 ET T: 06/16/2012 08:46:17 ET JOB#: [id number]  cc: Albertine Patricia, MD, <Dictator> Jerry Haugen Graciela Husbands MD ELECTRONICALLY SIGNED 06/18/2012 2:20

## 2014-05-15 NOTE — Discharge Summary (Signed)
PATIENT NAME:  Cindy [name], Cindy [name] MR#:  [id number] DATE OF BIRTH:  1933/12/09  DATE OF ADMISSION:  04/14/2013 DATE OF DISCHARGE:    ADDENDUM. This is a continuation.   Discharged home on multiple medications.   DISCHARGE MEDICATIONS: Include lovastatin 20 mg once a day, metoprolol 50 mg once a day, losartan 100 mg once a day, Advair Diskus 250/50 b.i.d., ferrous sulfate 903 b.i.d., folic acid 0.4 mg once a day, Spiriva 18 mcg once a day, cetirizine 10 mg once a day, senna 1 capsule 2 times a day, Colace 100 mg b.i.d. p.r.n., Tylenol 650 q.8 hours p.r.n., glimepiride 3 mg twice a day, hydroxyzine hydrochloride 5 mg once a day, Os-Cal calcium 1 tablet b.i.d., vitamin B12 1000 mcg once a day, ranitidine 150 mg once a day, tramadol 50 mg every six hours p.r.n., loperamide 2 mg every four hours, Doxepin 10 mg p.o. at bedtime p.r.n., acetaminophen/hydrocodone 325/5 mg every 4 to 6 hours p.r.n., diazepam 5 mg p.o. q.8 hours p.r.n.   FINAL DISCHARGE DIAGNOSIS: Ventral hernia.   SURGERY: Robot-assisted ventral hernia repair.  ____________________________ Micheline Maze, MD rle:sg D: 04/16/2013 12:11:36 ET T: 04/16/2013 13:56:48 ET JOB#: [id number]  cc: Rodena Goldmann III, MD, <Dictator> Rodena Goldmann MD ELECTRONICALLY SIGNED 04/17/2013 20:08

## 2014-05-15 NOTE — Op Note (Signed)
PATIENT NAME:  Cindy [name], Cindy [name] MR#:  [id number] DATE OF BIRTH:  Jul 13, 1933  DATE OF PROCEDURE:  04/14/2013  PREOPERATIVE DIAGNOSIS:  Ventral hernia.  POSTOPERATIVE DIAGNOSIS:  Ventral hernia.  OPERATION PERFORMED: Robotic-assisted laparoscopic ventral hernia repair.   ANESTHESIA: General.   SURGEON: Rodena Goldmann, M.D.   OPERATIVE PROCEDURE: With the patient in the supine position, after induction of appropriate general anesthesia, the patient's left side was elevated slightly with axillary rolls. The patient's abdomen was prepped with ChloraPrep and draped with sterile towels. A lateral incision was made, and the abdomen cannulated with an Optiview apparatus under direct vision. CO2 was insufflated. Three robotic ports were then placed under direct vision. The robot was brought to the table, docked to the patient, instruments inserted and the camera inserted. There was a small amount of omental fat stuck in the defect. The fat was removed without difficulty using a combination of blunt and Bovie dissection. The sac was then dissected out of the defect without difficulty. The defect was closed using V-Loc suture. Then, under direct vision, needle was passed into the abdominal cavity through the center of the defect. Our Bard Echo mesh 11 cm circular mesh was inserted through the lateral port, grasped with a needle and brought out through the abdominal wall. The balloon was inflated. The mesh was then stitched in place using 0 Ethibond and V-Loc suture. The repair appeared to be satisfactory. The defect was completely covered. The balloon was removed without difficulty.  The area was re-examined and the repair appeared to be satisfactory. The abdomen was desufflated. All ports were withdrawn without difficulty. Skin incisions were closed with 5-0 nylon. The area was infiltrated with 0.25% Marcaine for postoperative pain control. Sterile dressings were applied. An abdominal binder was placed. The patient  returned to the recovery room having tolerated the procedure well. Sponge, instrument and needle count were correct x 2 in the operating room.     ____________________________ Rodena Goldmann III, MD rle:dmm D: 04/14/2013 11:40:11 ET T: 04/14/2013 12:08:41 ET JOB#: [id number]  cc: Rodena Goldmann III, MD, <Dictator> Einar Pheasant, MD Rodena Goldmann MD ELECTRONICALLY SIGNED 04/15/2013 9:40

## 2014-05-15 NOTE — Consult Note (Signed)
PATIENT NAME:  Cindy [name], Cindy [name] MR#:  [id number] DATE OF BIRTH:  [birth year]  DATE OF CONSULTATION:  04/14/2013  REFERRING PHYSICIAN:  Dr. Bronson Ing. CONSULTING PHYSICIAN:  Margree Gimbel G. Anselm Jungling, MD  PRIMARY PHYSICIAN: Einar Pheasant, MD  REASON FOR REFERRAL:  Medical management, status post surgery.   HISTORY OF PRESENT ILLNESS: Cindy [name] is having past medical history of COPD on 2 liters nasal cannula oxygen, cancer squamous cell, left upper lobe lingulectomy in 2006, cholecystectomy, bladder cancer on chemotherapy in 2003, hypercholesterolemia, hypertension, history of A. fib, history of bronchitis, allergic rhinitis and hypothyroidism who is admitted after having a robotic-assisted laparoscopic ventral hernia repair surgery by Dr. Pat Patrick and medical consult was called in for medical management of these issues. I saw the patient in the room. She is postsurgical now so still having somewhat drowsiness due to sedation during the surgery but easily arousable, goes back to sleep very easily and not answering my questions in very good detail but just answers yes or no and then goes back to sleep, but denies any complaint.   PAST MEDICAL HISTORY:  1.  Right hip fracture, underwent ORIF.   2.  COPD, on 2 liters nasal cannula oxygen.  3.  Hypertension.  4.  Hypercholesterolemia.  5.  A. fib.   6.  Bronchitis.  7.  Allergic rhinitis.  8.  Hypothyroidism.  9.  Squamous cell cancer with left upper lingulectomy in 2006.   10.  Cholecystectomy.  11.  History of bladder cancer, on chemotherapy in 2003.   PAST SURGICAL HISTORY: 1.  Cholecystectomy.  2.  Lung surgery due to cancer.  3.  Right hip replacement in August 2014.   FAMILY HISTORY: Father died at age of 38 due to stroke and Alzheimer's, mother had diabetes.   SOCIAL HISTORY: Old smoker, currently does not. Denies alcohol use.   REVIEW OF SYSTEMS: CONSTITUTIONAL: Negative for fever, fatigue, or weight loss.  EYES: No blurring or double  vision, discharge or redness.  EARS, NOSE AND THROAT:  No tinnitus, ear pain or hearing loss.  RESPIRATORY: No cough, wheezing, hemoptysis or shortness of breath.  CARDIOVASCULAR: No chest pain, orthopnea, edema, arrhythmia or palpitations.  GASTROINTESTINAL: No nausea, vomiting, diarrhea, but currently status post surgery so having somewhat dull pain.  LEGS: No edema.  NEUROLOGICAL: She denies any numbness, weakness, tremor or vertigo.  MEDICATION:  1.  Vitamin D3 1000 international units oral tablet once a day.  2.  Vitamin B12 1000 mcg oral tablet once a day.  3.  Tylenol 650 mg oral extended-release every 8 hours.  4.  Tramadol 50 mg oral tablet every 6 hours.  5.  Spiriva 18 mcg inhalation capsule once a day.  6.  Ranitidine 150 mg oral 2 times a day.  7.  Montelukast 10 mg oral once a day.  8.  Calcium plus vitamin D3 500 plus 200 tablet 2 times a day.  9.  Lovastatin 20 mg oral once a day.  10.  Losartan 100 mg oral once a day.  11.  Hydroxyzine hydrochloride 10 mg oral once a day.  12.  Glimepiride 2 mg 1-1/2 tablets 2 times a day.  13.  Folic acid 0.4 mg oral once a day.  14.  Ferrous sulfate 325 mg once a day.  15.  Docusate sodium 100 mg oral 2 times a day.  16.  Cetirizine 100 mg oral once a day.  17.  Advair Diskus 1 puff inhalation 2 times a day.  PHYSICAL EXAMINATION: VITAL SIGNS: Currently, temperature 97.7, pulse 73, respirations 17, blood pressure 133/79 and pulse oximetry is 89% at rest on room air  GENERAL: The patient is currently drowsy but easily arousable. She goes back to sleep after waking up, just answering yes or no to my questions, still under the effect of anesthetic  medication after surgery.  HEENT: Head and neck atraumatic. Conjunctiva pink. Oral mucosa moist.  NECK: Supple. No JVD.  RESPIRATORY: Bilateral clear and equal air entry.  CARDIOVASCULAR: S1, S2 present, regular. No murmur.  ABDOMEN: There is abdominal binder present after surgery and I  do not want to remove it and check inside so limited abdominal examination. There is not much tenderness and bowel sounds were appreciated.  SKIN: No rashes.  LEGS: No edema.  NEUROLOGIC: Power 4 out of 5 in all 4 limbs. She is moving a little bit but this might be affected by her drowsiness also so needs be evaluated later on but she is able to move all 4 limbs and follows commands.   LABORATORY, DIAGNOSTIC AND RADIOLOGICAL DATA: Which were done 2 weeks ago: BUN 21, creatinine 0.87, sodium 137, potassium 4.1, chloride 105 and CO2 of 28. Calcium was 9.  WBC 10.3, hemoglobin 13.9, platelet count 263, MCV is 90. INR is 0.9. I will orders labs for followup.    ASSESSMENT AND SUGGESTED PLAN:  A 81 year old lady who has history of atrial fibrillation, hypertension, hypercholesterolemia, bronchitis and cholecystectomy presented for surgery and is status post surgery now. Medical consult for management of medical issues.  1.  Chronic obstructive pulmonary disease, on chronic 2 liter oxygen use at home as per the records. We will continue oxygen here as-needed basis for O2 saturation that is 88%. Currently, there is no wheezing or exacerbation symptoms so we will continue Spiriva and give nebulizer as-needed basis.  2.  History of hypertension. The patient was taking losartan and metoprolol at home. Blood pressure is under control. We will continue the same.  3.  Diabetes. She was taking glimepiride at home. We will continue the same and put her on insulin sliding scale coverage  4.  History of atrial fibrillation.  Apparently, she is not on any anticoagulation medication but she is on metoprolol. We will continue the same.  5.  Hyperlipidemia. She is taking lovastatin. We will continue the same.   TOTAL TIME SPENT ON THIS MEDICAL CONSULT: Is 45 minutes. We will continue to follow with this patient.  Thank you for providing the opportunity to participate the care of the patient.    ____________________________ Ceasar Lund. Anselm Jungling, MD vgv:cs D: 04/14/2013 14:59:28 ET T: 04/14/2013 15:44:02 ET JOB#: [id number]  cc: Ceasar Lund. Anselm Jungling, MD, <Dictator> Vaughan Basta MD ELECTRONICALLY SIGNED 04/21/2013 18:28

## 2014-05-15 NOTE — Consult Note (Signed)
Brief Consult Note: Diagnosis: Hernia surgery, Htn and DM management.   Patient was seen by consultant.   Consult note dictated.   Orders entered.   Comments: will follow for Htn and DM.  Electronic Signatures: Vaughan Basta (MD)  (Signed 24-Mar-15 14:38)  Authored: Brief Consult Note   Last Updated: 24-Mar-15 14:38 by Vaughan Basta (MD)

## 2014-05-15 NOTE — Discharge Summary (Signed)
PATIENT NAME:  Cindy [name], Cindy [name] MR#:  [id number] DATE OF BIRTH:  April 07, 1933  DATE OF ADMISSION:  04/14/2013 DATE OF DISCHARGE:  04/16/2013  BRIEF HISTORY:  [name] is a 82 year old woman with a large symptomatic midline ventral hernia. She wanted to have repair. After talking with the family and the patient, we elected to proceed with a robot-assisted laparoscopic ventral hernia repair. After appropriate preoperative preparation and informed consent, the patient was taken to surgery on the morning of 04/14/2013. The patient underwent robot-assisted laparoscopic ventral hernia repair. The procedure was uncomplicated. Postoperatively, she had some mild hypoxia related to her chronic obstructive lung disease and some mild pain control issues. We admitted her to the hospital.  Hypoxia improved, but she continued to have significant abdominal pain and nausea. Her symptoms have improved. She is discharged back to the skilled nursing facility at [facility] today. She will be followed in the office.  ____________________________ Micheline Maze, MD rle:dmm D: 04/16/2013 12:09:06 ET T: 04/16/2013 13:12:30 ET JOB#: [id number]  cc: Rodena Goldmann III, MD, <Dictator> Rodena Goldmann MD ELECTRONICALLY SIGNED 04/17/2013 16:60

## 2014-05-18 DIAGNOSIS — E119 Type 2 diabetes mellitus without complications: Secondary | ICD-10-CM | POA: Diagnosis not present

## 2014-05-18 DIAGNOSIS — I10 Essential (primary) hypertension: Secondary | ICD-10-CM | POA: Diagnosis not present

## 2014-05-18 LAB — BASIC METABOLIC PANEL
Anion Gap: 5 — ABNORMAL LOW (ref 7–16)
BUN: 29 mg/dL — ABNORMAL HIGH
CALCIUM: 8.8 mg/dL — AB
Chloride: 106 mmol/L
Co2: 28 mmol/L
Creatinine: 0.98 mg/dL
EGFR (African American): >60
EGFR (Non-African Amer.): 55 — ABNORMAL LOW
Glucose: 162 mg/dL — ABNORMAL HIGH
POTASSIUM: 3.7 mmol/L
SODIUM: 139 mmol/L

## 2014-05-24 ENCOUNTER — Encounter
Admission: RE | Admit: 2014-05-24 | Discharge: 2014-05-24 | Disposition: A | Discharge status: Home / Self Care | Payer: Medicare Other | Source: Ambulatory Visit | Attending: Internal Medicine | Admitting: Internal Medicine

## 2014-06-23 ENCOUNTER — Encounter
Admission: RE | Admit: 2014-06-23 | Discharge: 2014-06-23 | Disposition: A | Discharge status: Home / Self Care | Payer: Medicare Other | Source: Ambulatory Visit | Attending: Internal Medicine | Admitting: Internal Medicine

## 2014-06-29 DIAGNOSIS — N119 Chronic tubulo-interstitial nephritis, unspecified: Secondary | ICD-10-CM | POA: Diagnosis not present

## 2014-06-29 DIAGNOSIS — I4891 Unspecified atrial fibrillation: Secondary | ICD-10-CM | POA: Diagnosis not present

## 2014-06-29 DIAGNOSIS — J441 Chronic obstructive pulmonary disease with (acute) exacerbation: Secondary | ICD-10-CM | POA: Diagnosis not present

## 2014-06-29 DIAGNOSIS — N183 Chronic kidney disease, stage 3 (moderate): Secondary | ICD-10-CM | POA: Diagnosis not present

## 2014-07-08 NOTE — Telephone Encounter (Signed)
I am closing this encounter since it is from 12.12.13. I am sure it has been taking care of since it is now 2016.I am closing this encounter it is from  ° °

## 2014-07-23 ENCOUNTER — Encounter
Admission: RE | Admit: 2014-07-23 | Discharge: 2014-07-23 | Disposition: A | Discharge status: Home / Self Care | Payer: Medicare Other | Source: Ambulatory Visit | Attending: Internal Medicine | Admitting: Internal Medicine

## 2014-07-23 DIAGNOSIS — E119 Type 2 diabetes mellitus without complications: Secondary | ICD-10-CM | POA: Insufficient documentation

## 2014-07-30 IMAGING — CR DG CHEST 2V
1 series · 2 of 2 positions shown · non-contrast
Comparison: none

REASON FOR EXAM: chest pain
COMMENTS:

PROCEDURE:     DXR - DXR CHEST PA (OR AP) AND LATERAL  - July 03, 2012  [DATE]
RESULT:     Comparison: 06/16/2012

[Series 7: w chest pa · 0.14mm/px · 2 of 2 slices shown]
[im 1/2]
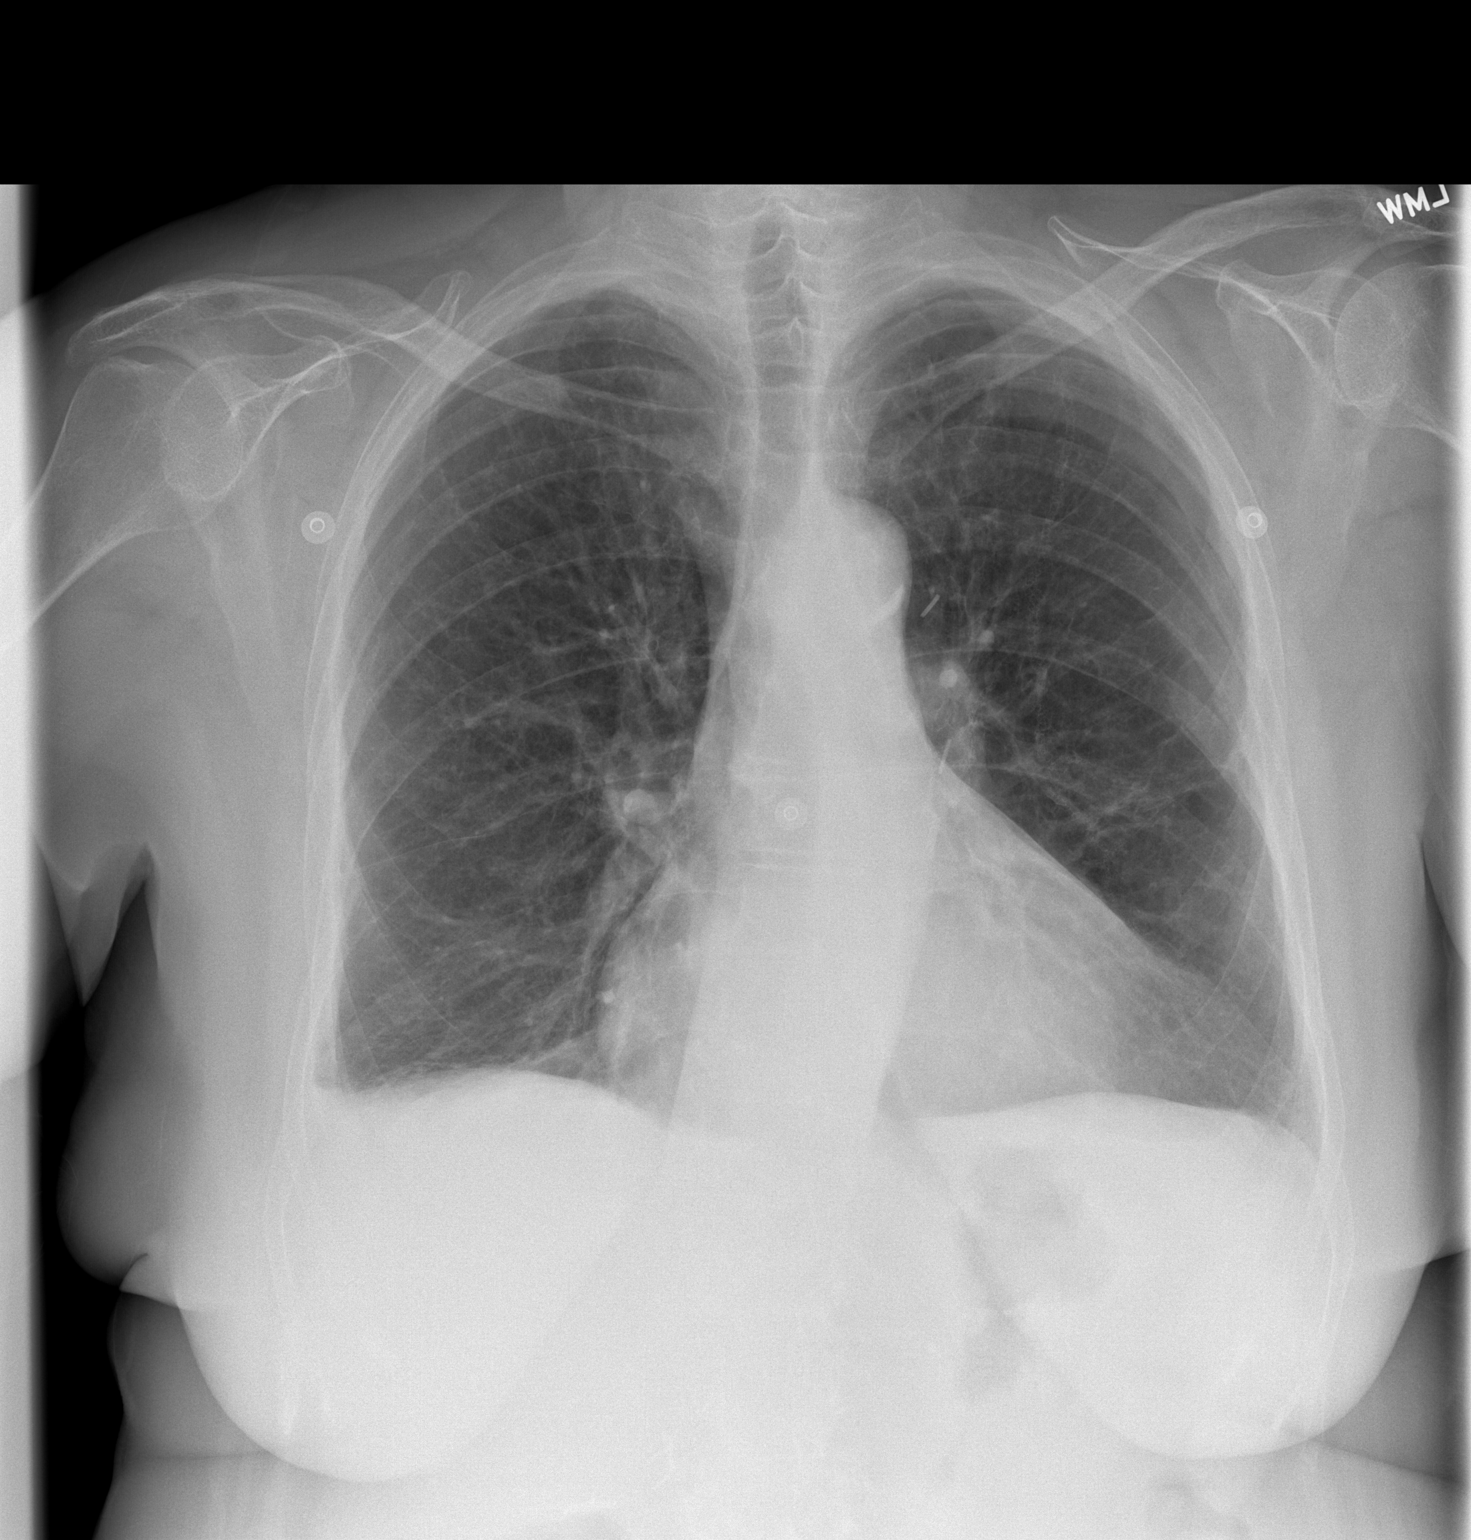
[im 2/2]
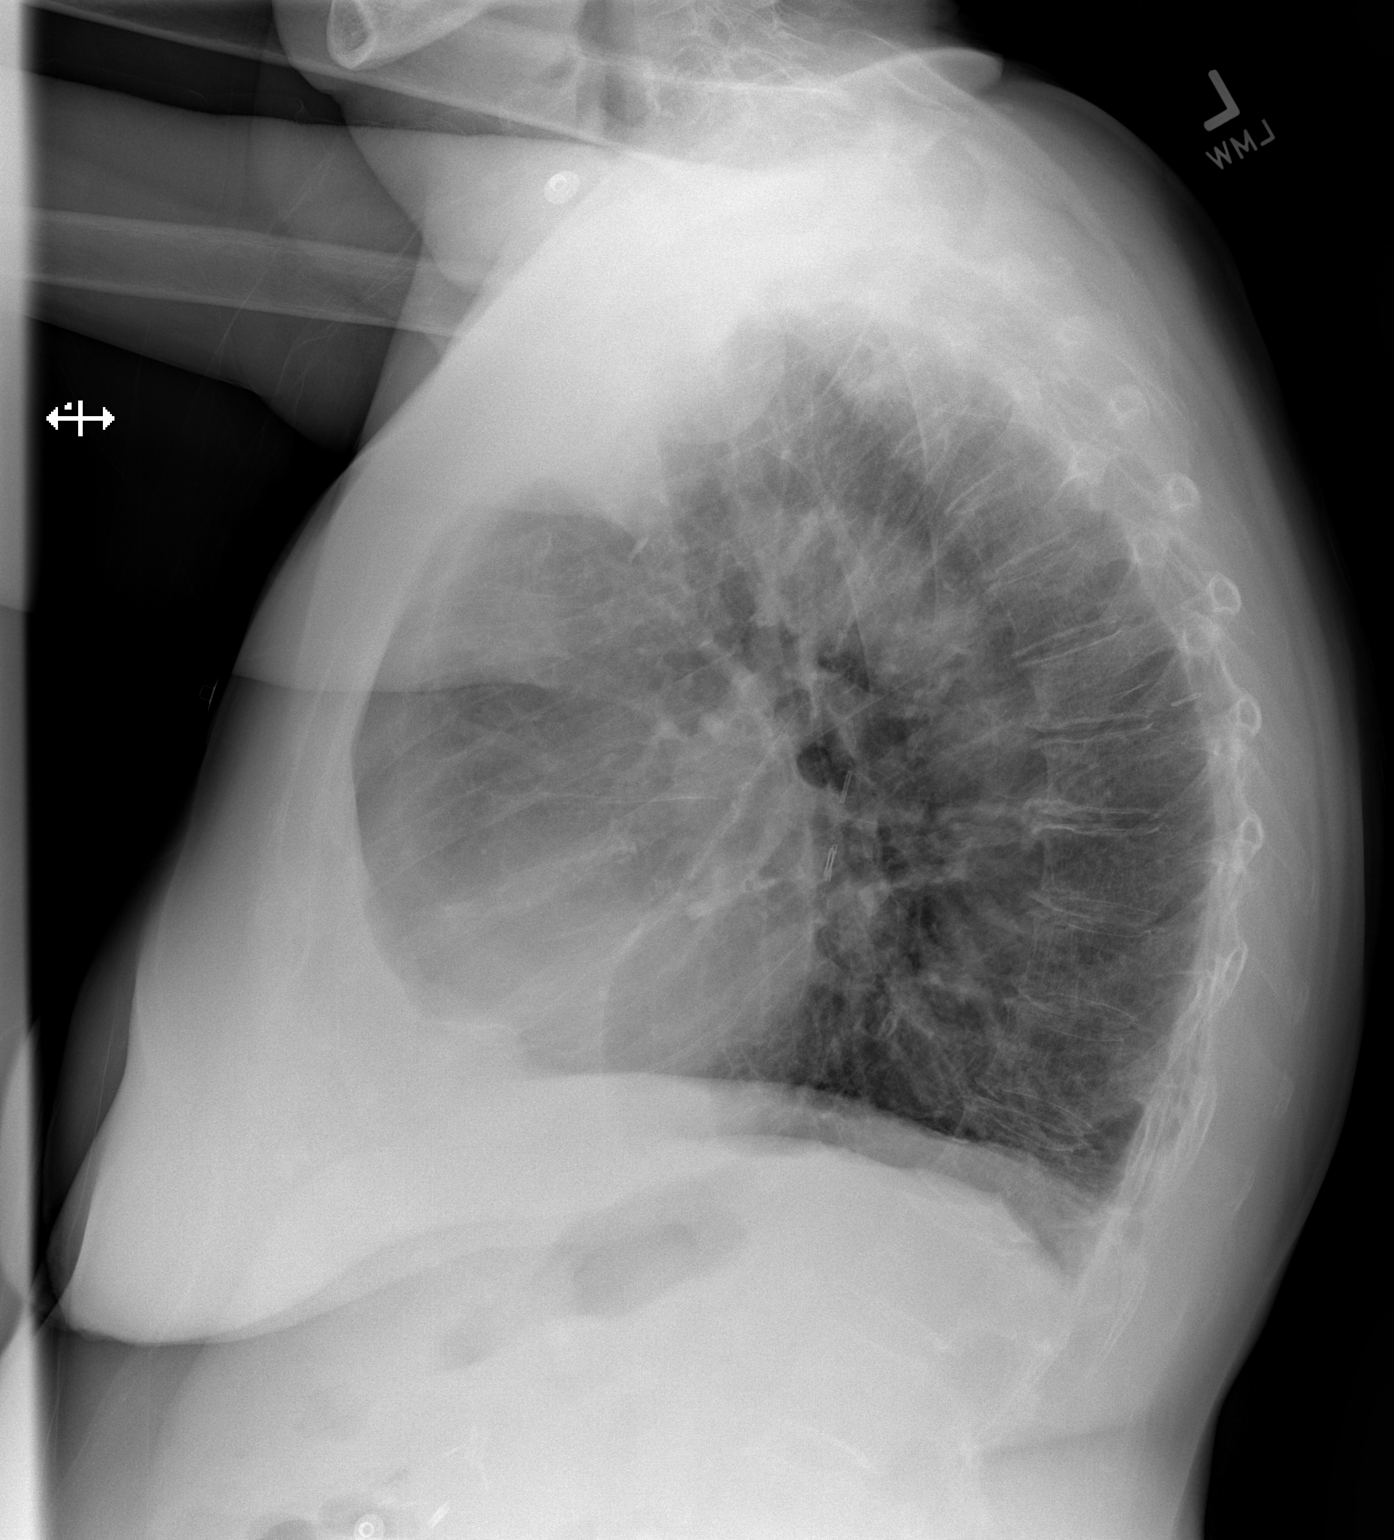

[2 of 2 positions shown; findings below may reference images not displayed]

FINDINGS: PA and lateral chest radiographs are provided.  There is no focal
parenchymal opacity, pleural effusion, or pneumothorax. The heart and
mediastinum are unremarkable.  The osseous structures are unremarkable.
IMPRESSION: No acute disease of the che[REDACTED]

## 2014-08-12 DIAGNOSIS — E119 Type 2 diabetes mellitus without complications: Secondary | ICD-10-CM | POA: Diagnosis not present

## 2014-08-12 LAB — HEMOGLOBIN A1C: Hgb A1c MFr Bld: 7.4 % — ABNORMAL HIGH (ref 4.0–6.0)

## 2014-08-23 ENCOUNTER — Encounter
Admission: RE | Admit: 2014-08-23 | Discharge: 2014-08-23 | Disposition: A | Discharge status: Home / Self Care | Payer: Medicare Other | Source: Ambulatory Visit | Attending: Internal Medicine | Admitting: Internal Medicine

## 2014-08-23 DIAGNOSIS — E119 Type 2 diabetes mellitus without complications: Secondary | ICD-10-CM | POA: Insufficient documentation

## 2014-08-31 DIAGNOSIS — J449 Chronic obstructive pulmonary disease, unspecified: Secondary | ICD-10-CM | POA: Diagnosis not present

## 2014-08-31 DIAGNOSIS — G309 Alzheimer's disease, unspecified: Secondary | ICD-10-CM | POA: Diagnosis not present

## 2014-08-31 DIAGNOSIS — I4891 Unspecified atrial fibrillation: Secondary | ICD-10-CM | POA: Diagnosis not present

## 2014-09-12 DIAGNOSIS — E119 Type 2 diabetes mellitus without complications: Secondary | ICD-10-CM | POA: Diagnosis not present

## 2014-09-12 LAB — GLUCOSE, CAPILLARY: GLUCOSE-CAPILLARY: 152 mg/dL — AB (ref 65–99)

## 2014-09-19 DIAGNOSIS — E119 Type 2 diabetes mellitus without complications: Secondary | ICD-10-CM | POA: Diagnosis not present

## 2014-09-19 LAB — GLUCOSE, CAPILLARY: Glucose-Capillary: 127 mg/dL — ABNORMAL HIGH (ref 65–99)

## 2014-09-22 DIAGNOSIS — D0461 Carcinoma in situ of skin of right upper limb, including shoulder: Secondary | ICD-10-CM | POA: Diagnosis not present

## 2014-09-23 ENCOUNTER — Encounter
Admission: RE | Admit: 2014-09-23 | Discharge: 2014-09-23 | Disposition: A | Discharge status: Home / Self Care | Payer: Medicare Other | Source: Ambulatory Visit | Attending: Internal Medicine | Admitting: Internal Medicine

## 2014-09-23 DIAGNOSIS — E119 Type 2 diabetes mellitus without complications: Secondary | ICD-10-CM | POA: Insufficient documentation

## 2014-09-26 DIAGNOSIS — E119 Type 2 diabetes mellitus without complications: Secondary | ICD-10-CM | POA: Diagnosis not present

## 2014-09-26 LAB — GLUCOSE, CAPILLARY: Glucose-Capillary: 177 mg/dL — ABNORMAL HIGH (ref 65–99)

## 2014-10-03 DIAGNOSIS — E119 Type 2 diabetes mellitus without complications: Secondary | ICD-10-CM | POA: Diagnosis not present

## 2014-10-04 LAB — GLUCOSE, CAPILLARY: Glucose-Capillary: 142 mg/dL — ABNORMAL HIGH (ref 65–99)

## 2014-10-08 DIAGNOSIS — Z23 Encounter for immunization: Secondary | ICD-10-CM | POA: Diagnosis not present

## 2014-10-10 DIAGNOSIS — E119 Type 2 diabetes mellitus without complications: Secondary | ICD-10-CM | POA: Diagnosis not present

## 2014-10-10 LAB — GLUCOSE, CAPILLARY: Glucose-Capillary: 175 mg/dL — ABNORMAL HIGH (ref 65–99)

## 2014-10-15 IMAGING — CR DG CHEST 1V
1 series · 2 of 2 positions shown · non-contrast
Comparison: none

REASON FOR EXAM: fall
COMMENTS:

[Series 16: t chest supine · 0.14mm/px · 2 of 2 slices shown]
[im 1/2]
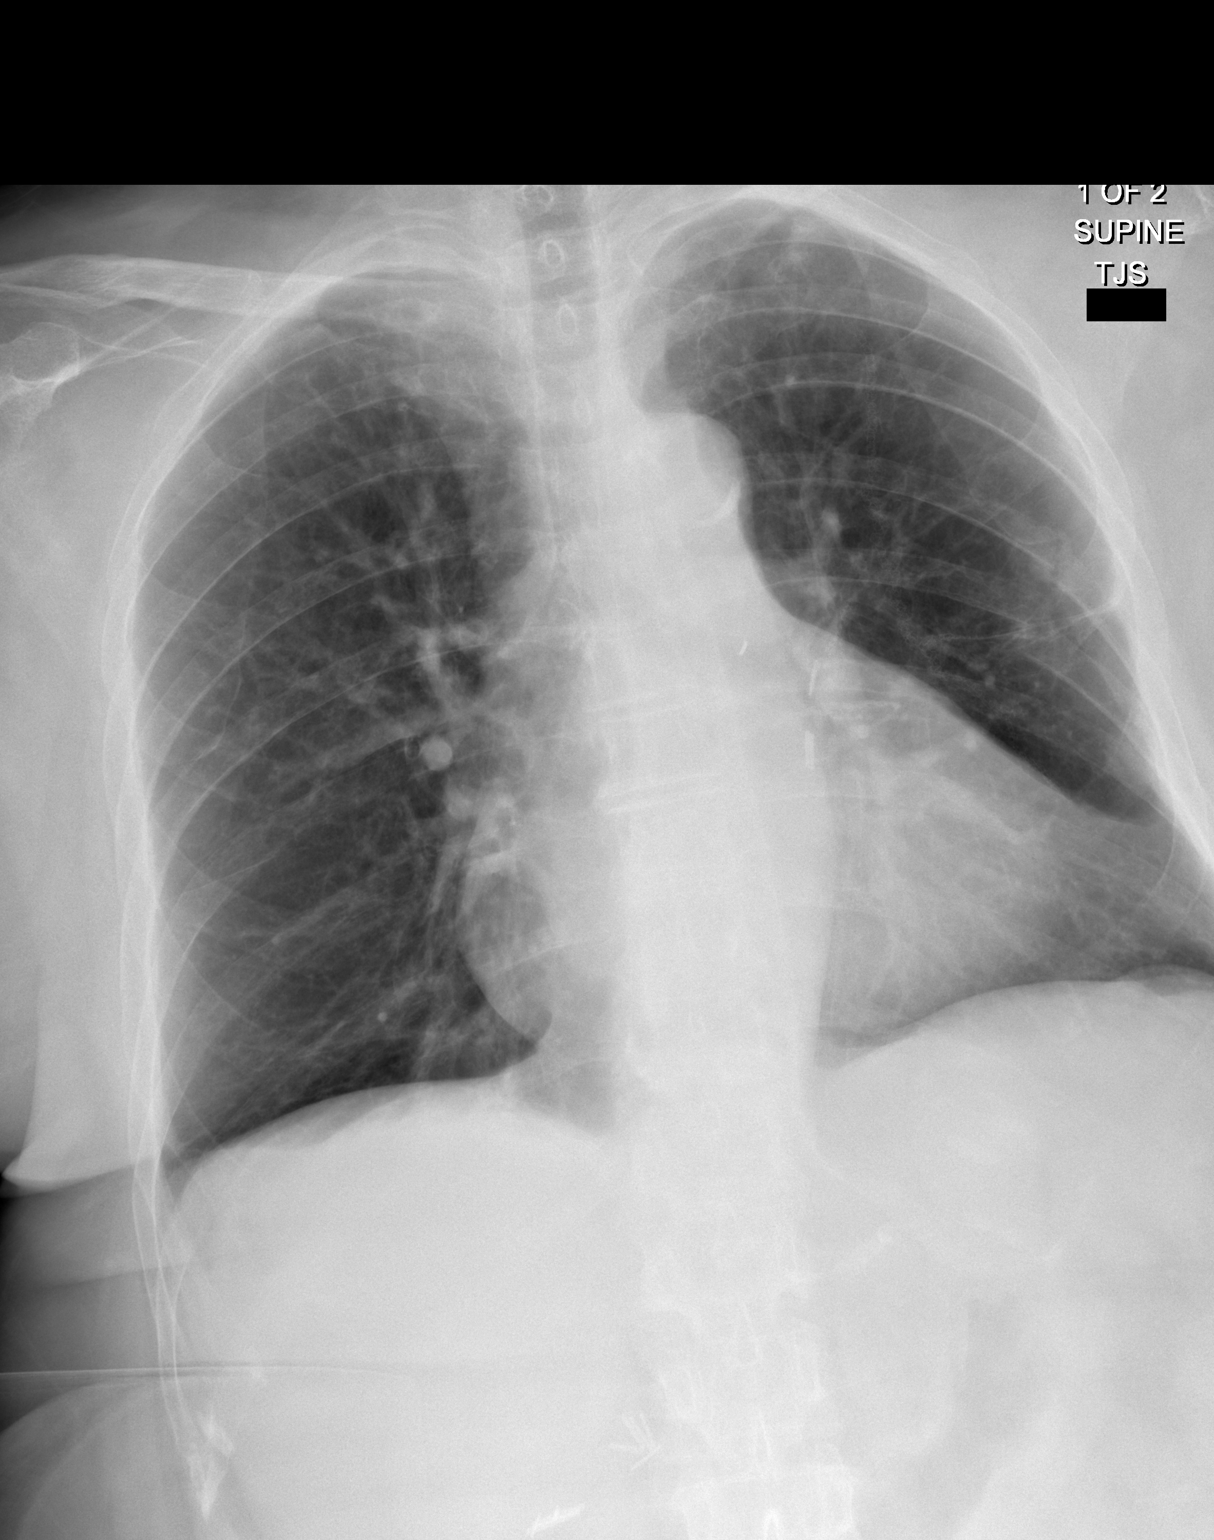
[im 2/2]
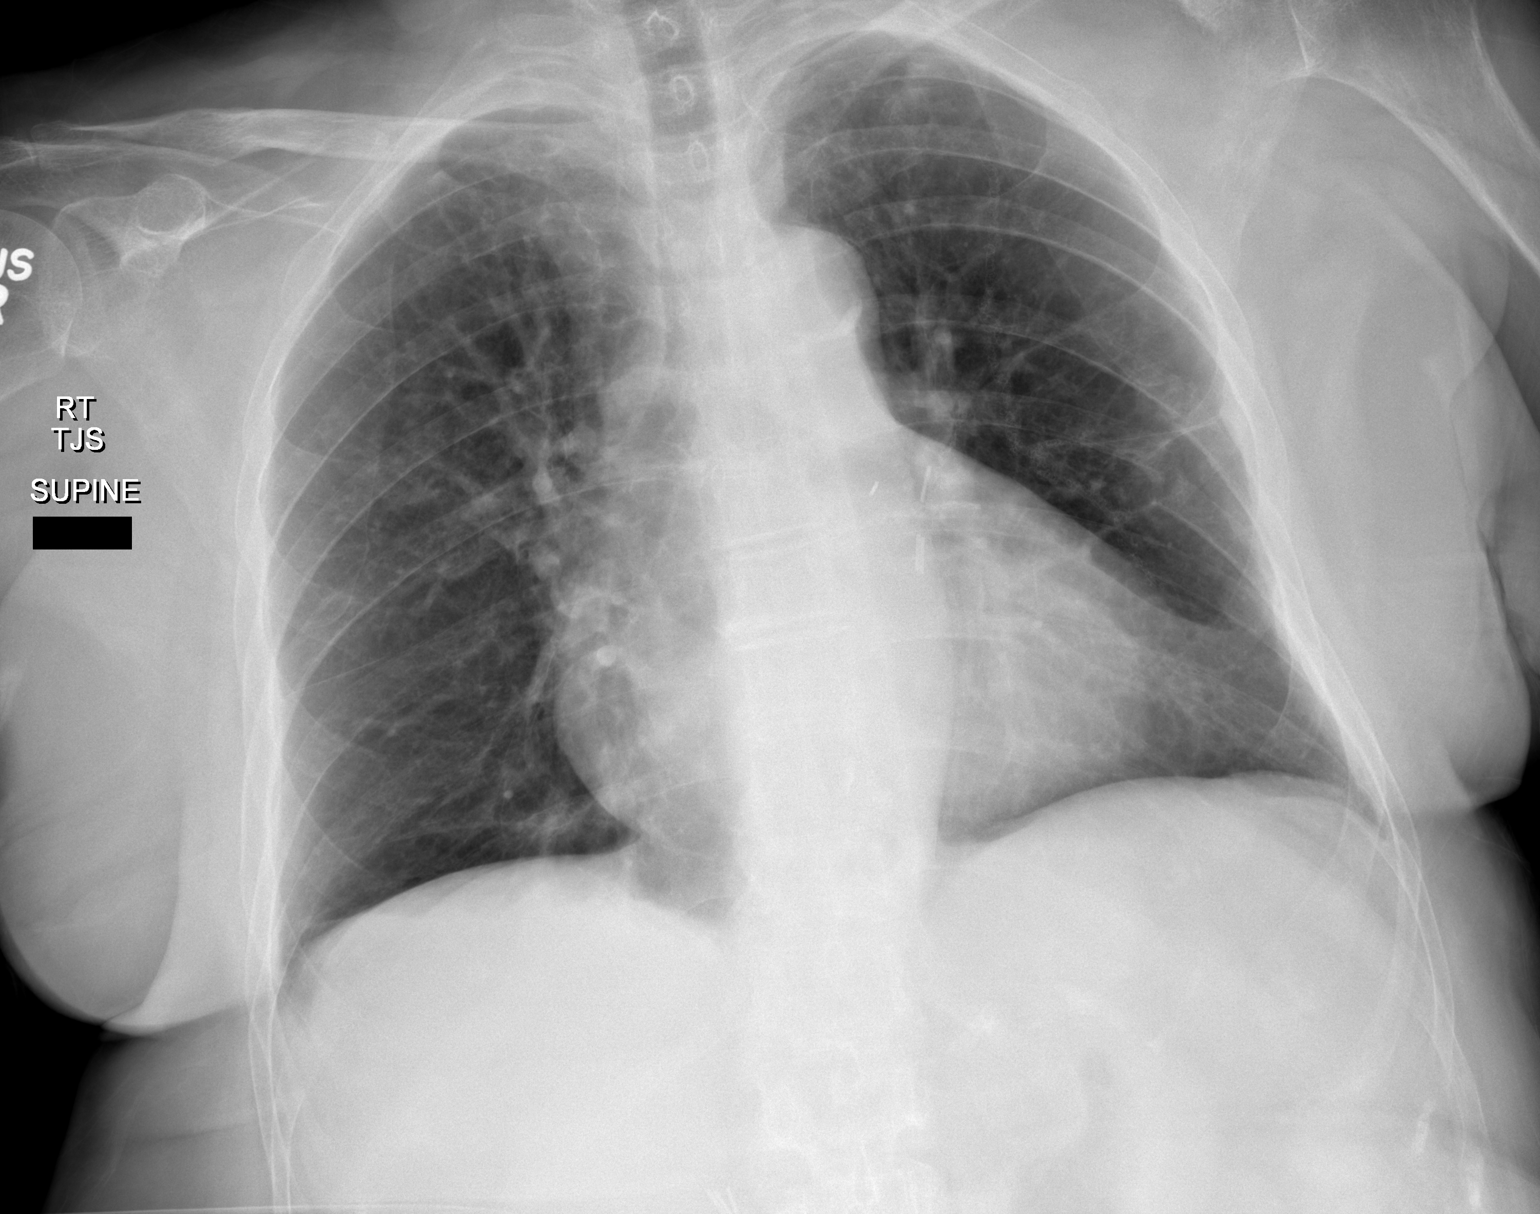

[2 of 2 positions shown; findings below may reference images not displayed]

PROCEDURE:     DXR - DXR CHEST 1 VIEWAP OR PA  - September 18, 2012  [DATE]

RESULT:     Comparison is made to study August 25, 2012.

The lungs are adequately inflated. Mild stable increased upper lobe
interstitial markings are present. The cardiac silhouette is mildly enlarged
though stable. The pulmonary vascularity is not clearly engorged. There is
tortuosity of the descending thoracic aorta. Surgical clips project in the
left hilar region. There is no pleural effusion. The observed portions of
the bony thorax exhibit no acute abnormalities. There are stable deformities
of posterior lateral aspects of the left sixth and seventh ribs.
IMPRESSION: 
IMPRESSION: 1. No acute thoracic abnormality is demonstrated. There is no evidence of a
pneumothorax
2. There are fractures of the posterior aspects of the left sixth and
seventh ribs which are not new.

[REDACTED]

## 2014-10-15 IMAGING — XA DG C-ARM 1-60 MIN
2 series · 2 of 2 positions shown · non-contrast
Comparison: none

REASON FOR EXAM: Fracture reduction/fixation
COMMENTS:

PROCEDURE:     DXR - DXR C-ARM WITH SPOT IMAGES  - September 18, 2012  [DATE]
RESULT:
Intramedullary rod and sliding lag screw fixation is identified and
partially visualized within the right hip.

[Series 2: cont. · 1 of 1 slices shown]
[im 1/1]
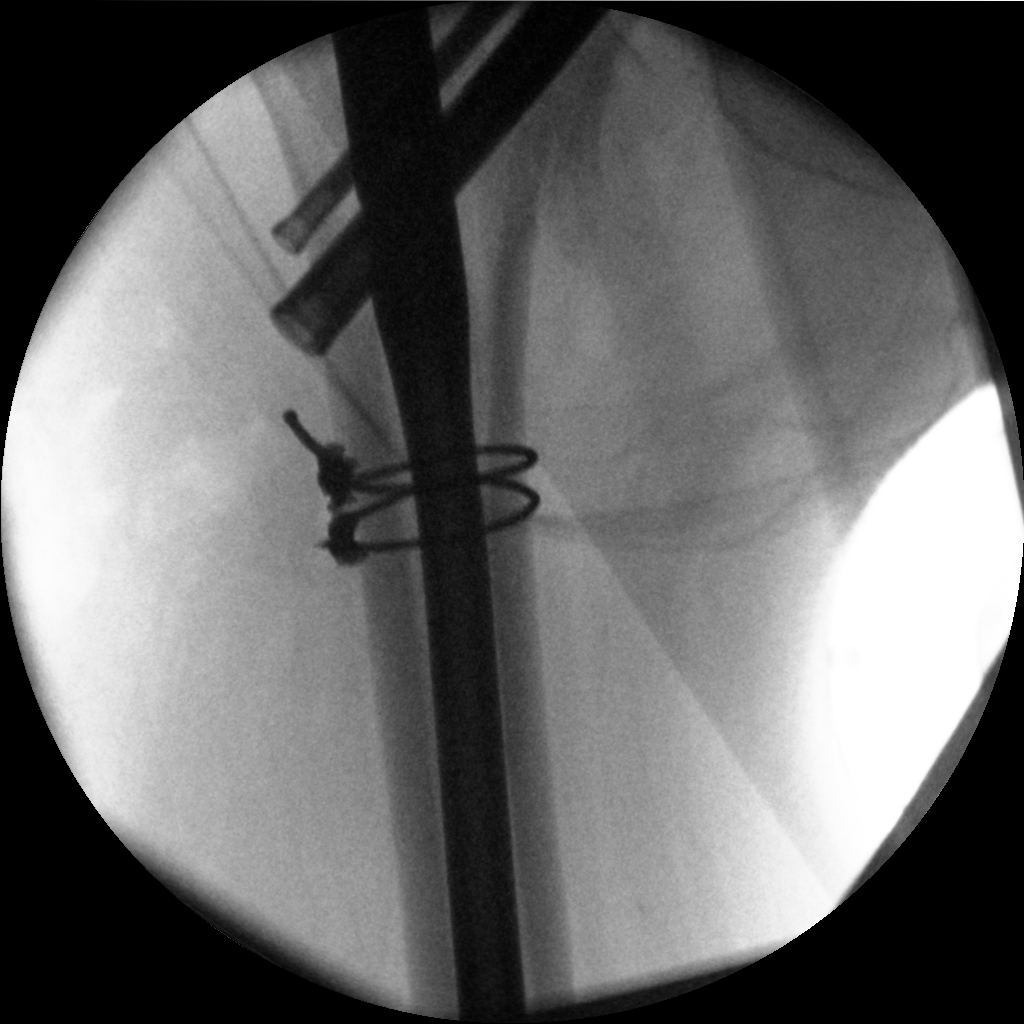

[Series 6001: m1 · 1 of 1 slices shown]
[im 1/1]
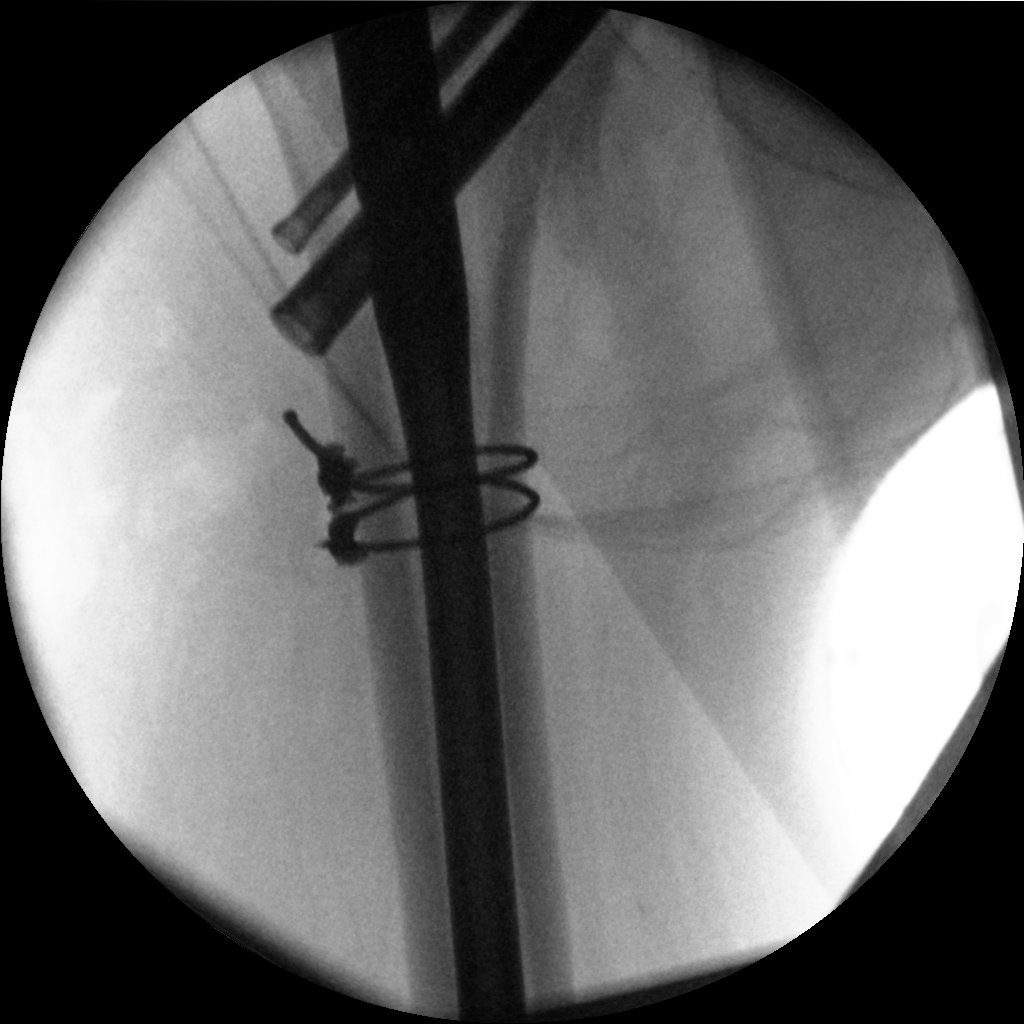

[2 of 2 positions shown; findings below may reference images not displayed]

IMPRESSION: Open reduction and internal fixation of right hip fracture.

The remainder of the interpretation will be left to the ordering physician.

## 2014-10-16 DIAGNOSIS — E119 Type 2 diabetes mellitus without complications: Secondary | ICD-10-CM | POA: Diagnosis not present

## 2014-10-16 LAB — URINALYSIS COMPLETE WITH MICROSCOPIC (ARMC ONLY)
BILIRUBIN URINE: NEGATIVE
Bacteria, UA: NONE SEEN
Glucose, UA: NEGATIVE mg/dL
HGB URINE DIPSTICK: NEGATIVE
KETONES UR: NEGATIVE mg/dL
LEUKOCYTES UA: NEGATIVE
Nitrite: NEGATIVE
Protein, ur: NEGATIVE mg/dL
Specific Gravity, Urine: 1.017 (ref 1.005–1.030)
Squamous Epithelial / LPF: NONE SEEN
pH: 5 (ref 5.0–8.0)

## 2014-10-17 DIAGNOSIS — E119 Type 2 diabetes mellitus without complications: Secondary | ICD-10-CM | POA: Diagnosis not present

## 2014-10-17 LAB — GLUCOSE, CAPILLARY: GLUCOSE-CAPILLARY: 138 mg/dL — AB (ref 65–99)

## 2014-10-18 LAB — URINE CULTURE

## 2014-10-23 ENCOUNTER — Encounter
Admission: RE | Admit: 2014-10-23 | Discharge: 2014-10-23 | Disposition: A | Discharge status: Home / Self Care | Payer: Medicare Other | Source: Ambulatory Visit | Attending: Internal Medicine | Admitting: Internal Medicine

## 2014-10-23 DIAGNOSIS — R05 Cough: Secondary | ICD-10-CM | POA: Insufficient documentation

## 2014-10-23 DIAGNOSIS — R39198 Other difficulties with micturition: Secondary | ICD-10-CM | POA: Insufficient documentation

## 2014-10-23 DIAGNOSIS — J449 Chronic obstructive pulmonary disease, unspecified: Secondary | ICD-10-CM | POA: Insufficient documentation

## 2014-10-24 DIAGNOSIS — J449 Chronic obstructive pulmonary disease, unspecified: Secondary | ICD-10-CM | POA: Diagnosis not present

## 2014-10-24 DIAGNOSIS — R05 Cough: Secondary | ICD-10-CM | POA: Diagnosis not present

## 2014-10-24 DIAGNOSIS — R39198 Other difficulties with micturition: Secondary | ICD-10-CM | POA: Diagnosis not present

## 2014-10-24 LAB — GLUCOSE, CAPILLARY: Glucose-Capillary: 145 mg/dL — ABNORMAL HIGH (ref 65–99)

## 2014-10-25 DIAGNOSIS — R05 Cough: Secondary | ICD-10-CM | POA: Diagnosis not present

## 2014-10-25 DIAGNOSIS — J449 Chronic obstructive pulmonary disease, unspecified: Secondary | ICD-10-CM | POA: Diagnosis not present

## 2014-10-25 DIAGNOSIS — R39198 Other difficulties with micturition: Secondary | ICD-10-CM | POA: Diagnosis not present

## 2014-10-25 LAB — URINALYSIS COMPLETE WITH MICROSCOPIC (ARMC ONLY)
BILIRUBIN URINE: NEGATIVE
Glucose, UA: NEGATIVE mg/dL
HGB URINE DIPSTICK: NEGATIVE
Ketones, ur: NEGATIVE mg/dL
NITRITE: NEGATIVE
PH: 5 (ref 5.0–8.0)
PROTEIN: NEGATIVE mg/dL
SPECIFIC GRAVITY, URINE: 1.018 (ref 1.005–1.030)

## 2014-10-26 LAB — URINE CULTURE

## 2014-10-29 DIAGNOSIS — R39198 Other difficulties with micturition: Secondary | ICD-10-CM | POA: Diagnosis not present

## 2014-10-29 DIAGNOSIS — J449 Chronic obstructive pulmonary disease, unspecified: Secondary | ICD-10-CM | POA: Diagnosis not present

## 2014-10-29 DIAGNOSIS — R05 Cough: Secondary | ICD-10-CM | POA: Diagnosis not present

## 2014-10-29 DIAGNOSIS — R0989 Other specified symptoms and signs involving the circulatory and respiratory systems: Secondary | ICD-10-CM | POA: Diagnosis not present

## 2014-10-29 LAB — CBC WITH DIFFERENTIAL/PLATELET
BASOS ABS: 0.1 10*3/uL (ref 0–0.1)
BASOS PCT: 1 %
EOS PCT: 4 %
Eosinophils Absolute: 0.3 10*3/uL (ref 0–0.7)
HCT: 43.9 % (ref 35.0–47.0)
Hemoglobin: 14.6 g/dL (ref 12.0–16.0)
Lymphocytes Relative: 34 %
Lymphs Abs: 2.8 10*3/uL (ref 1.0–3.6)
MCH: 30.1 pg (ref 26.0–34.0)
MCHC: 33.3 g/dL (ref 32.0–36.0)
MCV: 90.5 fL (ref 80.0–100.0)
MONO ABS: 0.8 10*3/uL (ref 0.2–0.9)
Monocytes Relative: 9 %
Neutro Abs: 4.4 10*3/uL (ref 1.4–6.5)
Neutrophils Relative %: 52 %
PLATELETS: 227 10*3/uL (ref 150–440)
RBC: 4.86 MIL/uL (ref 3.80–5.20)
RDW: 13 % (ref 11.5–14.5)
WBC: 8.3 10*3/uL (ref 3.6–11.0)

## 2014-10-29 LAB — URINALYSIS COMPLETE WITH MICROSCOPIC (ARMC ONLY)
Bacteria, UA: NONE SEEN
Bilirubin Urine: NEGATIVE
Glucose, UA: >500 mg/dL — AB
Hgb urine dipstick: NEGATIVE
Ketones, ur: NEGATIVE mg/dL
LEUKOCYTES UA: NEGATIVE
NITRITE: NEGATIVE
PH: 5 (ref 5.0–8.0)
PROTEIN: NEGATIVE mg/dL
Specific Gravity, Urine: 1.014 (ref 1.005–1.030)

## 2014-10-29 LAB — COMPREHENSIVE METABOLIC PANEL
ALBUMIN: 3.8 g/dL (ref 3.5–5.0)
ALT: 15 U/L (ref 14–54)
ANION GAP: 10 (ref 5–15)
AST: 16 U/L (ref 15–41)
Alkaline Phosphatase: 57 U/L (ref 38–126)
BUN: 27 mg/dL — AB (ref 6–20)
CHLORIDE: 105 mmol/L (ref 101–111)
CO2: 25 mmol/L (ref 22–32)
Calcium: 9.2 mg/dL (ref 8.9–10.3)
Creatinine, Ser: 1.06 mg/dL — ABNORMAL HIGH (ref 0.44–1.00)
GFR calc Af Amer: 57 mL/min — ABNORMAL LOW (ref >60)
GFR calc non Af Amer: 49 mL/min — ABNORMAL LOW (ref >60)
GLUCOSE: 203 mg/dL — AB (ref 65–99)
POTASSIUM: 4.4 mmol/L (ref 3.5–5.1)
Sodium: 140 mmol/L (ref 135–145)
Total Bilirubin: 0.2 mg/dL — ABNORMAL LOW (ref 0.3–1.2)
Total Protein: 6.3 g/dL — ABNORMAL LOW (ref 6.5–8.1)

## 2014-10-29 LAB — TSH: TSH: 1.195 u[IU]/mL (ref 0.350–4.500)

## 2014-10-31 DIAGNOSIS — R05 Cough: Secondary | ICD-10-CM | POA: Diagnosis not present

## 2014-10-31 DIAGNOSIS — J449 Chronic obstructive pulmonary disease, unspecified: Secondary | ICD-10-CM | POA: Diagnosis not present

## 2014-10-31 DIAGNOSIS — R39198 Other difficulties with micturition: Secondary | ICD-10-CM | POA: Diagnosis not present

## 2014-10-31 LAB — URINE CULTURE

## 2014-10-31 LAB — GLUCOSE, CAPILLARY: GLUCOSE-CAPILLARY: 208 mg/dL — AB (ref 65–99)

## 2014-11-07 DIAGNOSIS — R39198 Other difficulties with micturition: Secondary | ICD-10-CM | POA: Diagnosis not present

## 2014-11-07 DIAGNOSIS — J449 Chronic obstructive pulmonary disease, unspecified: Secondary | ICD-10-CM | POA: Diagnosis not present

## 2014-11-07 DIAGNOSIS — R05 Cough: Secondary | ICD-10-CM | POA: Diagnosis not present

## 2014-11-07 LAB — GLUCOSE, CAPILLARY: Glucose-Capillary: 135 mg/dL — ABNORMAL HIGH (ref 65–99)

## 2014-11-12 DIAGNOSIS — E119 Type 2 diabetes mellitus without complications: Secondary | ICD-10-CM | POA: Diagnosis not present

## 2014-11-12 DIAGNOSIS — N183 Chronic kidney disease, stage 3 (moderate): Secondary | ICD-10-CM | POA: Diagnosis not present

## 2014-11-12 DIAGNOSIS — J449 Chronic obstructive pulmonary disease, unspecified: Secondary | ICD-10-CM | POA: Diagnosis not present

## 2014-11-14 DIAGNOSIS — J449 Chronic obstructive pulmonary disease, unspecified: Secondary | ICD-10-CM | POA: Diagnosis not present

## 2014-11-14 DIAGNOSIS — R39198 Other difficulties with micturition: Secondary | ICD-10-CM | POA: Diagnosis not present

## 2014-11-14 DIAGNOSIS — R05 Cough: Secondary | ICD-10-CM | POA: Diagnosis not present

## 2014-11-14 LAB — GLUCOSE, CAPILLARY: Glucose-Capillary: 126 mg/dL — ABNORMAL HIGH (ref 65–99)

## 2014-11-19 DIAGNOSIS — D0461 Carcinoma in situ of skin of right upper limb, including shoulder: Secondary | ICD-10-CM | POA: Diagnosis not present

## 2014-11-21 DIAGNOSIS — R05 Cough: Secondary | ICD-10-CM | POA: Diagnosis not present

## 2014-11-21 DIAGNOSIS — R39198 Other difficulties with micturition: Secondary | ICD-10-CM | POA: Diagnosis not present

## 2014-11-21 DIAGNOSIS — J449 Chronic obstructive pulmonary disease, unspecified: Secondary | ICD-10-CM | POA: Diagnosis not present

## 2014-11-21 LAB — GLUCOSE, CAPILLARY: GLUCOSE-CAPILLARY: 156 mg/dL — AB (ref 65–99)

## 2014-11-23 ENCOUNTER — Encounter
Admission: RE | Admit: 2014-11-23 | Discharge: 2014-11-23 | Disposition: A | Discharge status: Home / Self Care | Payer: Medicare Other | Source: Ambulatory Visit | Attending: Internal Medicine | Admitting: Internal Medicine

## 2014-11-23 DIAGNOSIS — E119 Type 2 diabetes mellitus without complications: Secondary | ICD-10-CM | POA: Insufficient documentation

## 2014-11-28 DIAGNOSIS — E119 Type 2 diabetes mellitus without complications: Secondary | ICD-10-CM | POA: Diagnosis not present

## 2014-11-28 LAB — GLUCOSE, CAPILLARY: Glucose-Capillary: 163 mg/dL — ABNORMAL HIGH (ref 65–99)

## 2014-12-05 DIAGNOSIS — E119 Type 2 diabetes mellitus without complications: Secondary | ICD-10-CM | POA: Diagnosis not present

## 2014-12-05 LAB — GLUCOSE, CAPILLARY: Glucose-Capillary: 251 mg/dL — ABNORMAL HIGH (ref 65–99)

## 2014-12-15 ENCOUNTER — Telehealth: Payer: Self-pay

## 2014-12-15 NOTE — Telephone Encounter (Signed)
Informed by daughter Lattie Haw, the patient has been living at [facility] for 2 years and is seen by Dr. Ouida Sills for any medical attention required.   Lattie Haw will have most recent dates faxed from the facility of completed A1C and Urine Microalbumin.

## 2014-12-19 DIAGNOSIS — E119 Type 2 diabetes mellitus without complications: Secondary | ICD-10-CM | POA: Diagnosis not present

## 2014-12-19 LAB — GLUCOSE, CAPILLARY: Glucose-Capillary: 192 mg/dL — ABNORMAL HIGH (ref 65–99)

## 2014-12-23 ENCOUNTER — Encounter
Admission: RE | Admit: 2014-12-23 | Discharge: 2014-12-23 | Disposition: A | Discharge status: Home / Self Care | Payer: Medicare Other | Source: Ambulatory Visit | Attending: Internal Medicine | Admitting: Internal Medicine

## 2014-12-23 DIAGNOSIS — Z79899 Other long term (current) drug therapy: Secondary | ICD-10-CM | POA: Insufficient documentation

## 2014-12-23 DIAGNOSIS — K219 Gastro-esophageal reflux disease without esophagitis: Secondary | ICD-10-CM | POA: Insufficient documentation

## 2014-12-23 DIAGNOSIS — E119 Type 2 diabetes mellitus without complications: Secondary | ICD-10-CM | POA: Insufficient documentation

## 2015-01-11 DIAGNOSIS — K219 Gastro-esophageal reflux disease without esophagitis: Secondary | ICD-10-CM | POA: Diagnosis not present

## 2015-01-11 DIAGNOSIS — E119 Type 2 diabetes mellitus without complications: Secondary | ICD-10-CM | POA: Diagnosis not present

## 2015-01-11 DIAGNOSIS — Z79899 Other long term (current) drug therapy: Secondary | ICD-10-CM | POA: Diagnosis not present

## 2015-01-11 LAB — VITAMIN B12: Vitamin B-12: 817 pg/mL (ref 180–914)

## 2015-01-11 LAB — MAGNESIUM: MAGNESIUM: 1.7 mg/dL (ref 1.7–2.4)

## 2015-01-12 LAB — VITAMIN D 25 HYDROXY (VIT D DEFICIENCY, FRACTURES): VIT D 25 HYDROXY: 34.1 ng/mL (ref 30.0–100.0)

## 2015-01-16 DIAGNOSIS — E119 Type 2 diabetes mellitus without complications: Secondary | ICD-10-CM | POA: Diagnosis not present

## 2015-01-16 DIAGNOSIS — Z79899 Other long term (current) drug therapy: Secondary | ICD-10-CM | POA: Diagnosis not present

## 2015-01-16 DIAGNOSIS — K219 Gastro-esophageal reflux disease without esophagitis: Secondary | ICD-10-CM | POA: Diagnosis not present

## 2015-01-16 LAB — GLUCOSE, CAPILLARY: Glucose-Capillary: 181 mg/dL — ABNORMAL HIGH (ref 65–99)

## 2015-01-23 ENCOUNTER — Encounter
Admission: RE | Admit: 2015-01-23 | Discharge: 2015-01-23 | Disposition: A | Discharge status: Home / Self Care | Payer: Medicare Other | Source: Ambulatory Visit | Attending: Internal Medicine | Admitting: Internal Medicine

## 2015-01-23 DIAGNOSIS — R39198 Other difficulties with micturition: Secondary | ICD-10-CM | POA: Insufficient documentation

## 2015-01-23 DIAGNOSIS — E119 Type 2 diabetes mellitus without complications: Secondary | ICD-10-CM | POA: Insufficient documentation

## 2015-01-27 DIAGNOSIS — E119 Type 2 diabetes mellitus without complications: Secondary | ICD-10-CM | POA: Diagnosis not present

## 2015-01-27 DIAGNOSIS — N184 Chronic kidney disease, stage 4 (severe): Secondary | ICD-10-CM | POA: Diagnosis not present

## 2015-01-27 DIAGNOSIS — I10 Essential (primary) hypertension: Secondary | ICD-10-CM | POA: Diagnosis not present

## 2015-01-27 DIAGNOSIS — J449 Chronic obstructive pulmonary disease, unspecified: Secondary | ICD-10-CM | POA: Diagnosis not present

## 2015-01-28 DIAGNOSIS — E119 Type 2 diabetes mellitus without complications: Secondary | ICD-10-CM | POA: Diagnosis not present

## 2015-01-28 DIAGNOSIS — R39198 Other difficulties with micturition: Secondary | ICD-10-CM | POA: Diagnosis not present

## 2015-01-28 LAB — RAPID INFLUENZA A&B ANTIGENS (ARMC ONLY)
INFLUENZA A (ARMC): NOT DETECTED
INFLUENZA B (ARMC): NOT DETECTED

## 2015-01-30 DIAGNOSIS — R39198 Other difficulties with micturition: Secondary | ICD-10-CM | POA: Diagnosis not present

## 2015-01-30 DIAGNOSIS — E119 Type 2 diabetes mellitus without complications: Secondary | ICD-10-CM | POA: Diagnosis not present

## 2015-01-30 LAB — GLUCOSE, CAPILLARY: GLUCOSE-CAPILLARY: 362 mg/dL — AB (ref 65–99)

## 2015-02-04 DIAGNOSIS — E119 Type 2 diabetes mellitus without complications: Secondary | ICD-10-CM | POA: Diagnosis not present

## 2015-02-04 DIAGNOSIS — R39198 Other difficulties with micturition: Secondary | ICD-10-CM | POA: Diagnosis not present

## 2015-02-04 LAB — GLUCOSE, CAPILLARY
GLUCOSE-CAPILLARY: 290 mg/dL — AB (ref 65–99)
GLUCOSE-CAPILLARY: 140 mg/dL — AB (ref 65–99)
Glucose-Capillary: 472 mg/dL — ABNORMAL HIGH (ref 65–99)

## 2015-02-05 ENCOUNTER — Inpatient Hospital Stay
Admission: EM | Admit: 2015-02-05 | Discharge: 2015-02-07 | DRG: 189 | Disposition: A | Discharge status: Skilled Nursing Facility | Payer: Medicare Other | Attending: Internal Medicine | Admitting: Internal Medicine

## 2015-02-05 ENCOUNTER — Inpatient Hospital Stay: Payer: Medicare Other

## 2015-02-05 ENCOUNTER — Emergency Department: Payer: Medicare Other

## 2015-02-05 ENCOUNTER — Encounter: Payer: Self-pay | Admitting: Emergency Medicine

## 2015-02-05 DIAGNOSIS — J9601 Acute respiratory failure with hypoxia: Secondary | ICD-10-CM | POA: Diagnosis present

## 2015-02-05 DIAGNOSIS — D72829 Elevated white blood cell count, unspecified: Secondary | ICD-10-CM | POA: Diagnosis not present

## 2015-02-05 DIAGNOSIS — R4182 Altered mental status, unspecified: Secondary | ICD-10-CM

## 2015-02-05 DIAGNOSIS — E119 Type 2 diabetes mellitus without complications: Secondary | ICD-10-CM | POA: Diagnosis present

## 2015-02-05 DIAGNOSIS — F0390 Unspecified dementia without behavioral disturbance: Secondary | ICD-10-CM | POA: Diagnosis present

## 2015-02-05 DIAGNOSIS — E785 Hyperlipidemia, unspecified: Secondary | ICD-10-CM | POA: Diagnosis present

## 2015-02-05 DIAGNOSIS — Z7984 Long term (current) use of oral hypoglycemic drugs: Secondary | ICD-10-CM | POA: Diagnosis not present

## 2015-02-05 DIAGNOSIS — Z888 Allergy status to other drugs, medicaments and biological substances status: Secondary | ICD-10-CM | POA: Diagnosis not present

## 2015-02-05 DIAGNOSIS — G9340 Encephalopathy, unspecified: Secondary | ICD-10-CM | POA: Diagnosis not present

## 2015-02-05 DIAGNOSIS — Z7951 Long term (current) use of inhaled steroids: Secondary | ICD-10-CM | POA: Diagnosis not present

## 2015-02-05 DIAGNOSIS — Z79899 Other long term (current) drug therapy: Secondary | ICD-10-CM | POA: Diagnosis not present

## 2015-02-05 DIAGNOSIS — Z87891 Personal history of nicotine dependence: Secondary | ICD-10-CM

## 2015-02-05 DIAGNOSIS — K219 Gastro-esophageal reflux disease without esophagitis: Secondary | ICD-10-CM | POA: Diagnosis not present

## 2015-02-05 DIAGNOSIS — R05 Cough: Secondary | ICD-10-CM | POA: Diagnosis not present

## 2015-02-05 DIAGNOSIS — M25561 Pain in right knee: Secondary | ICD-10-CM | POA: Diagnosis not present

## 2015-02-05 DIAGNOSIS — Z794 Long term (current) use of insulin: Secondary | ICD-10-CM

## 2015-02-05 DIAGNOSIS — N170 Acute kidney failure with tubular necrosis: Secondary | ICD-10-CM | POA: Diagnosis present

## 2015-02-05 DIAGNOSIS — R39198 Other difficulties with micturition: Secondary | ICD-10-CM | POA: Diagnosis present

## 2015-02-05 DIAGNOSIS — Z9981 Dependence on supplemental oxygen: Secondary | ICD-10-CM | POA: Diagnosis not present

## 2015-02-05 DIAGNOSIS — R410 Disorientation, unspecified: Secondary | ICD-10-CM | POA: Diagnosis not present

## 2015-02-05 DIAGNOSIS — M79604 Pain in right leg: Secondary | ICD-10-CM | POA: Diagnosis not present

## 2015-02-05 DIAGNOSIS — R52 Pain, unspecified: Secondary | ICD-10-CM

## 2015-02-05 DIAGNOSIS — R0902 Hypoxemia: Secondary | ICD-10-CM | POA: Diagnosis not present

## 2015-02-05 DIAGNOSIS — T380X5A Adverse effect of glucocorticoids and synthetic analogues, initial encounter: Secondary | ICD-10-CM | POA: Diagnosis present

## 2015-02-05 DIAGNOSIS — I10 Essential (primary) hypertension: Secondary | ICD-10-CM | POA: Diagnosis not present

## 2015-02-05 DIAGNOSIS — R059 Cough, unspecified: Secondary | ICD-10-CM

## 2015-02-05 DIAGNOSIS — M179 Osteoarthritis of knee, unspecified: Secondary | ICD-10-CM | POA: Diagnosis not present

## 2015-02-05 DIAGNOSIS — E860 Dehydration: Secondary | ICD-10-CM | POA: Diagnosis present

## 2015-02-05 DIAGNOSIS — M79661 Pain in right lower leg: Secondary | ICD-10-CM | POA: Diagnosis not present

## 2015-02-05 DIAGNOSIS — R0602 Shortness of breath: Secondary | ICD-10-CM | POA: Diagnosis not present

## 2015-02-05 HISTORY — DX: Type 2 diabetes mellitus without complications: E11.9

## 2015-02-05 HISTORY — DX: Anemia, unspecified: D64.9

## 2015-02-05 HISTORY — DX: Unspecified dementia, unspecified severity, without behavioral disturbance, psychotic disturbance, mood disturbance, and anxiety: F03.90

## 2015-02-05 HISTORY — DX: Hyperlipidemia, unspecified: E78.5

## 2015-02-05 LAB — TROPONIN I

## 2015-02-05 LAB — CBC WITH DIFFERENTIAL/PLATELET
Basophils Absolute: 0.2 10*3/uL — ABNORMAL HIGH (ref 0–0.1)
Basophils Relative: 1 %
Eosinophils Absolute: 0.4 10*3/uL (ref 0–0.7)
Eosinophils Relative: 3 %
HEMATOCRIT: 47.1 % — AB (ref 35.0–47.0)
Hemoglobin: 15.2 g/dL (ref 12.0–16.0)
LYMPHS ABS: 3.6 10*3/uL (ref 1.0–3.6)
LYMPHS PCT: 24 %
MCH: 28.9 pg (ref 26.0–34.0)
MCHC: 32.2 g/dL (ref 32.0–36.0)
MCV: 89.9 fL (ref 80.0–100.0)
MONO ABS: 1.2 10*3/uL — AB (ref 0.2–0.9)
MONOS PCT: 8 %
NEUTROS ABS: 9.9 10*3/uL — AB (ref 1.4–6.5)
Neutrophils Relative %: 64 %
Platelets: 305 10*3/uL (ref 150–440)
RBC: 5.24 MIL/uL — ABNORMAL HIGH (ref 3.80–5.20)
RDW: 13.4 % (ref 11.5–14.5)
WBC: 15.3 10*3/uL — ABNORMAL HIGH (ref 3.6–11.0)

## 2015-02-05 LAB — URINALYSIS COMPLETE WITH MICROSCOPIC (ARMC ONLY)
BACTERIA UA: NONE SEEN
Bilirubin Urine: NEGATIVE
GLUCOSE, UA: 50 mg/dL — AB
Hgb urine dipstick: NEGATIVE
Ketones, ur: NEGATIVE mg/dL
Leukocytes, UA: NEGATIVE
NITRITE: NEGATIVE
PROTEIN: NEGATIVE mg/dL
Specific Gravity, Urine: 1.02 (ref 1.005–1.030)
pH: 5 (ref 5.0–8.0)

## 2015-02-05 LAB — BASIC METABOLIC PANEL
Anion gap: 11 (ref 5–15)
BUN: 34 mg/dL — ABNORMAL HIGH (ref 6–20)
CO2: 26 mmol/L (ref 22–32)
CREATININE: 1.13 mg/dL — AB (ref 0.44–1.00)
Calcium: 9.5 mg/dL (ref 8.9–10.3)
Chloride: 101 mmol/L (ref 101–111)
GFR calc Af Amer: 52 mL/min — ABNORMAL LOW (ref >60)
GFR calc non Af Amer: 45 mL/min — ABNORMAL LOW (ref >60)
GLUCOSE: 239 mg/dL — AB (ref 65–99)
Potassium: 4.8 mmol/L (ref 3.5–5.1)
Sodium: 138 mmol/L (ref 135–145)

## 2015-02-05 LAB — GLUCOSE, CAPILLARY
Glucose-Capillary: 187 mg/dL — ABNORMAL HIGH (ref 65–99)
Glucose-Capillary: 161 mg/dL — ABNORMAL HIGH (ref 65–99)
Glucose-Capillary: 254 mg/dL — ABNORMAL HIGH (ref 65–99)

## 2015-02-05 LAB — BRAIN NATRIURETIC PEPTIDE: B NATRIURETIC PEPTIDE 5: 44 pg/mL (ref 0.0–100.0)

## 2015-02-05 MED ORDER — TRAZODONE HCL 50 MG PO TABS
25.0000 mg | ORAL_TABLET | Freq: Every evening | ORAL | Status: DC | PRN
  Administered 2015-02-06: 25 mg via ORAL
  Filled 2015-02-05 (×2): qty 1

## 2015-02-05 MED ORDER — ACETAMINOPHEN 650 MG RE SUPP: 650.0000 mg | Freq: Four times a day (QID) | RECTAL | Status: DC | PRN

## 2015-02-05 MED ORDER — GLIMEPIRIDE 2 MG PO TABS
3.0000 mg | ORAL_TABLET | Freq: Two times a day (BID) | ORAL | Status: DC
  Filled 2015-02-05: qty 1

## 2015-02-05 MED ORDER — DOCUSATE SODIUM 100 MG PO CAPS
100.0000 mg | ORAL_CAPSULE | Freq: Two times a day (BID) | ORAL | Status: DC
  Administered 2015-02-05 – 2015-02-07 (×4): 100 mg via ORAL
  Filled 2015-02-05 (×4): qty 1

## 2015-02-05 MED ORDER — BISACODYL 5 MG PO TBEC: 5.0000 mg | DELAYED_RELEASE_TABLET | Freq: Every day | ORAL | Status: DC | PRN

## 2015-02-05 MED ORDER — ACETAMINOPHEN 500 MG PO TABS
ORAL_TABLET | ORAL | Status: AC
  Administered 2015-02-05: 1000 mg via ORAL
  Filled 2015-02-05: qty 2

## 2015-02-05 MED ORDER — MOMETASONE FURO-FORMOTEROL FUM 100-5 MCG/ACT IN AERO
2.0000 | INHALATION_SPRAY | Freq: Two times a day (BID) | RESPIRATORY_TRACT | Status: DC
  Administered 2015-02-05 – 2015-02-06 (×2): 2 via RESPIRATORY_TRACT
  Filled 2015-02-05: qty 8.8

## 2015-02-05 MED ORDER — TRAMADOL HCL 50 MG PO TABS: 50.0000 mg | ORAL_TABLET | Freq: Four times a day (QID) | ORAL | Status: DC | PRN

## 2015-02-05 MED ORDER — LOSARTAN POTASSIUM 50 MG PO TABS: 50.0000 mg | ORAL_TABLET | Freq: Every day | ORAL | Status: DC

## 2015-02-05 MED ORDER — PRAVASTATIN SODIUM 10 MG PO TABS
10.0000 mg | ORAL_TABLET | Freq: Every day | ORAL | Status: DC
  Filled 2015-02-05: qty 1

## 2015-02-05 MED ORDER — VITAMIN D 1000 UNITS PO TABS
1000.0000 [IU] | ORAL_TABLET | Freq: Every day | ORAL | Status: DC
  Administered 2015-02-06 – 2015-02-07 (×2): 1000 [IU] via ORAL
  Filled 2015-02-05 (×4): qty 1

## 2015-02-05 MED ORDER — HEPARIN SODIUM (PORCINE) 5000 UNIT/ML IJ SOLN: 5000.0000 [IU] | Freq: Three times a day (TID) | INTRAMUSCULAR | Status: DC

## 2015-02-05 MED ORDER — MAGNESIUM HYDROXIDE 400 MG/5ML PO SUSP: 30.0000 mL | Freq: Every day | ORAL | Status: DC | PRN

## 2015-02-05 MED ORDER — LOSARTAN POTASSIUM 50 MG PO TABS
50.0000 mg | ORAL_TABLET | Freq: Every day | ORAL | Status: DC
  Administered 2015-02-06 – 2015-02-07 (×2): 50 mg via ORAL
  Filled 2015-02-05 (×2): qty 1

## 2015-02-05 MED ORDER — HYDROCODONE-ACETAMINOPHEN 5-325 MG PO TABS: 1.0000 | ORAL_TABLET | ORAL | Status: DC | PRN

## 2015-02-05 MED ORDER — METOPROLOL SUCCINATE ER 25 MG PO TB24
25.0000 mg | ORAL_TABLET | Freq: Every day | ORAL | Status: DC
  Administered 2015-02-06 – 2015-02-07 (×2): 25 mg via ORAL
  Filled 2015-02-05 (×2): qty 1

## 2015-02-05 MED ORDER — FOLIC ACID 1 MG PO TABS
1000.0000 ug | ORAL_TABLET | Freq: Every day | ORAL | Status: DC
  Administered 2015-02-06 – 2015-02-07 (×2): 1 mg via ORAL
  Filled 2015-02-05 (×2): qty 1

## 2015-02-05 MED ORDER — BISACODYL 10 MG RE SUPP: 10.0000 mg | Freq: Every day | RECTAL | Status: DC | PRN

## 2015-02-05 MED ORDER — ONDANSETRON HCL 4 MG PO TABS: 4.0000 mg | ORAL_TABLET | Freq: Four times a day (QID) | ORAL | Status: DC | PRN

## 2015-02-05 MED ORDER — PANTOPRAZOLE SODIUM 40 MG PO TBEC
40.0000 mg | DELAYED_RELEASE_TABLET | Freq: Every day | ORAL | Status: DC
  Administered 2015-02-06 – 2015-02-07 (×2): 40 mg via ORAL
  Filled 2015-02-05 (×2): qty 1

## 2015-02-05 MED ORDER — GUAIFENESIN 100 MG/5ML PO SYRP
200.0000 mg | ORAL_SOLUTION | ORAL | Status: DC | PRN
  Filled 2015-02-05: qty 10

## 2015-02-05 MED ORDER — ACETAMINOPHEN 500 MG PO TABS
1000.0000 mg | ORAL_TABLET | Freq: Once | ORAL | Status: AC
  Administered 2015-02-05: 1000 mg via ORAL

## 2015-02-05 MED ORDER — ALUM & MAG HYDROXIDE-SIMETH 200-200-20 MG/5ML PO SUSP: 30.0000 mL | Freq: Four times a day (QID) | ORAL | Status: DC | PRN

## 2015-02-05 MED ORDER — ACETAMINOPHEN 325 MG PO TABS: 650.0000 mg | ORAL_TABLET | Freq: Four times a day (QID) | ORAL | Status: DC | PRN

## 2015-02-05 MED ORDER — METOPROLOL SUCCINATE ER 25 MG PO TB24: 25.0000 mg | ORAL_TABLET | Freq: Every day | ORAL | Status: DC

## 2015-02-05 MED ORDER — SENNA 8.6 MG PO TABS
8.6000 mg | ORAL_TABLET | Freq: Two times a day (BID) | ORAL | Status: DC
  Administered 2015-02-05 – 2015-02-07 (×4): 8.6 mg via ORAL
  Filled 2015-02-05 (×4): qty 1

## 2015-02-05 MED ORDER — DOCUSATE SODIUM 100 MG PO CAPS: 100.0000 mg | ORAL_CAPSULE | Freq: Two times a day (BID) | ORAL | Status: DC | PRN

## 2015-02-05 MED ORDER — ACETAMINOPHEN 325 MG PO TABS: 650.0000 mg | ORAL_TABLET | ORAL | Status: DC | PRN

## 2015-02-05 MED ORDER — HYDROCODONE-ACETAMINOPHEN 5-325 MG PO TABS
1.0000 | ORAL_TABLET | ORAL | Status: DC | PRN
Start: 2015-02-05 — End: 2015-02-07
  Administered 2015-02-05 – 2015-02-06 (×3): 1 via ORAL
  Filled 2015-02-05 (×3): qty 1

## 2015-02-05 MED ORDER — CYANOCOBALAMIN 500 MCG PO TABS
250.0000 ug | ORAL_TABLET | Freq: Every day | ORAL | Status: DC
  Administered 2015-02-06 – 2015-02-07 (×2): 250 ug via ORAL
  Filled 2015-02-05 (×2): qty 1

## 2015-02-05 MED ORDER — CALCIUM CARBONATE-VITAMIN D 500-200 MG-UNIT PO TABS
1.0000 | ORAL_TABLET | Freq: Two times a day (BID) | ORAL | Status: DC
Start: 2015-02-05 — End: 2015-02-07
  Administered 2015-02-05 – 2015-02-07 (×4): 1 via ORAL
  Filled 2015-02-05 (×4): qty 1

## 2015-02-05 MED ORDER — GLIMEPIRIDE 2 MG PO TABS
3.0000 mg | ORAL_TABLET | Freq: Two times a day (BID) | ORAL | Status: DC
  Administered 2015-02-05 – 2015-02-07 (×4): 3 mg via ORAL
  Filled 2015-02-05: qty 2
  Filled 2015-02-05: qty 1
  Filled 2015-02-05: qty 1.5
  Filled 2015-02-05: qty 1
  Filled 2015-02-05: qty 2

## 2015-02-05 MED ORDER — MONTELUKAST SODIUM 10 MG PO TABS
10.0000 mg | ORAL_TABLET | Freq: Every day | ORAL | Status: DC
  Administered 2015-02-05 – 2015-02-06 (×2): 10 mg via ORAL
  Filled 2015-02-05 (×2): qty 1

## 2015-02-05 MED ORDER — IPRATROPIUM-ALBUTEROL 0.5-2.5 (3) MG/3ML IN SOLN
3.0000 mL | Freq: Once | RESPIRATORY_TRACT | Status: AC
  Administered 2015-02-05: 3 mL via RESPIRATORY_TRACT
  Filled 2015-02-05: qty 3

## 2015-02-05 MED ORDER — ONDANSETRON HCL 4 MG/2ML IJ SOLN: 4.0000 mg | Freq: Four times a day (QID) | INTRAMUSCULAR | Status: DC | PRN

## 2015-02-05 MED ORDER — TIOTROPIUM BROMIDE MONOHYDRATE 18 MCG IN CAPS
18.0000 ug | ORAL_CAPSULE | Freq: Every day | RESPIRATORY_TRACT | Status: DC
  Administered 2015-02-07: 18 ug via RESPIRATORY_TRACT
  Filled 2015-02-05: qty 5

## 2015-02-05 NOTE — ED Notes (Signed)
Patient brought in from [place] by [facility] for shortness of breath and productive cough x 3-4 days.

## 2015-02-05 NOTE — ED Notes (Signed)
Per Family report patient just finished taking a Z-pak and prednisone

## 2015-02-05 NOTE — H&P (Signed)
[place] [facility] [place] NAME: Cindy [name]    MR#:  [id number]  DATE OF BIRTH:  11-Oct-1933  DATE OF ADMISSION:  02/05/2015  PRIMARY CARE PHYSICIAN: Kirk Ruths., MD   REQUESTING/REFERRING PHYSICIAN: Dr. Conni Slipper  CHIEF COMPLAINT:   Chief Complaint  Patient presents with  . Shortness of Breath    HISTORY OF PRESENT ILLNESS:  Cindy [name]  is a 82 y.o. female with a known history of hypertension, diabetes mellitus, hyperlipidemia lipidemia brought in  because of confusion, knee pain, abdominal pain. According to the family patient was not responding this morning and color was.  Pale.Patient also needed oxygen. She is a resident village of [place]. patient recently was treated for bronchitis with antibiotics, prednisone. She had shortness of breath for last  3 to4 days finished her treatment for the bronchitis. She was hypoxic here.  saturations 86% on room air requiring 2 L of oxygen. On 2 L oxygen saturations 93%. Patient has no chest pain, no fever. Has slight cough. She mainly complains of right knee pain. Patient's family thinks that the because of the mattress that they changed at the nursing home recently. No swelling of the right leg. No recent trauma to the right leg. No recent falls.   PAST MEDICAL HISTORY:   Past Medical History  Diagnosis Date  . Diabetes mellitus without complication ([place])   . Hypertension   . Dementia   . Anemia   . GERD (gastroesophageal reflux disease)   . Hyperlipemia     PAST SURGICAL HISTOIRY:  History reviewed. No pertinent past surgical history.  SOCIAL HISTORY:   Social History  Substance Use Topics  . Smoking status: Former Research scientist (life sciences)  . Smokeless tobacco: Not on file  . Alcohol Use: No    FAMILY HISTORY:  No family history on file.   No family history of hypertension, diabetes. DRUG ALLERGIES:   Allergies  Allergen Reactions  . Lovenox [Enoxaparin Sodium]   . Novolog  [Insulin Aspart]     REVIEW OF SYSTEMS:  CONSTITUTIONAL: No fever, fatigue or weakness.  EYES: No blurred or double vision.  EARS, NOSE, AND THROAT: No tinnitus or ear pain.  RESPIRATORY: cough,SOB. CARDIOVASCULAR: No chest pain, orthopnea, edema.  GASTROINTESTINAL: No nausea, vomiting, diarrhea or abdominal pain.  GENITOURINARY: No dysuria, hematuria.  ENDOCRINE: No polyuria, nocturia,  HEMATOLOGY: No anemia, easy bruising or bleeding SKIN: No rash or lesion. MUSCULOSKELETALright knee pain. NEUROLOGIC: No tingling, numbness, weakness.  PSYCHIATRY: No anxiety or depression.   MEDICATIONS AT HOME:   Prior to Admission medications   Medication Sig Start Date End Date Taking? Authorizing Provider  acetaminophen (TYLENOL) 325 MG tablet Take 650 mg by mouth every 4 (four) hours as needed for moderate pain or fever.   Yes Historical Provider, MD  acetaminophen (TYLENOL) 650 MG CR tablet Take 650 mg by mouth every 12 (twelve) hours.   Yes Historical Provider, MD  bisacodyl (DULCOLAX) 10 MG suppository Place 10 mg rectally daily as needed for mild constipation or moderate constipation. *If Milk of Magnesia does not resolve constipation.*   Yes Historical Provider, MD  calcium-vitamin D (OSCAL WITH D) 500-200 MG-UNIT tablet Take 1 tablet by mouth 2 (two) times daily.   Yes Historical Provider, MD  camphor-menthol Timoteo Ace) lotion Apply 1 application topically as needed for itching.   Yes Historical Provider, MD  cetirizine (ZYRTEC) 5 MG tablet Take 5 mg by mouth at bedtime as needed for allergies.   Yes  Historical Provider, MD  cholecalciferol (VITAMIN D) 1000 units tablet Take 1,000 Units by mouth daily.   Yes Historical Provider, MD  docusate sodium (COLACE) 100 MG capsule Take 100 mg by mouth 2 (two) times daily as needed for mild constipation or moderate constipation.   Yes Historical Provider, MD  Fluticasone-Salmeterol (ADVAIR) 250-50 MCG/DOSE AEPB Inhale 1 puff into the lungs 2 (two) times  daily.   Yes Historical Provider, MD  folic acid (FOLVITE) 401 MCG tablet Take 400 mcg by mouth daily.   Yes Historical Provider, MD  glimepiride (AMARYL) 2 MG tablet Take 3 mg by mouth 2 (two) times daily.   Yes Historical Provider, MD  guaifenesin (ROBITUSSIN) 100 MG/5ML syrup Take 200 mg by mouth every 4 (four) hours as needed for cough. *notify MD if symptoms continue more than 48 hours hours.   Yes Historical Provider, MD  HYDROcodone-acetaminophen (NORCO/VICODIN) 5-325 MG tablet Take 1 tablet by mouth every 4 (four) hours as needed for moderate pain or severe pain.   Yes Historical Provider, MD  insulin regular (NOVOLIN R,HUMULIN R) 100 units/mL injection Inject into the skin 3 (three) times daily before meals. Sliding scale.   Yes Historical Provider, MD  loperamide (IMODIUM A-D) 2 MG tablet Take 2-4 mg by mouth See admin instructions. Take 2 tablets ('4mg'$ ) by mouth for initial loose stool, then take 1 tablet by mouth after each subsequent loose stool as needed up to 8 doses in 24 hours.   Yes Historical Provider, MD  losartan (COZAAR) 50 MG tablet Take 50 mg by mouth daily.   Yes Historical Provider, MD  lovastatin (MEVACOR) 40 MG tablet Take 40 mg by mouth every evening.   Yes Historical Provider, MD  magnesium hydroxide (MILK OF MAGNESIA) 400 MG/5ML suspension Take 30 mLs by mouth daily as needed for mild constipation. *no results in 24 hours may administer bisacodyl suppository*   Yes Historical Provider, MD  metoprolol succinate (TOPROL-XL) 25 MG 24 hr tablet Take 25 mg by mouth daily.   Yes Historical Provider, MD  montelukast (SINGULAIR) 10 MG tablet Take 10 mg by mouth at bedtime.   Yes Historical Provider, MD  omeprazole (PRILOSEC) 20 MG capsule Take 20 mg by mouth daily.   Yes Historical Provider, MD  senna (SENOKOT) 8.6 MG TABS tablet Take 8.6 mg by mouth 2 (two) times daily.   Yes Historical Provider, MD  tiotropium (SPIRIVA) 18 MCG inhalation capsule Place 18 mcg into inhaler and  inhale daily.   Yes Historical Provider, MD  traMADol (ULTRAM) 50 MG tablet Take 50 mg by mouth every 6 (six) hours as needed for moderate pain or severe pain.   Yes Historical Provider, MD  trolamine salicylate (ASPERCREME) 10 % cream Apply 1 application topically 4 (four) times daily as needed for muscle pain. Apply and massage on the right hip/outer thigh.   Yes Historical Provider, MD  vitamin B-12 (CYANOCOBALAMIN) 250 MCG tablet Take 250 mcg by mouth daily.   Yes Historical Provider, MD      VITAL SIGNS:  Blood pressure 135/65, pulse 89, temperature 97.6 F (36.4 C), temperature source Oral, resp. rate 15, height '5\' 5"'$  (1.651 m), weight 75.705 kg (166 lb 14.4 oz), SpO2 86 %.  PHYSICAL EXAMINATION:  GENERAL:  82 y.o.-year-old patient lying in the bed with no acute distress.  EYES: Pupils equal, round, reactive to light and accommodation. No scleral icterus. Extraocular muscles intact.  HEENT: Head atraumatic, normocephalic. Oropharynx and nasopharynx clear.  NECK:  Supple, no jugular  venous distention. No thyroid enlargement, no tenderness.  LUNGS: Normal breath sounds bilaterally, no wheezing, rales,rhonchi or crepitation. No use of accessory muscles of respiration.  CARDIOVASCULAR: S1, S2 normal. No murmurs, rubs, or gallops.  ABDOMEN: Soft, nontender, nondistended. Bowel sounds present. No organomegaly or mass.  EXTREMITIES: No pedal edema, cyanosis, or clubbing. ROM normal at right knee, NEUROLOGIC: Cranial nerves II through XII are intact. Muscle strength 5/5 in all extremities. Sensation intact. Gait not checked.  PSYCHIATRIC: The patient is alert and oriented x 3.  SKIN: No obvious rash, lesion, or ulcer.   LABORATORY PANEL:   CBC  Recent Labs Lab 02/05/15 1203  WBC 15.3*  HGB 15.2  HCT 47.1*  PLT 305   ------------------------------------------------------------------------------------------------------------------  Chemistries   Recent Labs Lab 02/05/15 1203   NA 138  K 4.8  CL 101  CO2 26  GLUCOSE 239*  BUN 34*  CREATININE 1.13*  CALCIUM 9.5   ------------------------------------------------------------------------------------------------------------------  Cardiac Enzymes  Recent Labs Lab 02/05/15 1203  TROPONINI <0.03   ------------------------------------------------------------------------------------------------------------------  RADIOLOGY:  Ct Head Wo Contrast  02/05/2015  CLINICAL DATA:  Altered mental status EXAM: CT HEAD WITHOUT CONTRAST TECHNIQUE: Contiguous axial images were obtained from the base of the skull through the vertex without intravenous contrast. COMPARISON:  None. FINDINGS: There is moderate diffuse atrophy. There is no intracranial mass hemorrhage, extra-axial fluid collection, or midline shift. There is patchy small vessel disease in the centra semiovale bilaterally. There is evidence of a prior small lacunar type infarct in the anterior limb of the left internal capsule. No acute infarct evident. There are scattered foci of calcification in both middle cerebral arteries and both distal vertebral arteries. The bony calvarium appears intact. The mastoid air cells are clear. No intraorbital lesions are appreciable. IMPRESSION: Moderate generalized atrophy with patchy periventricular small vessel disease. 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 No intracranial mass, hemorrhage, or evidence of acute infarct. Electronically Signed   By: Lowella Grip III M.D.   On: 02/05/2015 14:20   Dg Chest Port 1 View  02/05/2015  CLINICAL DATA:  Shortness breath and productive cough 4 days. EXAM: PORTABLE CHEST 1 VIEW COMPARISON:  None. FINDINGS: Lungs are hypoinflated without definite consolidation or effusion cardiomediastinal silhouette is within normal. There is mild calcified plaque over the aortic arch. There are old left posterior lateral rib fractures. IMPRESSION: Hypoinflation without acute cardiopulmonary  disease. Electronically Signed   By: Marin Olp M.D.   On: 02/05/2015 12:46    EKG:   Orders placed or performed during the hospital encounter of 02/05/15  . ED EKG  . ED EKG   normal sinus rhythm at 76 bpm.   IMPRESSION AND PLAN:  #1 confusion likely secondary to hypoxia: Continue oxygen. Patient recently finished antibiotics, prednisone. I don't think she needs any more doses of antibiotics. Her white count is elevated secondary to recent steroid use. #2 right knee pain and right leg pain: Check x-ray of the right knee, ultrasound of the right leg to evaluate for any occult fracture, DVT: If they are negative we will get physical therapy consult and also continue pain medication.   #3 hypoxia probably likely secondary to not taking deep breaths due to pain: Continue oxygen, continue pain medication. #4 hypertension: Controlled continue home medications. #5, diabetes mellitus type 2: continue  Amaryl,Start SSI 6.Acute renal failure;mild with ATN;give NS one litre bolus.   All the records are reviewed and case discussed with ED provider. Management plans discussed with the patient,  family and they are in agreement.  CODE STATUS: full  TOTAL TIME TAKING CARE OF THIS PATIENT: 55 minutes.    Epifanio Lesches M.D on 02/05/2015 at 4:12 PM  Between 7am to 6pm - Pager - [phone]  After 6pm go to [email or web address] - password [email or web address] [facility]  Office  [phone]  CC: Primary care physician; Kirk Ruths., MD  Note: This dictation was prepared with Dragon dictation along with smaller phrase technology. Any transcriptional errors that result from this process are unintentional.

## 2015-02-05 NOTE — ED Provider Notes (Signed)
[facility] Emergency Department Provider Note     Time seen: ----------------------------------------- 11:49 AM on 02/05/2015 -----------------------------------------  L5 caveat: Review of systems and history is limited by dementia  I have reviewed the triage vital signs and the nursing notes.   HISTORY  Chief Complaint Shortness of Breath    HPI Cindy [name] is a 82 y.o. female who presents ER for shortness of breath and productive cough for the last 3-4 days. Patient is brought in from the nursing home with the symptoms, states nothing makes them better or worse. Patient was placed on antibiotic by her primary care doctor and she is just not any better. Patient denies productive cough to me.   Past Medical History  Diagnosis Date  . Diabetes mellitus without complication ([place])   . Hypertension   . Dementia   . Anemia   . GERD (gastroesophageal reflux disease)   . Hyperlipemia     There are no active problems to display for this patient.   History reviewed. No pertinent past surgical history.  Allergies Lovenox and Novolog  Social History Social History  Substance Use Topics  . Smoking status: Former Research scientist (life sciences)  . Smokeless tobacco: None  . Alcohol Use: No    Review of Systems Constitutional: Negative for fever. Eyes: Negative for visual changes. ENT: Negative for sore throat. Cardiovascular: Negative for chest pain. Respiratory: Positive for cough, shortness of breath Gastrointestinal: Negative for abdominal pain, vomiting and diarrhea.   ____________________________________________   PHYSICAL EXAM:  VITAL SIGNS: ED Triage Vitals  Enc Vitals Group     BP --      Pulse Rate 02/05/15 1142 72     Resp 02/05/15 1142 12     Temp 02/05/15 1142 97.6 F (36.4 C)     Temp Source 02/05/15 1142 Oral     SpO2 02/05/15 1142 94 %     Weight 02/05/15 1142 166 lb 14.4 oz (75.705 kg)     Height 02/05/15 1142 '5\' 5"'$  (1.651 m)     Head  Cir --      Peak Flow --      Pain Score 02/05/15 1143 0     Pain Loc --      Pain Edu? --      Excl. in [place]? --     Constitutional: Alert but disoriented. No acute distress Eyes: Conjunctivae are normal. PERRL. Normal extraocular movements. ENT   Head: Normocephalic and atraumatic.   Nose: No congestion/rhinnorhea.   Mouth/Throat: Mucous membranes are moist.   Neck: No stridor. Cardiovascular: Normal rate, regular rhythm. Normal and symmetric distal pulses are present in all extremities. No murmurs, rubs, or gallops. Respiratory: Normal respiratory effort without tachypnea nor retractions. Patient with bilateral rhonchi Gastrointestinal: Soft and nontender. No distention. No abdominal bruits.  Musculoskeletal: Nontender with normal range of motion in all extremities. No joint effusions.  No lower extremity tenderness nor edema. Neurologic:  Normal speech and language. No gross focal neurologic deficits are appreciated.  Skin:  Skin is warm, dry and intact. No rash noted. Psychiatric: Mood and affect are normal. Speech and behavior are normal. Patient exhibits appropriate insight and judgment. ____________________________________________  EKG: Interpreted by me. Normal sinus rhythm with a rate of 76 bpm, normal PR interval, normal QRS, normal QT interval. Normal axis.  ____________________________________________  ED COURSE:  Pertinent labs & imaging results that were available during my care of the patient were reviewed by me and considered in my medical decision making (see chart  for details). Patient with respiration symptoms, will check basic labs and chest x-ray ____________________________________________    LABS (pertinent positives/negatives)  Labs Reviewed  CBC WITH DIFFERENTIAL/PLATELET - Abnormal; Notable for the following:    WBC 15.3 (*)    RBC 5.24 (*)    HCT 47.1 (*)    Neutro Abs 9.9 (*)    Monocytes Absolute 1.2 (*)    Basophils Absolute 0.2 (*)     All other components within normal limits  BASIC METABOLIC PANEL - Abnormal; Notable for the following:    Glucose, Bld 239 (*)    BUN 34 (*)    Creatinine, Ser 1.13 (*)    GFR calc non Af Amer 45 (*)    GFR calc Af Amer 52 (*)    All other components within normal limits  URINALYSIS COMPLETEWITH MICROSCOPIC ([facility] ONLY) - Abnormal; Notable for the following:    Color, Urine YELLOW (*)    APPearance CLEAR (*)    Glucose, UA 50 (*)    Squamous Epithelial / LPF 0-5 (*)    All other components within normal limits  CULTURE, BLOOD (ROUTINE X 2)  CULTURE, BLOOD (ROUTINE X 2)  BRAIN NATRIURETIC PEPTIDE  TROPONIN I  BLOOD GAS, VENOUS    RADIOLOGY Images were viewed by me  Chest x-ray  IMPRESSION: Hypoinflation without acute cardiopulmonary disease. IMPRESSION: Moderate generalized atrophy with patchy periventricular small vessel disease. 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 No intracranial mass, hemorrhage, or evidence of acute infarct. ____________________________________________  FINAL ASSESSMENT AND PLAN  Dyspnea, cough, altered mental status  Plan: Patient with labs and imaging as dictated above. Patient is not acting her normal according to her family. I do not have an expiration for this this time. Leukocytosis likely related to recent steroid intake. Her altered mental status could be steroid-induced as well, will recommend observation for altered mental status.   Earleen Newport, MD   Earleen Newport, MD 02/05/15 [phone]

## 2015-02-06 LAB — CBC
HEMATOCRIT: 42.4 % (ref 35.0–47.0)
HEMOGLOBIN: 14 g/dL (ref 12.0–16.0)
MCH: 29.8 pg (ref 26.0–34.0)
MCHC: 33.1 g/dL (ref 32.0–36.0)
MCV: 90.1 fL (ref 80.0–100.0)
Platelets: 234 10*3/uL (ref 150–440)
RBC: 4.7 MIL/uL (ref 3.80–5.20)
RDW: 13.1 % (ref 11.5–14.5)
WBC: 9.6 10*3/uL (ref 3.6–11.0)

## 2015-02-06 LAB — BASIC METABOLIC PANEL
ANION GAP: 6 (ref 5–15)
BUN: 26 mg/dL — ABNORMAL HIGH (ref 6–20)
CHLORIDE: 100 mmol/L — AB (ref 101–111)
CO2: 31 mmol/L (ref 22–32)
Calcium: 8.7 mg/dL — ABNORMAL LOW (ref 8.9–10.3)
Creatinine, Ser: 0.97 mg/dL (ref 0.44–1.00)
GFR calc Af Amer: >60 mL/min (ref >60)
GFR, EST NON AFRICAN AMERICAN: 54 mL/min — AB (ref >60)
GLUCOSE: 243 mg/dL — AB (ref 65–99)
POTASSIUM: 3.9 mmol/L (ref 3.5–5.1)
SODIUM: 137 mmol/L (ref 135–145)

## 2015-02-06 LAB — GLUCOSE, CAPILLARY: Glucose-Capillary: 204 mg/dL — ABNORMAL HIGH (ref 65–99)

## 2015-02-06 MED ORDER — BUDESONIDE 0.25 MG/2ML IN SUSP
0.2500 mg | Freq: Two times a day (BID) | RESPIRATORY_TRACT | Status: DC
  Administered 2015-02-06 – 2015-02-07 (×3): 0.25 mg via RESPIRATORY_TRACT
  Filled 2015-02-06 (×3): qty 2

## 2015-02-06 MED ORDER — ALBUTEROL SULFATE (2.5 MG/3ML) 0.083% IN NEBU: 2.5000 mg | INHALATION_SOLUTION | Freq: Four times a day (QID) | RESPIRATORY_TRACT | Status: DC | PRN

## 2015-02-06 NOTE — Evaluation (Signed)
Physical Therapy Evaluation Patient Details Name: Cindy [name] MRN: [id number] DOB: 06/20/1933 Today's Date: 02/06/2015   History of Present Illness  Pt found to have hypoxia, altered mental status along with her long term R hip/knee pain.  She is admitted with respiratory failure   Clinical Impression  Pt was able to do some limited ambulation but her R LE pain and general weakness limited her tolerance.  She was on 2 liters and had sats at rest of 95%, but her sats dropped (on 2 liters) during ambulation to low 90s.  Pt shows good effort with PT but is limited and not at her baseline/safe to return to independent living at this time.     Follow Up Recommendations SNF    Equipment Recommendations  None recommended by PT    Recommendations for Other Services       Precautions / Restrictions Precautions Precautions: Fall Restrictions Weight Bearing Restrictions: No      Mobility  Bed Mobility Overal bed mobility: Needs Assistance Bed Mobility: Supine to Sit;Sit to Supine     Supine to sit: Min assist Sit to supine: Min assist   General bed mobility comments: Pt is able to get to EOB with only light assist holding PTs hand  Transfers Overall transfer level: Modified independent Equipment used: Rolling walker (2 wheeled)             General transfer comment: Pt is hesitant to put a lot of weight on the R LE, but she is able to rise to standing w/o direct physical assist  Ambulation/Gait Ambulation/Gait assistance: Min assist Ambulation Distance (Feet): 13 Feet Assistive device: Rolling walker (2 wheeled)       General Gait Details: Pt with very slow, guarded ambualtion.  She is not able to put R heel all the way down and appears to favor it considerably.  She is heavily reliant on the walker and fatigues quickly with the effort needing to rest X2 with the breif bout of ambualtion.   Stairs            Wheelchair Mobility    Modified Rankin (Stroke  Patients Only)       Balance                                             Pertinent Vitals/Pain Pain Assessment:  (not rated, pt has R hip, knee and abdominal pain)    Home Living Family/patient expects to be discharged to:: [place]: Walker - 2 wheels      Prior Function Level of Independence: Independent with assistive device(s)         Comments: Pt reports that she walks down to the dining area daily and is able to be somewhat active     Hand Dominance        Extremity/Trunk Assessment   Upper Extremity Assessment: Generalized weakness (age appropriate deficits)           Lower Extremity Assessment: Generalized weakness (age appropriate deficits, R hip/knee grossly 3+/5)         Communication   Communication: No difficulties  Cognition Arousal/Alertness: Awake/alert Behavior During Therapy: WFL for tasks assessed/performed Overall Cognitive Status: Within Functional Limits for tasks assessed (pt appears to have some mild confusion)  General Comments      Exercises        Assessment/Plan    PT Assessment Patient needs continued PT services  PT Diagnosis Difficulty walking;Generalized weakness;Acute pain   PT Problem List Decreased strength;Decreased balance;Decreased activity tolerance;Decreased range of motion;Decreased mobility;Decreased coordination;Decreased safety awareness;Pain  PT Treatment Interventions Gait training;DME instruction;Stair training;Functional mobility training;Therapeutic activities;Therapeutic exercise;Balance training   PT Goals (Current goals can be found in the Care Plan section) Acute Rehab PT Goals Patient Stated Goal: none stated PT Goal Formulation: With patient Time For Goal Achievement: 02/20/15 Potential to Achieve Goals: Fair    Frequency Min 2X/week   Barriers to discharge        Co-evaluation                End of Session Equipment Utilized During Treatment: Gait belt Activity Tolerance: Patient limited by pain;Patient limited by fatigue Patient left: with bed alarm set;with call bell/phone within reach;with nursing/sitter in room Nurse Communication: Mobility status         Time: 1340-1403 PT Time Calculation (min) (ACUTE ONLY): 23 min   Charges:   PT Evaluation $PT Eval Low Complexity: 1 Procedure     PT G Codes:       Wayne Both, PT, DPT [phone]  Kreg Shropshire 02/06/2015, 4:04 PM

## 2015-02-06 NOTE — Progress Notes (Signed)
Patient confused and trying to get out of bed. Checked her oxygen saturation and was 75% on room air. Replaced oxygen nasal cannula at 2L and encouraged to breath through her nose, oxygen saturation returned to 100%. Patient sitting up in chair with chair alarm activated and oxygen in place.

## 2015-02-06 NOTE — Progress Notes (Signed)
Patient ID: Cindy [name], female   DOB: 21-Mar-1933, 82 y.o.   MRN: [id number] [facility] PROGRESS NOTE  Vonda Harth [id number]:[id number] DOB: 09/23/1933 DOA: 02/05/2015 PCP: Kirk Ruths., MD  HPI/Subjective: Patient had severe right leg pain yesterday. Had pain medications today so not so bad. Hard to pinpoint her pain whether it's her knee or hip or back. She was admitted with confusion and hypoxia. She was recently on steroids and antibiotic.  Objective: Filed Vitals:   02/06/15 0909 02/06/15 1246  BP: 125/60 152/68  Pulse: 79 83  Temp:  98.9 F (37.2 C)  Resp:  22    Filed Weights   02/05/15 1142  Weight: 75.705 kg (166 lb 14.4 oz)    ROS: Review of Systems  Constitutional: Negative for fever and chills.  Eyes: Negative for blurred vision.  Respiratory: Positive for cough and shortness of breath.   Cardiovascular: Negative for chest pain.  Gastrointestinal: Negative for nausea, vomiting, abdominal pain, diarrhea and constipation.  Genitourinary: Negative for dysuria.  Musculoskeletal: Positive for joint pain.  Neurological: Negative for dizziness and headaches.   Exam: Physical Exam  HENT:  Nose: No mucosal edema.  Mouth/Throat: No oropharyngeal exudate or posterior oropharyngeal edema.  Eyes: Conjunctivae, EOM and lids are normal. Pupils are equal, round, and reactive to light.  Neck: No JVD present. Carotid bruit is not present. No edema present. No thyroid mass and no thyromegaly present.  Cardiovascular: S1 normal and S2 normal.  Exam reveals no gallop.   Murmur heard.  Systolic murmur is present with a grade of 2/6  Pulses:      Dorsalis pedis pulses are 2+ on the right side, and 2+ on the left side.  Respiratory: No respiratory distress. She has decreased breath sounds in the right middle field, the right lower field, the left middle field and the left lower field. She has no wheezes. She has no rhonchi. She has no rales.  GI: Soft. Bowel sounds  are normal. There is no tenderness.  Musculoskeletal:       Right hip: She exhibits normal range of motion.       Right knee: She exhibits normal range of motion. Tenderness found.       Right ankle: She exhibits swelling. She exhibits normal range of motion. No tenderness.       Left ankle: She exhibits swelling.  Lymphadenopathy:    She has no cervical adenopathy.  Neurological: She is alert. No cranial nerve deficit.  Skin: Skin is warm. No rash noted. Nails show no clubbing.  Psychiatric: She has a normal mood and affect.      Data Reviewed: Basic Metabolic Panel:  Recent Labs Lab 02/05/15 1203 02/06/15 0445  NA 138 137  K 4.8 3.9  CL 101 100*  CO2 26 31  GLUCOSE 239* 243*  BUN 34* 26*  CREATININE 1.13* 0.97  CALCIUM 9.5 8.7*   CBC:  Recent Labs Lab 02/05/15 1203 02/06/15 0445  WBC 15.3* 9.6  NEUTROABS 9.9*  --   HGB 15.2 14.0  HCT 47.1* 42.4  MCV 89.9 90.1  PLT 305 234   Cardiac Enzymes:  Recent Labs Lab 02/05/15 1203  TROPONINI <0.03   BNP (last 3 results)  Recent Labs  02/05/15 1203  BNP 44.0    CBG:  Recent Labs Lab 02/06/15 0744  GLUCAP 204*    Recent Results (from the past 240 hour(s))  Blood culture (routine x 2)     Status: None (Preliminary result)  Collection Time: 02/05/15 12:03 PM  Result Value Ref Range Status   Specimen Description BLOOD RIGHT ARM  Final   Special Requests BOTTLES DRAWN AEROBIC AND ANAEROBIC  2CC  Final   Culture NO GROWTH <12 HOURS  Final   Report Status PENDING  Incomplete  Blood culture (routine x 2)     Status: None (Preliminary result)   Collection Time: 02/05/15 12:03 PM  Result Value Ref Range Status   Specimen Description BLOOD LEFT WRIST  Final   Special Requests BOTTLES DRAWN AEROBIC AND ANAEROBIC  1CC  Final   Culture NO GROWTH <12 HOURS  Final   Report Status PENDING  Incomplete     Studies: Dg Knee 1-2 Views Right  02/05/2015  CLINICAL DATA:  RIGHT thigh and knee pain per family, no  new injury, surgery to RIGHT hip and femur in June 2016, diabetes mellitus, hypertension EXAM: RIGHT KNEE - 1-2 VIEW COMPARISON:  RIGHT femoral radiographs 11/11/2013 FINDINGS: Osseous demineralization. IM nail with distal locking screws in distal RIGHT femur. Hardware appears intact. Medial compartment joint space narrowing RIGHT knee. No acute fracture, dislocation or bone destruction. No knee joint effusion or regional soft tissue abnormality. Scattered atherosclerotic calcifications of the distal superficial femoral and popliteal arteries. IMPRESSION: Osseous demineralization with evidence of prior RIGHT femoral nailing. Degenerative changes RIGHT knee without acute bony abnormalities. Electronically Signed   By: Lavonia Dana M.D.   On: 02/05/2015 16:55   Ct Head Wo Contrast  02/05/2015  CLINICAL DATA:  Altered mental status EXAM: CT HEAD WITHOUT CONTRAST TECHNIQUE: Contiguous axial images were obtained from the base of the skull through the vertex without intravenous contrast. COMPARISON:  None. FINDINGS: There is moderate diffuse atrophy. There is no intracranial mass hemorrhage, extra-axial fluid collection, or midline shift. There is patchy small vessel disease in the centra semiovale bilaterally. There is evidence of a prior small lacunar type infarct in the anterior limb of the left internal capsule. No acute infarct evident. There are scattered foci of calcification in both middle cerebral arteries and both distal vertebral arteries. The bony calvarium appears intact. The mastoid air cells are clear. No intraorbital lesions are appreciable. IMPRESSION: Moderate generalized atrophy with patchy periventricular small vessel disease. 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 No intracranial mass, hemorrhage, or evidence of acute infarct. Electronically Signed   By: Lowella Grip III M.D.   On: 02/05/2015 14:20   US Venous Img Lower Unilateral Right  02/05/2015  CLINICAL DATA:  Right  lower extremity pain for 1 day EXAM: RIGHT LOWER EXTREMITY VENOUS DUPLEX ULTRASOUND TECHNIQUE: Gray-scale sonography with graded compression, as well as color Doppler and duplex ultrasound were performed to evaluate the right lower extremity deep venous system from the level of the common femoral vein and including the common femoral, femoral, profunda femoral, popliteal and calf veins including the posterior tibial, peroneal and gastrocnemius veins when visible. The superficial great saphenous vein was also interrogated. Spectral Doppler was utilized to evaluate flow at rest and with distal augmentation maneuvers in the common femoral, femoral and popliteal veins. COMPARISON:  None. FINDINGS: Contralateral Common Femoral Vein: Respiratory phasicity is normal and symmetric with the symptomatic side. No evidence of thrombus. Normal compressibility. Common Femoral Vein: No evidence of thrombus. Normal compressibility, respiratory phasicity and response to augmentation. Saphenofemoral Junction: No evidence of thrombus. Normal compressibility and flow on color Doppler imaging. Profunda Femoral Vein: No evidence of thrombus. Normal compressibility and flow on color Doppler imaging. Femoral Vein: No evidence  of thrombus. Normal compressibility, respiratory phasicity and response to augmentation. Popliteal Vein: No evidence of thrombus. Normal compressibility, respiratory phasicity and response to augmentation. Calf Veins: No evidence of thrombus. Normal compressibility and flow on color Doppler imaging. Note that evaluation of the right peroneal vein is limited due to edema. Superficial Great Saphenous Vein: No evidence of thrombus. Normal compressibility and flow on color Doppler imaging. Venous Reflux:  None. Other Findings:  None. IMPRESSION: No evidence of right lower extremity deep venous thrombosis. Note that the right peroneal vein is not well visualized due to soft tissue edema in this area. Left common femoral  vein also patent. Electronically Signed   By: Lowella Grip III M.D.   On: 02/05/2015 18:07   Dg Chest Port 1 View  02/05/2015  CLINICAL DATA:  Shortness breath and productive cough 4 days. EXAM: PORTABLE CHEST 1 VIEW COMPARISON:  None. FINDINGS: Lungs are hypoinflated without definite consolidation or effusion cardiomediastinal silhouette is within normal. There is mild calcified plaque over the aortic arch. There are old left posterior lateral rib fractures. IMPRESSION: Hypoinflation without acute cardiopulmonary disease. Electronically Signed   By: Marin Olp M.D.   On: 02/05/2015 12:46    Scheduled Meds: . budesonide (PULMICORT) nebulizer solution  0.25 mg Nebulization BID  . calcium-vitamin D  1 tablet Oral BID  . cholecalciferol  1,000 Units Oral Daily  . cyanocobalamin  250 mcg Oral Daily  . docusate sodium  100 mg Oral BID  . folic acid  3,568 mcg Oral Daily  . glimepiride  3 mg Oral BID  . losartan  50 mg Oral Daily  . metoprolol succinate  25 mg Oral Daily  . montelukast  10 mg Oral QHS  . pantoprazole  40 mg Oral Daily  . pravastatin  10 mg Oral q1800  . senna  8.6 mg Oral BID  . tiotropium  18 mcg Inhalation Daily   Assessment/Plan:  1. Acute respiratory failure with hypoxia. Patient had a low pulse ox on presentation. The patient seems to be breathing comfortably. We'll check a pulse ox on room air. Patient recently had steroids and antibiotics for lung infection but chest x-ray negative here. I will put on budesonide nebulizers and albuterol nebulizer and see if I can open up her airways so she can breathe better and come off the oxygen. 2. Right leg pain- with my exam difficult to pinpoint what joint is actually hurting. I will get an orthopedic consultation and physical therapy consultation. Right lower extremity negative for DVT 3. Acute encephalopathy and confusion- could be secondary to hypoxia. Patient was also recently on steroids. 4. Dehydration- improved with  IV fluids 5. Type 2 diabetes on glimepiride 6. Essential hypertension- continue current medications 7. Hyperlipidemia unspecified- hold the pravastatin with confusion 8. Gastroesophageal reflux disease on Protonix  Code Status:     Code Status Orders        Start     Ordered   02/05/15 1607  Full code   Continuous     02/05/15 1612    Code Status History    Date Active Date Inactive Code Status Order ID Comments User Context   This patient has a current code status but no historical code status.    Advance Directive Documentation        Most Recent Value   Type of Advance Directive  Healthcare Power of Attorney   Pre-existing out of facility DNR order (yellow form or pink MOST form)     "  MOST" Form in Place?       Family Communication: Family at bedside Disposition Plan: Home soon  Consultants:  Orthopedic  Physical therapy  Time spent: 25 minutes  [place]

## 2015-02-06 NOTE — Consult Note (Signed)
ORTHOPAEDIC CONSULTATION  REQUESTING PHYSICIAN: Loletha Grayer, MD  Chief Complaint: Right knee pain  HPI: Cindy [name] is a 82 y.o. female who complains of  Right knee pain in the past, but now is more comfortable.  Had right hip fx last year with long stem rod inserted.  Xrays show no fx with mild arthritis.  Primary problems are pulmonary.  Past Medical History  Diagnosis Date  . Diabetes mellitus without complication ([place])   . Hypertension   . Dementia   . Anemia   . GERD (gastroesophageal reflux disease)   . Hyperlipemia    History reviewed. No pertinent past surgical history. Social History   Social History  . Marital Status: Widowed    Spouse Name: N/A  . Number of Children: N/A  . Years of Education: N/A   Social History Main Topics  . Smoking status: Former Research scientist (life sciences)  . Smokeless tobacco: None  . Alcohol Use: No  . Drug Use: None  . Sexual Activity: Not Asked   Other Topics Concern  . None   Social History Narrative  . None   No family history on file. Allergies  Allergen Reactions  . Lovenox [Enoxaparin Sodium]   . Novolog [Insulin Aspart]    Prior to Admission medications   Medication Sig Start Date End Date Taking? Authorizing Provider  acetaminophen (TYLENOL) 325 MG tablet Take 650 mg by mouth every 4 (four) hours as needed for moderate pain or fever.   Yes Historical Provider, MD  acetaminophen (TYLENOL) 650 MG CR tablet Take 650 mg by mouth every 12 (twelve) hours.   Yes Historical Provider, MD  bisacodyl (DULCOLAX) 10 MG suppository Place 10 mg rectally daily as needed for mild constipation or moderate constipation. *If Milk of Magnesia does not resolve constipation.*   Yes Historical Provider, MD  calcium-vitamin D (OSCAL WITH D) 500-200 MG-UNIT tablet Take 1 tablet by mouth 2 (two) times daily.   Yes Historical Provider, MD  camphor-menthol Timoteo Ace) lotion Apply 1 application topically as needed for itching.   Yes Historical Provider, MD   cetirizine (ZYRTEC) 5 MG tablet Take 5 mg by mouth at bedtime as needed for allergies.   Yes Historical Provider, MD  cholecalciferol (VITAMIN D) 1000 units tablet Take 1,000 Units by mouth daily.   Yes Historical Provider, MD  docusate sodium (COLACE) 100 MG capsule Take 100 mg by mouth 2 (two) times daily as needed for mild constipation or moderate constipation.   Yes Historical Provider, MD  Fluticasone-Salmeterol (ADVAIR) 250-50 MCG/DOSE AEPB Inhale 1 puff into the lungs 2 (two) times daily.   Yes Historical Provider, MD  folic acid (FOLVITE) 297 MCG tablet Take 400 mcg by mouth daily.   Yes Historical Provider, MD  glimepiride (AMARYL) 2 MG tablet Take 3 mg by mouth 2 (two) times daily.   Yes Historical Provider, MD  guaifenesin (ROBITUSSIN) 100 MG/5ML syrup Take 200 mg by mouth every 4 (four) hours as needed for cough. *notify MD if symptoms continue more than 48 hours hours.   Yes Historical Provider, MD  HYDROcodone-acetaminophen (NORCO/VICODIN) 5-325 MG tablet Take 1 tablet by mouth every 4 (four) hours as needed for moderate pain or severe pain.   Yes Historical Provider, MD  insulin regular (NOVOLIN R,HUMULIN R) 100 units/mL injection Inject into the skin 3 (three) times daily before meals. Sliding scale.   Yes Historical Provider, MD  loperamide (IMODIUM A-D) 2 MG tablet Take 2-4 mg by mouth See admin instructions. Take 2 tablets ('4mg'$ ) by mouth  for initial loose stool, then take 1 tablet by mouth after each subsequent loose stool as needed up to 8 doses in 24 hours.   Yes Historical Provider, MD  losartan (COZAAR) 50 MG tablet Take 50 mg by mouth daily.   Yes Historical Provider, MD  lovastatin (MEVACOR) 40 MG tablet Take 40 mg by mouth every evening.   Yes Historical Provider, MD  magnesium hydroxide (MILK OF MAGNESIA) 400 MG/5ML suspension Take 30 mLs by mouth daily as needed for mild constipation. *no results in 24 hours may administer bisacodyl suppository*   Yes Historical Provider, MD   metoprolol succinate (TOPROL-XL) 25 MG 24 hr tablet Take 25 mg by mouth daily.   Yes Historical Provider, MD  montelukast (SINGULAIR) 10 MG tablet Take 10 mg by mouth at bedtime.   Yes Historical Provider, MD  omeprazole (PRILOSEC) 20 MG capsule Take 20 mg by mouth daily.   Yes Historical Provider, MD  senna (SENOKOT) 8.6 MG TABS tablet Take 8.6 mg by mouth 2 (two) times daily.   Yes Historical Provider, MD  tiotropium (SPIRIVA) 18 MCG inhalation capsule Place 18 mcg into inhaler and inhale daily.   Yes Historical Provider, MD  traMADol (ULTRAM) 50 MG tablet Take 50 mg by mouth every 6 (six) hours as needed for moderate pain or severe pain.   Yes Historical Provider, MD  trolamine salicylate (ASPERCREME) 10 % cream Apply 1 application topically 4 (four) times daily as needed for muscle pain. Apply and massage on the right hip/outer thigh.   Yes Historical Provider, MD  vitamin B-12 (CYANOCOBALAMIN) 250 MCG tablet Take 250 mcg by mouth daily.   Yes Historical Provider, MD   Dg Knee 1-2 Views Right  02/05/2015  CLINICAL DATA:  RIGHT thigh and knee pain per family, no new injury, surgery to RIGHT hip and femur in June 2016, diabetes mellitus, hypertension EXAM: RIGHT KNEE - 1-2 VIEW COMPARISON:  RIGHT femoral radiographs 11/11/2013 FINDINGS: Osseous demineralization. IM nail with distal locking screws in distal RIGHT femur. Hardware appears intact. Medial compartment joint space narrowing RIGHT knee. No acute fracture, dislocation or bone destruction. No knee joint effusion or regional soft tissue abnormality. Scattered atherosclerotic calcifications of the distal superficial femoral and popliteal arteries. IMPRESSION: Osseous demineralization with evidence of prior RIGHT femoral nailing. Degenerative changes RIGHT knee without acute bony abnormalities. Electronically Signed   By: Lavonia Dana M.D.   On: 02/05/2015 16:55   Ct Head Wo Contrast  02/05/2015  CLINICAL DATA:  Altered mental status EXAM: CT  HEAD WITHOUT CONTRAST TECHNIQUE: Contiguous axial images were obtained from the base of the skull through the vertex without intravenous contrast. COMPARISON:  None. FINDINGS: There is moderate diffuse atrophy. There is no intracranial mass hemorrhage, extra-axial fluid collection, or midline shift. There is patchy small vessel disease in the centra semiovale bilaterally. There is evidence of a prior small lacunar type infarct in the anterior limb of the left internal capsule. No acute infarct evident. There are scattered foci of calcification in both middle cerebral arteries and both distal vertebral arteries. The bony calvarium appears intact. The mastoid air cells are clear. No intraorbital lesions are appreciable. IMPRESSION: Moderate generalized atrophy with patchy periventricular small vessel disease. 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 No intracranial mass, hemorrhage, or evidence of acute infarct. Electronically Signed   By: Lowella Grip III M.D.   On: 02/05/2015 14:20   US Venous Img Lower Unilateral Right  02/05/2015  CLINICAL DATA:  Right lower extremity  pain for 1 day EXAM: RIGHT LOWER EXTREMITY VENOUS DUPLEX ULTRASOUND TECHNIQUE: Gray-scale sonography with graded compression, as well as color Doppler and duplex ultrasound were performed to evaluate the right lower extremity deep venous system from the level of the common femoral vein and including the common femoral, femoral, profunda femoral, popliteal and calf veins including the posterior tibial, peroneal and gastrocnemius veins when visible. The superficial great saphenous vein was also interrogated. Spectral Doppler was utilized to evaluate flow at rest and with distal augmentation maneuvers in the common femoral, femoral and popliteal veins. COMPARISON:  None. FINDINGS: Contralateral Common Femoral Vein: Respiratory phasicity is normal and symmetric with the symptomatic side. No evidence of thrombus. Normal  compressibility. Common Femoral Vein: No evidence of thrombus. Normal compressibility, respiratory phasicity and response to augmentation. Saphenofemoral Junction: No evidence of thrombus. Normal compressibility and flow on color Doppler imaging. Profunda Femoral Vein: No evidence of thrombus. Normal compressibility and flow on color Doppler imaging. Femoral Vein: No evidence of thrombus. Normal compressibility, respiratory phasicity and response to augmentation. Popliteal Vein: No evidence of thrombus. Normal compressibility, respiratory phasicity and response to augmentation. Calf Veins: No evidence of thrombus. Normal compressibility and flow on color Doppler imaging. Note that evaluation of the right peroneal vein is limited due to edema. Superficial Great Saphenous Vein: No evidence of thrombus. Normal compressibility and flow on color Doppler imaging. Venous Reflux:  None. Other Findings:  None. IMPRESSION: No evidence of right lower extremity deep venous thrombosis. Note that the right peroneal vein is not well visualized due to soft tissue edema in this area. Left common femoral vein also patent. Electronically Signed   By: Lowella Grip III M.D.   On: 02/05/2015 18:07   Dg Chest Port 1 View  02/05/2015  CLINICAL DATA:  Shortness breath and productive cough 4 days. EXAM: PORTABLE CHEST 1 VIEW COMPARISON:  None. FINDINGS: Lungs are hypoinflated without definite consolidation or effusion cardiomediastinal silhouette is within normal. There is mild calcified plaque over the aortic arch. There are old left posterior lateral rib fractures. IMPRESSION: Hypoinflation without acute cardiopulmonary disease. Electronically Signed   By: Marin Olp M.D.   On: 02/05/2015 12:46    Positive ROS: All other systems have been reviewed and were otherwise negative with the exception of those mentioned in the HPI and as above.  Physical Exam: General: Alert, no acute distress Cardiovascular: No pedal  edema Respiratory: No cyanosis, no use of accessory musculature GI: No organomegaly, abdomen is soft and non-tender Skin: No lesions in the area of chief complaint Neurologic: Sensation intact distally Psychiatric: Patient is competent for consent with normal mood and affect Lymphatic: No axillary or cervical lymphadenopathy  MUSCULOSKELETAL: Right knee non tender or red. Range of motion good.  No swelling or local tenderness.  CSM good.    Assessment: Resolve right knee pain  Plan: Local care as needed    Park Breed, MD [phone]   02/06/2015 6:51 PM

## 2015-02-07 LAB — GLUCOSE, CAPILLARY: Glucose-Capillary: 226 mg/dL — ABNORMAL HIGH (ref 65–99)

## 2015-02-07 MED ORDER — ALBUTEROL SULFATE (2.5 MG/3ML) 0.083% IN NEBU: 2.5000 mg | INHALATION_SOLUTION | Freq: Four times a day (QID) | RESPIRATORY_TRACT | Status: DC

## 2015-02-07 MED ORDER — HYDROCODONE-ACETAMINOPHEN 5-325 MG PO TABS: 1.0000 | ORAL_TABLET | ORAL | Status: DC | PRN

## 2015-02-07 MED ORDER — ROPINIROLE HCL 0.25 MG PO TABS
0.2500 mg | ORAL_TABLET | Freq: Every day | ORAL | Status: DC
  Filled 2015-02-07: qty 1

## 2015-02-07 MED ORDER — BUDESONIDE 0.25 MG/2ML IN SUSP: 0.2500 mg | Freq: Two times a day (BID) | RESPIRATORY_TRACT | Status: DC

## 2015-02-07 MED ORDER — BUDESONIDE 0.25 MG/2ML IN SUSP: 0.2500 mg | Freq: Two times a day (BID) | RESPIRATORY_TRACT | Status: DC | PRN

## 2015-02-07 MED ORDER — ROPINIROLE HCL 0.25 MG PO TABS: 0.2500 mg | ORAL_TABLET | Freq: Every day | ORAL | Status: AC

## 2015-02-07 NOTE — Progress Notes (Signed)
Spoke with [place], [facility] rep at [phone], to notify of non-emergent EMS transport.  Auth notification reference given as [id number].   Service date range good from 02/07/15 - 05/08/15.   Gap exception requested to determine if services can be considered at an in-network level.

## 2015-02-07 NOTE — Discharge Summary (Signed)
[facility] NAME: Amarya Kuehl    MR#:  [id number]  DATE OF BIRTH:  [birth year]  DATE OF ADMISSION:  02/05/2015 ADMITTING PHYSICIAN: Epifanio Lesches, MD  DATE OF DISCHARGE: No discharge date for patient encounter.  PRIMARY CARE PHYSICIAN: Kirk Ruths., MD    ADMISSION DIAGNOSIS:  Cough [R05] Pain [R52] Altered mental status, unspecified altered mental status type [R41.82]  DISCHARGE DIAGNOSIS:  Active Problems:   Acute respiratory failure with hypoxia ([place])   SECONDARY DIAGNOSIS:   Past Medical History  Diagnosis Date  . Diabetes mellitus without complication ([place])   . Hypertension   . Dementia   . Anemia   . GERD (gastroesophageal reflux disease)   . Hyperlipemia     HOSPITAL COURSE:   1. Acute respiratory failure with hypoxia. Patient was given oxygen supplementation. She recently had a cold and received steroid and antibiotic. I started nebulizer treatments and budesonide nebulizers. She is now off oxygen and breathing comfortably. I would continue the budesonide nebulizers for 2 weeks then can go back to her usual Advair. 2. Right leg pain mostly at night. I was unable to elicit her pain. Orthopedic surgery saw the patient in consultation unable to elicit her pain. Pain medication helped. Since this happens mostly at night I'll give a trial of Requip to see if this is restless leg syndrome. If no response to the Requip can stop that in a week or 2. If it helps can continue. 3. Dementia with periods of confusion. Try to get the patient back to her home environment 4. Dehydration patient was given some hydration 5. Diabetes mellitus without complication continue her usual medications. Sugars were recently high with steroid course 6. Gastroesophageal reflux disease without esophagitis on PPI 7. Hyperlipidemia unspecified- I stopped the statin just in case that was contributing to the  confusion.  DISCHARGE CONDITIONS:   Satisfactory  CONSULTS OBTAINED:  Treatment Team:  Earnestine Leys, MD  DRUG ALLERGIES:   Allergies  Allergen Reactions  . Lovenox [Enoxaparin Sodium]   . Novolog [Insulin Aspart]     DISCHARGE MEDICATIONS:   Current Discharge Medication List    START taking these medications   Details  albuterol (PROVENTIL) (2.5 MG/3ML) 0.083% nebulizer solution Take 3 mLs (2.5 mg total) by nebulization every 6 (six) hours. Qty: 75 mL, Refills: 12    budesonide (PULMICORT) 0.25 MG/2ML nebulizer solution Take 2 mLs (0.25 mg total) by nebulization 2 (two) times daily. Qty: 30 mL, Refills: 0    rOPINIRole (REQUIP) 0.25 MG tablet Take 1 tablet (0.25 mg total) by mouth at bedtime. Qty: 30 tablet, Refills: 0      CONTINUE these medications which have CHANGED   Details  HYDROcodone-acetaminophen (NORCO/VICODIN) 5-325 MG tablet Take 1 tablet by mouth every 4 (four) hours as needed for moderate pain or severe pain. Qty: 30 tablet, Refills: 0      CONTINUE these medications which have NOT CHANGED   Details  acetaminophen (TYLENOL) 325 MG tablet Take 650 mg by mouth every 4 (four) hours as needed for moderate pain or fever.    acetaminophen (TYLENOL) 650 MG CR tablet Take 650 mg by mouth every 12 (twelve) hours.    bisacodyl (DULCOLAX) 10 MG suppository Place 10 mg rectally daily as needed for mild constipation or moderate constipation. *If Milk of Magnesia does not resolve constipation.*    calcium-vitamin D (OSCAL WITH D) 500-200 MG-UNIT tablet Take 1 tablet by mouth 2 (two) times  daily.    camphor-menthol (SARNA) lotion Apply 1 application topically as needed for itching.    cetirizine (ZYRTEC) 5 MG tablet Take 5 mg by mouth at bedtime as needed for allergies.    cholecalciferol (VITAMIN D) 1000 units tablet Take 1,000 Units by mouth daily.    docusate sodium (COLACE) 100 MG capsule Take 100 mg by mouth 2 (two) times daily as needed for mild  constipation or moderate constipation.    folic acid (FOLVITE) 099 MCG tablet Take 400 mcg by mouth daily.    glimepiride (AMARYL) 2 MG tablet Take 3 mg by mouth 2 (two) times daily.    guaifenesin (ROBITUSSIN) 100 MG/5ML syrup Take 200 mg by mouth every 4 (four) hours as needed for cough. *notify MD if symptoms continue more than 48 hours hours.    insulin regular (NOVOLIN R,HUMULIN R) 100 units/mL injection Inject into the skin 3 (three) times daily before meals. Sliding scale.    loperamide (IMODIUM A-D) 2 MG tablet Take 2-4 mg by mouth See admin instructions. Take 2 tablets ('4mg'$ ) by mouth for initial loose stool, then take 1 tablet by mouth after each subsequent loose stool as needed up to 8 doses in 24 hours.    losartan (COZAAR) 50 MG tablet Take 50 mg by mouth daily.    magnesium hydroxide (MILK OF MAGNESIA) 400 MG/5ML suspension Take 30 mLs by mouth daily as needed for mild constipation. *no results in 24 hours may administer bisacodyl suppository*    metoprolol succinate (TOPROL-XL) 25 MG 24 hr tablet Take 25 mg by mouth daily.    montelukast (SINGULAIR) 10 MG tablet Take 10 mg by mouth at bedtime.    omeprazole (PRILOSEC) 20 MG capsule Take 20 mg by mouth daily.    senna (SENOKOT) 8.6 MG TABS tablet Take 8.6 mg by mouth 2 (two) times daily.    tiotropium (SPIRIVA) 18 MCG inhalation capsule Place 18 mcg into inhaler and inhale daily.    traMADol (ULTRAM) 50 MG tablet Take 50 mg by mouth every 6 (six) hours as needed for moderate pain or severe pain.    trolamine salicylate (ASPERCREME) 10 % cream Apply 1 application topically 4 (four) times daily as needed for muscle pain. Apply and massage on the right hip/outer thigh.    vitamin B-12 (CYANOCOBALAMIN) 250 MCG tablet Take 250 mcg by mouth daily.      STOP taking these medications     Fluticasone-Salmeterol (ADVAIR) 250-50 MCG/DOSE AEPB      lovastatin (MEVACOR) 40 MG tablet          DISCHARGE INSTRUCTIONS:    Follow-up with physical therapy at facility Follow up with doctor at facility couple days  If you experience worsening of your admission symptoms, develop shortness of breath, life threatening emergency, suicidal or homicidal thoughts you must seek medical attention immediately by calling 911 or calling your MD immediately  if symptoms less severe.  You Must read complete instructions/literature along with all the possible adverse reactions/side effects for all the Medicines you take and that have been prescribed to you. Take any new Medicines after you have completely understood and accept all the possible adverse reactions/side effects.   Please note  You were cared for by a hospitalist during your hospital stay. If you have any questions about your discharge medications or the care you received while you were in the hospital after you are discharged, you can call the unit and asked to speak with the hospitalist on call if the  hospitalist that took care of you is not available. Once you are discharged, your primary care physician will handle any further medical issues. Please note that NO REFILLS for any discharge medications will be authorized once you are discharged, as it is imperative that you return to your primary care physician (or establish a relationship with a primary care physician if you do not have one) for your aftercare needs so that they can reassess your need for medications and monitor your lab values.    Today   CHIEF COMPLAINT:   Chief Complaint  Patient presents with  . Shortness of Breath    HISTORY OF PRESENT ILLNESS:  Talaysha Freeberg  is a 82 y.o. female with recent upper respiratory tract infection presented with shortness of breath. Found to have a low oxygen saturation. Also right leg pain   VITAL SIGNS:  Blood pressure 126/61, pulse 74, temperature 97.5 F (36.4 C), temperature source Oral, resp. rate 16, height '5\' 5"'$  (1.651 m), weight 69.174 kg (152 lb 8  oz), SpO2 93 %.     PHYSICAL EXAMINATION:  GENERAL:  82 y.o.-year-old patient lying in the bed with no acute distress.  EYES: Pupils equal, round, reactive to light and accommodation. No scleral icterus. Extraocular muscles intact.  HEENT: Head atraumatic, normocephalic. Oropharynx and nasopharynx clear.  NECK:  Supple, no jugular venous distention. No thyroid enlargement, no tenderness.  LUNGS: Normal breath sounds bilaterally, no wheezing, rales,rhonchi or crepitation. No use of accessory muscles of respiration.  CARDIOVASCULAR: S1, S2 normal. No murmurs, rubs, or gallops.  ABDOMEN: Soft, non-tender, non-distended. Bowel sounds present. No organomegaly or mass.  EXTREMITIES: No pedal edema, cyanosis, or clubbing. Unable to elicit any pain in her right lower extremity at this time good range of motion ankle knee and hip. Patient able to straight leg raise without a problem. NEUROLOGIC: Cranial nerves II through XII are intact. Muscle strength 5/5 in all extremities. Sensation intact. Gait not checked.  PSYCHIATRIC: The patient is alert.  SKIN: No obvious rash, lesion, or ulcer.   DATA REVIEW:   CBC  Recent Labs Lab 02/06/15 0445  WBC 9.6  HGB 14.0  HCT 42.4  PLT 234    Chemistries   Recent Labs Lab 02/06/15 0445  NA 137  K 3.9  CL 100*  CO2 31  GLUCOSE 243*  BUN 26*  CREATININE 0.97  CALCIUM 8.7*    Cardiac Enzymes  Recent Labs Lab 02/05/15 1203  TROPONINI <0.03    Microbiology Results  Results for orders placed or performed during the hospital encounter of 02/05/15  Blood culture (routine x 2)     Status: None (Preliminary result)   Collection Time: 02/05/15 12:03 PM  Result Value Ref Range Status   Specimen Description BLOOD RIGHT ARM  Final   Special Requests BOTTLES DRAWN AEROBIC AND ANAEROBIC  2CC  Final   Culture NO GROWTH 1 DAY  Final   Report Status PENDING  Incomplete  Blood culture (routine x 2)     Status: None (Preliminary result)    Collection Time: 02/05/15 12:03 PM  Result Value Ref Range Status   Specimen Description BLOOD LEFT WRIST  Final   Special Requests BOTTLES DRAWN AEROBIC AND ANAEROBIC  1CC  Final   Culture NO GROWTH 1 DAY  Final   Report Status PENDING  Incomplete    RADIOLOGY:  Dg Knee 1-2 Views Right  02/05/2015  CLINICAL DATA:  RIGHT thigh and knee pain per family, no new injury, surgery to  RIGHT hip and femur in June 2016, diabetes mellitus, hypertension EXAM: RIGHT KNEE - 1-2 VIEW COMPARISON:  RIGHT femoral radiographs 11/11/2013 FINDINGS: Osseous demineralization. IM nail with distal locking screws in distal RIGHT femur. Hardware appears intact. Medial compartment joint space narrowing RIGHT knee. No acute fracture, dislocation or bone destruction. No knee joint effusion or regional soft tissue abnormality. Scattered atherosclerotic calcifications of the distal superficial femoral and popliteal arteries. IMPRESSION: Osseous demineralization with evidence of prior RIGHT femoral nailing. Degenerative changes RIGHT knee without acute bony abnormalities. Electronically Signed   By: Lavonia Dana M.D.   On: 02/05/2015 16:55   Ct Head Wo Contrast  02/05/2015  CLINICAL DATA:  Altered mental status EXAM: CT HEAD WITHOUT CONTRAST TECHNIQUE: Contiguous axial images were obtained from the base of the skull through the vertex without intravenous contrast. COMPARISON:  None. FINDINGS: There is moderate diffuse atrophy. There is no intracranial mass hemorrhage, extra-axial fluid collection, or midline shift. There is patchy small vessel disease in the centra semiovale bilaterally. There is evidence of a prior small lacunar type infarct in the anterior limb of the left internal capsule. No acute infarct evident. There are scattered foci of calcification in both middle cerebral arteries and both distal vertebral arteries. The bony calvarium appears intact. The mastoid air cells are clear. No intraorbital lesions are appreciable.  IMPRESSION: Moderate generalized atrophy with patchy periventricular small vessel disease. 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 No intracranial mass, hemorrhage, or evidence of acute infarct. Electronically Signed   By: Lowella Grip III M.D.   On: 02/05/2015 14:20   US Venous Img Lower Unilateral Right  02/05/2015  CLINICAL DATA:  Right lower extremity pain for 1 day EXAM: RIGHT LOWER EXTREMITY VENOUS DUPLEX ULTRASOUND TECHNIQUE: Gray-scale sonography with graded compression, as well as color Doppler and duplex ultrasound were performed to evaluate the right lower extremity deep venous system from the level of the common femoral vein and including the common femoral, femoral, profunda femoral, popliteal and calf veins including the posterior tibial, peroneal and gastrocnemius veins when visible. The superficial great saphenous vein was also interrogated. Spectral Doppler was utilized to evaluate flow at rest and with distal augmentation maneuvers in the common femoral, femoral and popliteal veins. COMPARISON:  None. FINDINGS: Contralateral Common Femoral Vein: Respiratory phasicity is normal and symmetric with the symptomatic side. No evidence of thrombus. Normal compressibility. Common Femoral Vein: No evidence of thrombus. Normal compressibility, respiratory phasicity and response to augmentation. Saphenofemoral Junction: No evidence of thrombus. Normal compressibility and flow on color Doppler imaging. Profunda Femoral Vein: No evidence of thrombus. Normal compressibility and flow on color Doppler imaging. Femoral Vein: No evidence of thrombus. Normal compressibility, respiratory phasicity and response to augmentation. Popliteal Vein: No evidence of thrombus. Normal compressibility, respiratory phasicity and response to augmentation. Calf Veins: No evidence of thrombus. Normal compressibility and flow on color Doppler imaging. Note that evaluation of the right peroneal vein is limited  due to edema. Superficial Great Saphenous Vein: No evidence of thrombus. Normal compressibility and flow on color Doppler imaging. Venous Reflux:  None. Other Findings:  None. IMPRESSION: No evidence of right lower extremity deep venous thrombosis. Note that the right peroneal vein is not well visualized due to soft tissue edema in this area. Left common femoral vein also patent. Electronically Signed   By: Lowella Grip III M.D.   On: 02/05/2015 18:07   Dg Chest Port 1 View  02/05/2015  CLINICAL DATA:  Shortness breath and productive cough  4 days. EXAM: PORTABLE CHEST 1 VIEW COMPARISON:  None. FINDINGS: Lungs are hypoinflated without definite consolidation or effusion cardiomediastinal silhouette is within normal. There is mild calcified plaque over the aortic arch. There are old left posterior lateral rib fractures. IMPRESSION: Hypoinflation without acute cardiopulmonary disease. Electronically Signed   By: Marin Olp M.D.   On: 02/05/2015 12:46    Management plans discussed with the patient, family and they are in agreement.  CODE STATUS:     Code Status Orders        Start     Ordered   02/05/15 1607  Full code   Continuous     02/05/15 1612    Code Status History    Date Active Date Inactive Code Status Order ID Comments User Context   This patient has a current code status but no historical code status.    Advance Directive Documentation        Most Recent Value   Type of Advance Directive  Healthcare Power of Attorney   Pre-existing out of facility DNR order (yellow form or pink MOST form)     "MOST" Form in Place?        TOTAL TIME TAKING CARE OF THIS PATIENT: 35 minutes.    Loletha Grayer M.D on 02/07/2015 at 8:13 AM  Between 7am to 6pm - Pager - [phone]  After 6pm go to [email or web address] - password [email or web address] [facility]  Office  [phone]  CC: Primary care physician; Kirk Ruths., MD

## 2015-02-07 NOTE — Progress Notes (Signed)
Pt A and O X2. VSS. Pt has stable VS on room air at this time. Pt tolerating diet well. No complaints of pain or nausea. IV removed intact. Report called to [facility] to nurse Judson Roch. Pt transported by EMS to facility.

## 2015-02-07 NOTE — Care Management Important Message (Signed)
Important Message  Patient Details  Name: Cindy [name] MRN: [id number] Date of Birth: January 08, 1934   Medicare Important Message Given:  Yes    Juliann Pulse A Porfirio Bollier 02/07/2015, 9:25 AM

## 2015-02-07 NOTE — NC FL2 (Signed)
[place] LEVEL OF CARE SCREENING TOOL     IDENTIFICATION  Patient Name: Cindy [name] Birthdate: October 31, 1933 Sex: female Admission Date (Current Location): 02/05/2015  [facility] and [place] Number:  Engineering geologist and Address:  [facility], [address] [place], [place] [address]      Provider Number: [id number]  Attending Physician Name and Address:  Loletha Grayer, MD  Relative Name and Phone Number:       Current Level of Care: Hospital Recommended Level of Care: [place] Prior Approval Number:    Date Approved/Denied:   PASRR Number:    Discharge Plan: SNF    Current Diagnoses: Patient Active Problem List   Diagnosis Date Noted  . Acute respiratory failure with hypoxia ([facility]) 02/05/2015    Orientation RESPIRATION BLADDER Height & Weight       Normal Incontinent   152 lbs.  BEHAVIORAL SYMPTOMS/MOOD NEUROLOGICAL BOWEL NUTRITION STATUS   (none)  (none) Incontinent Diet  AMBULATORY STATUS COMMUNICATION OF NEEDS Skin   Extensive Assist Verbally Normal                       Personal Care Assistance Level of Assistance  Total care, Bathing, Dressing, Feeding Bathing Assistance: Maximum assistance Feeding assistance: Maximum assistance Dressing Assistance: Maximum assistance Total Care Assistance: Maximum assistance   Functional Limitations Info             SPECIAL CARE FACTORS FREQUENCY                       Contractures Contractures Info: Not present    Additional Factors Info  Code Status, Allergies Code Status Info: FULL Allergies Info: lovenox,novolog            Current Medications (02/07/2015):  This is the current hospital active medication list Current Facility-Administered Medications  Medication Dose Route Frequency Provider Last Rate Last Dose  . acetaminophen (TYLENOL) tablet 650 mg  650 mg Oral Q6H PRN Epifanio Lesches, MD       Or  . acetaminophen  (TYLENOL) suppository 650 mg  650 mg Rectal Q6H PRN Epifanio Lesches, MD      . albuterol (PROVENTIL) (2.5 MG/3ML) 0.083% nebulizer solution 2.5 mg  2.5 mg Nebulization Q6H PRN Loletha Grayer, MD      . alum & mag hydroxide-simeth (MAALOX/MYLANTA) 200-200-20 MG/5ML suspension 30 mL  30 mL Oral Q6H PRN Epifanio Lesches, MD      . bisacodyl (DULCOLAX) EC tablet 5 mg  5 mg Oral Daily PRN Epifanio Lesches, MD      . bisacodyl (DULCOLAX) suppository 10 mg  10 mg Rectal Daily PRN Epifanio Lesches, MD      . budesonide (PULMICORT) nebulizer solution 0.25 mg  0.25 mg Nebulization BID Loletha Grayer, MD   0.25 mg at 02/07/15 0807  . calcium-vitamin D (OSCAL WITH D) 500-200 MG-UNIT per tablet 1 tablet  1 tablet Oral BID Epifanio Lesches, MD   1 tablet at 02/06/15 2238  . cholecalciferol (VITAMIN D) tablet 1,000 Units  1,000 Units Oral Daily Epifanio Lesches, MD   1,000 Units at 02/06/15 0917  . cyanocobalamin tablet 250 mcg  250 mcg Oral Daily Epifanio Lesches, MD   250 mcg at 02/06/15 1829  . docusate sodium (COLACE) capsule 100 mg  100 mg Oral BID Epifanio Lesches, MD   100 mg at 02/06/15 2238  . folic acid (FOLVITE) tablet 1 mg  1,000 mcg Oral Daily  Epifanio Lesches, MD   1 mg at 02/06/15 0917  . glimepiride (AMARYL) tablet 3 mg  3 mg Oral BID Lenis Noon, RPH   3 mg at 02/06/15 0037  . guaifenesin (ROBITUSSIN) 100 MG/5ML syrup 200 mg  200 mg Oral Q4H PRN Epifanio Lesches, MD      . HYDROcodone-acetaminophen (NORCO/VICODIN) 5-325 MG per tablet 1 tablet  1 tablet Oral Q4H PRN Epifanio Lesches, MD   1 tablet at 02/06/15 2342  . losartan (COZAAR) tablet 50 mg  50 mg Oral Daily Lenis Noon, RPH   50 mg at 02/06/15 0488  . magnesium hydroxide (MILK OF MAGNESIA) suspension 30 mL  30 mL Oral Daily PRN Epifanio Lesches, MD      . metoprolol succinate (TOPROL-XL) 24 hr tablet 25 mg  25 mg Oral Daily Lenis Noon, RPH   25 mg at 02/06/15 8916  . montelukast (SINGULAIR)  tablet 10 mg  10 mg Oral QHS Epifanio Lesches, MD   10 mg at 02/06/15 2238  . ondansetron (ZOFRAN) tablet 4 mg  4 mg Oral Q6H PRN Epifanio Lesches, MD       Or  . ondansetron (ZOFRAN) injection 4 mg  4 mg Intravenous Q6H PRN Epifanio Lesches, MD      . pantoprazole (PROTONIX) EC tablet 40 mg  40 mg Oral Daily Epifanio Lesches, MD   40 mg at 02/06/15 0915  . rOPINIRole (REQUIP) tablet 0.25 mg  0.25 mg Oral QHS Loletha Grayer, MD      . [facility]) tablet 8.6 mg  8.6 mg Oral BID Epifanio Lesches, MD   8.6 mg at 02/06/15 2238  . tiotropium (SPIRIVA) inhalation capsule 18 mcg  18 mcg Inhalation Daily Epifanio Lesches, MD   18 mcg at 02/07/15 0833  . traMADol (ULTRAM) tablet 50 mg  50 mg Oral Q6H PRN Epifanio Lesches, MD      . traZODone (DESYREL) tablet 25 mg  25 mg Oral QHS PRN Epifanio Lesches, MD   25 mg at 02/06/15 2236     Discharge Medications: Please see discharge summary for a list of discharge medications.  Relevant Imaging Results:  Relevant Lab Results:   Additional Information    Shela Leff, LCSW

## 2015-02-08 LAB — BLOOD GAS, VENOUS
PATIENT TEMPERATURE: 37
PCO2 VEN: 60 mmHg (ref 44.0–60.0)
pH, Ven: 7.35 (ref 7.320–7.430)

## 2015-02-09 DIAGNOSIS — J449 Chronic obstructive pulmonary disease, unspecified: Secondary | ICD-10-CM | POA: Diagnosis not present

## 2015-02-10 ENCOUNTER — Encounter: Payer: Self-pay | Admitting: Internal Medicine

## 2015-02-10 LAB — CULTURE, BLOOD (ROUTINE X 2)
CULTURE: NO GROWTH
CULTURE: NO GROWTH

## 2015-02-16 DIAGNOSIS — M25551 Pain in right hip: Secondary | ICD-10-CM | POA: Diagnosis not present

## 2015-02-16 DIAGNOSIS — M545 Low back pain: Secondary | ICD-10-CM | POA: Diagnosis not present

## 2015-02-16 DIAGNOSIS — E119 Type 2 diabetes mellitus without complications: Secondary | ICD-10-CM | POA: Diagnosis not present

## 2015-02-16 DIAGNOSIS — R39198 Other difficulties with micturition: Secondary | ICD-10-CM | POA: Diagnosis not present

## 2015-02-16 LAB — URINALYSIS COMPLETE WITH MICROSCOPIC (ARMC ONLY)
Bilirubin Urine: NEGATIVE
GLUCOSE, UA: NEGATIVE mg/dL
HGB URINE DIPSTICK: NEGATIVE
LEUKOCYTES UA: NEGATIVE
Nitrite: NEGATIVE
PH: 5 (ref 5.0–8.0)
Protein, ur: NEGATIVE mg/dL
RBC / HPF: NONE SEEN RBC/hpf (ref 0–5)
SPECIFIC GRAVITY, URINE: 1.026 (ref 1.005–1.030)

## 2015-02-19 DIAGNOSIS — R39198 Other difficulties with micturition: Secondary | ICD-10-CM | POA: Diagnosis not present

## 2015-02-19 DIAGNOSIS — E119 Type 2 diabetes mellitus without complications: Secondary | ICD-10-CM | POA: Diagnosis not present

## 2015-02-19 LAB — URINALYSIS COMPLETE WITH MICROSCOPIC (ARMC ONLY)
BILIRUBIN URINE: NEGATIVE
Bacteria, UA: NONE SEEN
HGB URINE DIPSTICK: NEGATIVE
Ketones, ur: NEGATIVE mg/dL
LEUKOCYTES UA: NEGATIVE
NITRITE: NEGATIVE
Protein, ur: NEGATIVE mg/dL
Specific Gravity, Urine: 1.025 (ref 1.005–1.030)
pH: 6 (ref 5.0–8.0)

## 2015-02-19 LAB — URINE CULTURE

## 2015-02-21 LAB — URINE CULTURE

## 2015-02-23 ENCOUNTER — Encounter
Admission: RE | Admit: 2015-02-23 | Discharge: 2015-02-23 | Disposition: A | Discharge status: Home / Self Care | Payer: Medicare Other | Source: Ambulatory Visit | Attending: Internal Medicine | Admitting: Internal Medicine

## 2015-02-23 DIAGNOSIS — E119 Type 2 diabetes mellitus without complications: Secondary | ICD-10-CM | POA: Insufficient documentation

## 2015-03-13 DIAGNOSIS — E119 Type 2 diabetes mellitus without complications: Secondary | ICD-10-CM | POA: Diagnosis not present

## 2015-03-13 LAB — GLUCOSE, CAPILLARY: Glucose-Capillary: 164 mg/dL — ABNORMAL HIGH (ref 65–99)

## 2015-03-20 DIAGNOSIS — E119 Type 2 diabetes mellitus without complications: Secondary | ICD-10-CM | POA: Diagnosis not present

## 2015-03-20 LAB — GLUCOSE, CAPILLARY: Glucose-Capillary: 168 mg/dL — ABNORMAL HIGH (ref 65–99)

## 2015-03-23 ENCOUNTER — Encounter
Admission: RE | Admit: 2015-03-23 | Discharge: 2015-03-23 | Disposition: A | Discharge status: Home / Self Care | Payer: Medicare Other | Source: Ambulatory Visit | Attending: Internal Medicine | Admitting: Internal Medicine

## 2015-03-23 DIAGNOSIS — E119 Type 2 diabetes mellitus without complications: Secondary | ICD-10-CM | POA: Insufficient documentation

## 2015-03-23 DIAGNOSIS — R05 Cough: Secondary | ICD-10-CM | POA: Insufficient documentation

## 2015-03-23 DIAGNOSIS — R0989 Other specified symptoms and signs involving the circulatory and respiratory systems: Secondary | ICD-10-CM | POA: Insufficient documentation

## 2015-03-31 DIAGNOSIS — M25571 Pain in right ankle and joints of right foot: Secondary | ICD-10-CM | POA: Diagnosis not present

## 2015-03-31 DIAGNOSIS — I10 Essential (primary) hypertension: Secondary | ICD-10-CM | POA: Diagnosis not present

## 2015-03-31 DIAGNOSIS — R05 Cough: Secondary | ICD-10-CM | POA: Diagnosis not present

## 2015-03-31 DIAGNOSIS — R0989 Other specified symptoms and signs involving the circulatory and respiratory systems: Secondary | ICD-10-CM | POA: Diagnosis not present

## 2015-03-31 DIAGNOSIS — M79671 Pain in right foot: Secondary | ICD-10-CM | POA: Diagnosis not present

## 2015-04-01 DIAGNOSIS — I10 Essential (primary) hypertension: Secondary | ICD-10-CM | POA: Diagnosis not present

## 2015-04-01 DIAGNOSIS — R05 Cough: Secondary | ICD-10-CM | POA: Diagnosis not present

## 2015-04-01 DIAGNOSIS — R0989 Other specified symptoms and signs involving the circulatory and respiratory systems: Secondary | ICD-10-CM | POA: Diagnosis not present

## 2015-04-01 DIAGNOSIS — E119 Type 2 diabetes mellitus without complications: Secondary | ICD-10-CM | POA: Diagnosis not present

## 2015-04-01 LAB — CBC WITH DIFFERENTIAL/PLATELET
Basophils Absolute: 0.1 10*3/uL (ref 0–0.1)
Basophils Relative: 1 %
Eosinophils Absolute: 0.4 10*3/uL (ref 0–0.7)
Eosinophils Relative: 6 %
HEMATOCRIT: 39.3 % (ref 35.0–47.0)
Hemoglobin: 13 g/dL (ref 12.0–16.0)
LYMPHS PCT: 44 %
Lymphs Abs: 3.3 10*3/uL (ref 1.0–3.6)
MCH: 28.9 pg (ref 26.0–34.0)
MCHC: 33 g/dL (ref 32.0–36.0)
MCV: 87.6 fL (ref 80.0–100.0)
MONO ABS: 0.6 10*3/uL (ref 0.2–0.9)
MONOS PCT: 9 %
NEUTROS ABS: 2.9 10*3/uL (ref 1.4–6.5)
Neutrophils Relative %: 40 %
Platelets: 250 10*3/uL (ref 150–440)
RBC: 4.49 MIL/uL (ref 3.80–5.20)
RDW: 13.7 % (ref 11.5–14.5)
WBC: 7.3 10*3/uL (ref 3.6–11.0)

## 2015-04-01 LAB — COMPREHENSIVE METABOLIC PANEL
ALK PHOS: 48 U/L (ref 38–126)
ALT: 12 U/L — ABNORMAL LOW (ref 14–54)
ANION GAP: 7 (ref 5–15)
AST: 16 U/L (ref 15–41)
Albumin: 3.3 g/dL — ABNORMAL LOW (ref 3.5–5.0)
BILIRUBIN TOTAL: 0.5 mg/dL (ref 0.3–1.2)
BUN: 19 mg/dL (ref 6–20)
CALCIUM: 8.7 mg/dL — AB (ref 8.9–10.3)
CO2: 26 mmol/L (ref 22–32)
Chloride: 105 mmol/L (ref 101–111)
Creatinine, Ser: 0.76 mg/dL (ref 0.44–1.00)
GFR calc Af Amer: >60 mL/min (ref >60)
Glucose, Bld: 163 mg/dL — ABNORMAL HIGH (ref 65–99)
POTASSIUM: 3.4 mmol/L — AB (ref 3.5–5.1)
Sodium: 138 mmol/L (ref 135–145)
TOTAL PROTEIN: 5.8 g/dL — AB (ref 6.5–8.1)

## 2015-04-01 LAB — TSH: TSH: 1.451 u[IU]/mL (ref 0.350–4.500)

## 2015-04-15 DIAGNOSIS — R0989 Other specified symptoms and signs involving the circulatory and respiratory systems: Secondary | ICD-10-CM | POA: Diagnosis not present

## 2015-04-15 DIAGNOSIS — R05 Cough: Secondary | ICD-10-CM | POA: Diagnosis not present

## 2015-04-15 DIAGNOSIS — E119 Type 2 diabetes mellitus without complications: Secondary | ICD-10-CM | POA: Diagnosis not present

## 2015-04-16 DIAGNOSIS — E119 Type 2 diabetes mellitus without complications: Secondary | ICD-10-CM | POA: Diagnosis not present

## 2015-04-16 DIAGNOSIS — R05 Cough: Secondary | ICD-10-CM | POA: Diagnosis not present

## 2015-04-16 DIAGNOSIS — R0989 Other specified symptoms and signs involving the circulatory and respiratory systems: Secondary | ICD-10-CM | POA: Diagnosis not present

## 2015-04-16 LAB — GLUCOSE, CAPILLARY
GLUCOSE-CAPILLARY: 122 mg/dL — AB (ref 65–99)
GLUCOSE-CAPILLARY: 189 mg/dL — AB (ref 65–99)
Glucose-Capillary: 319 mg/dL — ABNORMAL HIGH (ref 65–99)
Glucose-Capillary: 183 mg/dL — ABNORMAL HIGH (ref 65–99)

## 2015-04-17 DIAGNOSIS — R0989 Other specified symptoms and signs involving the circulatory and respiratory systems: Secondary | ICD-10-CM | POA: Diagnosis not present

## 2015-04-17 DIAGNOSIS — E119 Type 2 diabetes mellitus without complications: Secondary | ICD-10-CM | POA: Diagnosis not present

## 2015-04-17 DIAGNOSIS — R05 Cough: Secondary | ICD-10-CM | POA: Diagnosis not present

## 2015-04-17 LAB — GLUCOSE, CAPILLARY
GLUCOSE-CAPILLARY: 127 mg/dL — AB (ref 65–99)
GLUCOSE-CAPILLARY: 216 mg/dL — AB (ref 65–99)
GLUCOSE-CAPILLARY: 117 mg/dL — AB (ref 65–99)
Glucose-Capillary: 310 mg/dL — ABNORMAL HIGH (ref 65–99)

## 2015-04-18 LAB — GLUCOSE, CAPILLARY
GLUCOSE-CAPILLARY: 186 mg/dL — AB (ref 65–99)
GLUCOSE-CAPILLARY: 125 mg/dL — AB (ref 65–99)
GLUCOSE-CAPILLARY: 333 mg/dL — AB (ref 65–99)
GLUCOSE-CAPILLARY: 230 mg/dL — AB (ref 65–99)
GLUCOSE-CAPILLARY: 145 mg/dL — AB (ref 65–99)
GLUCOSE-CAPILLARY: 250 mg/dL — AB (ref 65–99)
GLUCOSE-CAPILLARY: 161 mg/dL — AB (ref 65–99)
Glucose-Capillary: 324 mg/dL — ABNORMAL HIGH (ref 65–99)
Glucose-Capillary: 297 mg/dL — ABNORMAL HIGH (ref 65–99)
Glucose-Capillary: 199 mg/dL — ABNORMAL HIGH (ref 65–99)
Glucose-Capillary: 180 mg/dL — ABNORMAL HIGH (ref 65–99)
Glucose-Capillary: 319 mg/dL — ABNORMAL HIGH (ref 65–99)
Glucose-Capillary: 173 mg/dL — ABNORMAL HIGH (ref 65–99)
Glucose-Capillary: 375 mg/dL — ABNORMAL HIGH (ref 65–99)
Glucose-Capillary: 216 mg/dL — ABNORMAL HIGH (ref 65–99)

## 2015-04-19 DIAGNOSIS — R0989 Other specified symptoms and signs involving the circulatory and respiratory systems: Secondary | ICD-10-CM | POA: Diagnosis not present

## 2015-04-19 DIAGNOSIS — E119 Type 2 diabetes mellitus without complications: Secondary | ICD-10-CM | POA: Diagnosis not present

## 2015-04-19 DIAGNOSIS — R05 Cough: Secondary | ICD-10-CM | POA: Diagnosis not present

## 2015-04-19 LAB — GLUCOSE, CAPILLARY: Glucose-Capillary: 158 mg/dL — ABNORMAL HIGH (ref 65–99)

## 2015-04-23 ENCOUNTER — Encounter
Admission: RE | Admit: 2015-04-23 | Discharge: 2015-04-23 | Disposition: A | Discharge status: Home / Self Care | Payer: Medicare Other | Source: Ambulatory Visit | Attending: Internal Medicine | Admitting: Internal Medicine

## 2015-04-23 DIAGNOSIS — E119 Type 2 diabetes mellitus without complications: Secondary | ICD-10-CM | POA: Diagnosis not present

## 2015-04-23 DIAGNOSIS — E876 Hypokalemia: Secondary | ICD-10-CM | POA: Insufficient documentation

## 2015-04-24 DIAGNOSIS — E119 Type 2 diabetes mellitus without complications: Secondary | ICD-10-CM | POA: Diagnosis not present

## 2015-04-24 DIAGNOSIS — E876 Hypokalemia: Secondary | ICD-10-CM | POA: Diagnosis not present

## 2015-04-24 LAB — GLUCOSE, CAPILLARY: GLUCOSE-CAPILLARY: 187 mg/dL — AB (ref 65–99)

## 2015-05-01 DIAGNOSIS — E119 Type 2 diabetes mellitus without complications: Secondary | ICD-10-CM | POA: Diagnosis not present

## 2015-05-01 DIAGNOSIS — E876 Hypokalemia: Secondary | ICD-10-CM | POA: Diagnosis not present

## 2015-05-01 LAB — GLUCOSE, CAPILLARY: Glucose-Capillary: 198 mg/dL — ABNORMAL HIGH (ref 65–99)

## 2015-05-08 DIAGNOSIS — E876 Hypokalemia: Secondary | ICD-10-CM | POA: Diagnosis not present

## 2015-05-08 DIAGNOSIS — E119 Type 2 diabetes mellitus without complications: Secondary | ICD-10-CM | POA: Diagnosis not present

## 2015-05-08 LAB — GLUCOSE, CAPILLARY: GLUCOSE-CAPILLARY: 144 mg/dL — AB (ref 65–99)

## 2015-05-10 DIAGNOSIS — E876 Hypokalemia: Secondary | ICD-10-CM | POA: Diagnosis not present

## 2015-05-10 DIAGNOSIS — E119 Type 2 diabetes mellitus without complications: Secondary | ICD-10-CM | POA: Diagnosis not present

## 2015-05-10 LAB — BASIC METABOLIC PANEL
ANION GAP: 10 (ref 5–15)
BUN: 23 mg/dL — ABNORMAL HIGH (ref 6–20)
CALCIUM: 9.4 mg/dL (ref 8.9–10.3)
CO2: 26 mmol/L (ref 22–32)
Chloride: 105 mmol/L (ref 101–111)
Creatinine, Ser: 0.95 mg/dL (ref 0.44–1.00)
GFR, EST NON AFRICAN AMERICAN: 55 mL/min — AB (ref >60)
Glucose, Bld: 179 mg/dL — ABNORMAL HIGH (ref 65–99)
POTASSIUM: 3.6 mmol/L (ref 3.5–5.1)
Sodium: 141 mmol/L (ref 135–145)

## 2015-05-10 LAB — HEMOGLOBIN A1C: Hgb A1c MFr Bld: 9.2 % — ABNORMAL HIGH (ref 4.0–6.0)

## 2015-05-10 LAB — GLUCOSE, CAPILLARY: Glucose-Capillary: 179 mg/dL — ABNORMAL HIGH (ref 65–99)

## 2015-05-11 DIAGNOSIS — E876 Hypokalemia: Secondary | ICD-10-CM | POA: Diagnosis not present

## 2015-05-11 DIAGNOSIS — E119 Type 2 diabetes mellitus without complications: Secondary | ICD-10-CM | POA: Diagnosis not present

## 2015-05-11 LAB — GLUCOSE, CAPILLARY: GLUCOSE-CAPILLARY: 186 mg/dL — AB (ref 65–99)

## 2015-05-12 DIAGNOSIS — E119 Type 2 diabetes mellitus without complications: Secondary | ICD-10-CM | POA: Diagnosis not present

## 2015-05-12 DIAGNOSIS — E876 Hypokalemia: Secondary | ICD-10-CM | POA: Diagnosis not present

## 2015-05-12 LAB — GLUCOSE, CAPILLARY: Glucose-Capillary: 153 mg/dL — ABNORMAL HIGH (ref 65–99)

## 2015-05-13 DIAGNOSIS — E119 Type 2 diabetes mellitus without complications: Secondary | ICD-10-CM | POA: Diagnosis not present

## 2015-05-13 DIAGNOSIS — E876 Hypokalemia: Secondary | ICD-10-CM | POA: Diagnosis not present

## 2015-05-13 LAB — GLUCOSE, CAPILLARY: Glucose-Capillary: 108 mg/dL — ABNORMAL HIGH (ref 65–99)

## 2015-05-14 DIAGNOSIS — E876 Hypokalemia: Secondary | ICD-10-CM | POA: Diagnosis not present

## 2015-05-14 DIAGNOSIS — E119 Type 2 diabetes mellitus without complications: Secondary | ICD-10-CM | POA: Diagnosis not present

## 2015-05-14 LAB — GLUCOSE, CAPILLARY: GLUCOSE-CAPILLARY: 144 mg/dL — AB (ref 65–99)

## 2015-05-15 DIAGNOSIS — E876 Hypokalemia: Secondary | ICD-10-CM | POA: Diagnosis not present

## 2015-05-15 DIAGNOSIS — E119 Type 2 diabetes mellitus without complications: Secondary | ICD-10-CM | POA: Diagnosis not present

## 2015-05-15 LAB — GLUCOSE, CAPILLARY: GLUCOSE-CAPILLARY: 142 mg/dL — AB (ref 65–99)

## 2015-05-16 DIAGNOSIS — E876 Hypokalemia: Secondary | ICD-10-CM | POA: Diagnosis not present

## 2015-05-16 DIAGNOSIS — E119 Type 2 diabetes mellitus without complications: Secondary | ICD-10-CM | POA: Diagnosis not present

## 2015-05-16 LAB — GLUCOSE, CAPILLARY: Glucose-Capillary: 139 mg/dL — ABNORMAL HIGH (ref 65–99)

## 2015-05-17 DIAGNOSIS — E119 Type 2 diabetes mellitus without complications: Secondary | ICD-10-CM | POA: Diagnosis not present

## 2015-05-17 DIAGNOSIS — E876 Hypokalemia: Secondary | ICD-10-CM | POA: Diagnosis not present

## 2015-05-17 LAB — GLUCOSE, CAPILLARY: Glucose-Capillary: 121 mg/dL — ABNORMAL HIGH (ref 65–99)

## 2015-05-18 DIAGNOSIS — E876 Hypokalemia: Secondary | ICD-10-CM | POA: Diagnosis not present

## 2015-05-18 DIAGNOSIS — E119 Type 2 diabetes mellitus without complications: Secondary | ICD-10-CM | POA: Diagnosis not present

## 2015-05-18 LAB — GLUCOSE, CAPILLARY: GLUCOSE-CAPILLARY: 99 mg/dL (ref 65–99)

## 2015-05-19 DIAGNOSIS — E876 Hypokalemia: Secondary | ICD-10-CM | POA: Diagnosis not present

## 2015-05-19 DIAGNOSIS — E119 Type 2 diabetes mellitus without complications: Secondary | ICD-10-CM | POA: Diagnosis not present

## 2015-05-19 LAB — GLUCOSE, CAPILLARY: GLUCOSE-CAPILLARY: 113 mg/dL — AB (ref 65–99)

## 2015-05-20 DIAGNOSIS — E119 Type 2 diabetes mellitus without complications: Secondary | ICD-10-CM | POA: Diagnosis not present

## 2015-05-20 DIAGNOSIS — E876 Hypokalemia: Secondary | ICD-10-CM | POA: Diagnosis not present

## 2015-05-20 LAB — GLUCOSE, CAPILLARY: Glucose-Capillary: 126 mg/dL — ABNORMAL HIGH (ref 65–99)

## 2015-05-22 DIAGNOSIS — E119 Type 2 diabetes mellitus without complications: Secondary | ICD-10-CM | POA: Diagnosis not present

## 2015-05-22 DIAGNOSIS — E876 Hypokalemia: Secondary | ICD-10-CM | POA: Diagnosis not present

## 2015-05-22 LAB — GLUCOSE, CAPILLARY: Glucose-Capillary: 107 mg/dL — ABNORMAL HIGH (ref 65–99)

## 2015-05-23 ENCOUNTER — Encounter
Admission: RE | Admit: 2015-05-23 | Discharge: 2015-05-23 | Disposition: A | Discharge status: Home / Self Care | Payer: Medicare Other | Source: Ambulatory Visit | Attending: Internal Medicine | Admitting: Internal Medicine

## 2015-05-23 DIAGNOSIS — E119 Type 2 diabetes mellitus without complications: Secondary | ICD-10-CM | POA: Insufficient documentation

## 2015-05-23 LAB — GLUCOSE, CAPILLARY: GLUCOSE-CAPILLARY: 147 mg/dL — AB (ref 65–99)

## 2015-05-25 DIAGNOSIS — J449 Chronic obstructive pulmonary disease, unspecified: Secondary | ICD-10-CM | POA: Diagnosis not present

## 2015-05-25 DIAGNOSIS — E119 Type 2 diabetes mellitus without complications: Secondary | ICD-10-CM | POA: Diagnosis not present

## 2015-05-26 DIAGNOSIS — F0390 Unspecified dementia without behavioral disturbance: Secondary | ICD-10-CM | POA: Diagnosis not present

## 2015-05-26 DIAGNOSIS — R41841 Cognitive communication deficit: Secondary | ICD-10-CM | POA: Diagnosis not present

## 2015-05-26 DIAGNOSIS — Z9181 History of falling: Secondary | ICD-10-CM | POA: Diagnosis not present

## 2015-05-27 DIAGNOSIS — Z9181 History of falling: Secondary | ICD-10-CM | POA: Diagnosis not present

## 2015-05-27 DIAGNOSIS — F0390 Unspecified dementia without behavioral disturbance: Secondary | ICD-10-CM | POA: Diagnosis not present

## 2015-05-27 DIAGNOSIS — R41841 Cognitive communication deficit: Secondary | ICD-10-CM | POA: Diagnosis not present

## 2015-05-30 DIAGNOSIS — R41841 Cognitive communication deficit: Secondary | ICD-10-CM | POA: Diagnosis not present

## 2015-05-30 DIAGNOSIS — F0390 Unspecified dementia without behavioral disturbance: Secondary | ICD-10-CM | POA: Diagnosis not present

## 2015-05-30 DIAGNOSIS — Z9181 History of falling: Secondary | ICD-10-CM | POA: Diagnosis not present

## 2015-05-31 DIAGNOSIS — F0390 Unspecified dementia without behavioral disturbance: Secondary | ICD-10-CM | POA: Diagnosis not present

## 2015-05-31 DIAGNOSIS — R41841 Cognitive communication deficit: Secondary | ICD-10-CM | POA: Diagnosis not present

## 2015-05-31 DIAGNOSIS — Z9181 History of falling: Secondary | ICD-10-CM | POA: Diagnosis not present

## 2015-06-01 DIAGNOSIS — Z9181 History of falling: Secondary | ICD-10-CM | POA: Diagnosis not present

## 2015-06-01 DIAGNOSIS — R41841 Cognitive communication deficit: Secondary | ICD-10-CM | POA: Diagnosis not present

## 2015-06-01 DIAGNOSIS — F0390 Unspecified dementia without behavioral disturbance: Secondary | ICD-10-CM | POA: Diagnosis not present

## 2015-06-02 DIAGNOSIS — Z9181 History of falling: Secondary | ICD-10-CM | POA: Diagnosis not present

## 2015-06-02 DIAGNOSIS — F0390 Unspecified dementia without behavioral disturbance: Secondary | ICD-10-CM | POA: Diagnosis not present

## 2015-06-02 DIAGNOSIS — R41841 Cognitive communication deficit: Secondary | ICD-10-CM | POA: Diagnosis not present

## 2015-06-03 DIAGNOSIS — F0390 Unspecified dementia without behavioral disturbance: Secondary | ICD-10-CM | POA: Diagnosis not present

## 2015-06-03 DIAGNOSIS — R41841 Cognitive communication deficit: Secondary | ICD-10-CM | POA: Diagnosis not present

## 2015-06-03 DIAGNOSIS — Z9181 History of falling: Secondary | ICD-10-CM | POA: Diagnosis not present

## 2015-06-03 DIAGNOSIS — E119 Type 2 diabetes mellitus without complications: Secondary | ICD-10-CM | POA: Diagnosis not present

## 2015-06-03 LAB — GLUCOSE, CAPILLARY
GLUCOSE-CAPILLARY: 113 mg/dL — AB (ref 65–99)
Glucose-Capillary: 134 mg/dL — ABNORMAL HIGH (ref 65–99)
Glucose-Capillary: 154 mg/dL — ABNORMAL HIGH (ref 65–99)
Glucose-Capillary: 115 mg/dL — ABNORMAL HIGH (ref 65–99)

## 2015-06-06 DIAGNOSIS — F0390 Unspecified dementia without behavioral disturbance: Secondary | ICD-10-CM | POA: Diagnosis not present

## 2015-06-06 DIAGNOSIS — R41841 Cognitive communication deficit: Secondary | ICD-10-CM | POA: Diagnosis not present

## 2015-06-06 DIAGNOSIS — Z9181 History of falling: Secondary | ICD-10-CM | POA: Diagnosis not present

## 2015-06-07 DIAGNOSIS — F0390 Unspecified dementia without behavioral disturbance: Secondary | ICD-10-CM | POA: Diagnosis not present

## 2015-06-07 DIAGNOSIS — Z9181 History of falling: Secondary | ICD-10-CM | POA: Diagnosis not present

## 2015-06-07 DIAGNOSIS — R41841 Cognitive communication deficit: Secondary | ICD-10-CM | POA: Diagnosis not present

## 2015-06-08 DIAGNOSIS — R41841 Cognitive communication deficit: Secondary | ICD-10-CM | POA: Diagnosis not present

## 2015-06-08 DIAGNOSIS — F0390 Unspecified dementia without behavioral disturbance: Secondary | ICD-10-CM | POA: Diagnosis not present

## 2015-06-08 DIAGNOSIS — Z9181 History of falling: Secondary | ICD-10-CM | POA: Diagnosis not present

## 2015-06-09 DIAGNOSIS — Z9181 History of falling: Secondary | ICD-10-CM | POA: Diagnosis not present

## 2015-06-09 DIAGNOSIS — F0390 Unspecified dementia without behavioral disturbance: Secondary | ICD-10-CM | POA: Diagnosis not present

## 2015-06-09 DIAGNOSIS — R41841 Cognitive communication deficit: Secondary | ICD-10-CM | POA: Diagnosis not present

## 2015-06-10 DIAGNOSIS — Z9181 History of falling: Secondary | ICD-10-CM | POA: Diagnosis not present

## 2015-06-10 DIAGNOSIS — F0390 Unspecified dementia without behavioral disturbance: Secondary | ICD-10-CM | POA: Diagnosis not present

## 2015-06-10 DIAGNOSIS — R41841 Cognitive communication deficit: Secondary | ICD-10-CM | POA: Diagnosis not present

## 2015-06-13 DIAGNOSIS — R41841 Cognitive communication deficit: Secondary | ICD-10-CM | POA: Diagnosis not present

## 2015-06-13 DIAGNOSIS — Z9181 History of falling: Secondary | ICD-10-CM | POA: Diagnosis not present

## 2015-06-13 DIAGNOSIS — F0390 Unspecified dementia without behavioral disturbance: Secondary | ICD-10-CM | POA: Diagnosis not present

## 2015-06-14 DIAGNOSIS — Z9181 History of falling: Secondary | ICD-10-CM | POA: Diagnosis not present

## 2015-06-14 DIAGNOSIS — R41841 Cognitive communication deficit: Secondary | ICD-10-CM | POA: Diagnosis not present

## 2015-06-14 DIAGNOSIS — E119 Type 2 diabetes mellitus without complications: Secondary | ICD-10-CM | POA: Diagnosis not present

## 2015-06-14 DIAGNOSIS — F0390 Unspecified dementia without behavioral disturbance: Secondary | ICD-10-CM | POA: Diagnosis not present

## 2015-06-14 LAB — GLUCOSE, CAPILLARY: GLUCOSE-CAPILLARY: 97 mg/dL (ref 65–99)

## 2015-06-15 DIAGNOSIS — R41841 Cognitive communication deficit: Secondary | ICD-10-CM | POA: Diagnosis not present

## 2015-06-15 DIAGNOSIS — Z9181 History of falling: Secondary | ICD-10-CM | POA: Diagnosis not present

## 2015-06-15 DIAGNOSIS — F0390 Unspecified dementia without behavioral disturbance: Secondary | ICD-10-CM | POA: Diagnosis not present

## 2015-06-15 DIAGNOSIS — E119 Type 2 diabetes mellitus without complications: Secondary | ICD-10-CM | POA: Diagnosis not present

## 2015-06-15 LAB — GLUCOSE, CAPILLARY: GLUCOSE-CAPILLARY: 111 mg/dL — AB (ref 65–99)

## 2015-06-16 DIAGNOSIS — F0390 Unspecified dementia without behavioral disturbance: Secondary | ICD-10-CM | POA: Diagnosis not present

## 2015-06-16 DIAGNOSIS — Z9181 History of falling: Secondary | ICD-10-CM | POA: Diagnosis not present

## 2015-06-16 DIAGNOSIS — R41841 Cognitive communication deficit: Secondary | ICD-10-CM | POA: Diagnosis not present

## 2015-06-17 DIAGNOSIS — E119 Type 2 diabetes mellitus without complications: Secondary | ICD-10-CM | POA: Diagnosis not present

## 2015-06-17 DIAGNOSIS — R41841 Cognitive communication deficit: Secondary | ICD-10-CM | POA: Diagnosis not present

## 2015-06-17 DIAGNOSIS — F0390 Unspecified dementia without behavioral disturbance: Secondary | ICD-10-CM | POA: Diagnosis not present

## 2015-06-17 DIAGNOSIS — Z9181 History of falling: Secondary | ICD-10-CM | POA: Diagnosis not present

## 2015-06-17 LAB — GLUCOSE, CAPILLARY: Glucose-Capillary: 94 mg/dL (ref 65–99)

## 2015-06-18 DIAGNOSIS — E119 Type 2 diabetes mellitus without complications: Secondary | ICD-10-CM | POA: Diagnosis not present

## 2015-06-18 LAB — GLUCOSE, CAPILLARY: Glucose-Capillary: 146 mg/dL — ABNORMAL HIGH (ref 65–99)

## 2015-06-19 DIAGNOSIS — E119 Type 2 diabetes mellitus without complications: Secondary | ICD-10-CM | POA: Diagnosis not present

## 2015-06-19 LAB — GLUCOSE, CAPILLARY
Glucose-Capillary: 101 mg/dL — ABNORMAL HIGH (ref 65–99)
Glucose-Capillary: 120 mg/dL — ABNORMAL HIGH (ref 65–99)

## 2015-06-20 DIAGNOSIS — F0390 Unspecified dementia without behavioral disturbance: Secondary | ICD-10-CM | POA: Diagnosis not present

## 2015-06-20 DIAGNOSIS — E119 Type 2 diabetes mellitus without complications: Secondary | ICD-10-CM | POA: Diagnosis not present

## 2015-06-20 DIAGNOSIS — Z9181 History of falling: Secondary | ICD-10-CM | POA: Diagnosis not present

## 2015-06-20 DIAGNOSIS — R41841 Cognitive communication deficit: Secondary | ICD-10-CM | POA: Diagnosis not present

## 2015-06-20 LAB — GLUCOSE, CAPILLARY: GLUCOSE-CAPILLARY: 112 mg/dL — AB (ref 65–99)

## 2015-06-21 DIAGNOSIS — Z9181 History of falling: Secondary | ICD-10-CM | POA: Diagnosis not present

## 2015-06-21 DIAGNOSIS — F0390 Unspecified dementia without behavioral disturbance: Secondary | ICD-10-CM | POA: Diagnosis not present

## 2015-06-21 DIAGNOSIS — R41841 Cognitive communication deficit: Secondary | ICD-10-CM | POA: Diagnosis not present

## 2015-06-21 DIAGNOSIS — E119 Type 2 diabetes mellitus without complications: Secondary | ICD-10-CM | POA: Diagnosis not present

## 2015-06-21 LAB — GLUCOSE, CAPILLARY: GLUCOSE-CAPILLARY: 71 mg/dL (ref 65–99)

## 2015-06-22 DIAGNOSIS — E119 Type 2 diabetes mellitus without complications: Secondary | ICD-10-CM | POA: Diagnosis not present

## 2015-06-22 DIAGNOSIS — Z9181 History of falling: Secondary | ICD-10-CM | POA: Diagnosis not present

## 2015-06-22 DIAGNOSIS — R41841 Cognitive communication deficit: Secondary | ICD-10-CM | POA: Diagnosis not present

## 2015-06-22 DIAGNOSIS — F0390 Unspecified dementia without behavioral disturbance: Secondary | ICD-10-CM | POA: Diagnosis not present

## 2015-06-22 LAB — GLUCOSE, CAPILLARY: Glucose-Capillary: 142 mg/dL — ABNORMAL HIGH (ref 65–99)

## 2015-06-23 ENCOUNTER — Encounter
Admission: RE | Admit: 2015-06-23 | Discharge: 2015-06-23 | Disposition: A | Discharge status: Home / Self Care | Payer: Medicare Other | Source: Ambulatory Visit | Attending: Internal Medicine | Admitting: Internal Medicine

## 2015-06-23 DIAGNOSIS — R2689 Other abnormalities of gait and mobility: Secondary | ICD-10-CM | POA: Diagnosis not present

## 2015-06-23 DIAGNOSIS — R41841 Cognitive communication deficit: Secondary | ICD-10-CM | POA: Diagnosis not present

## 2015-06-23 DIAGNOSIS — E119 Type 2 diabetes mellitus without complications: Secondary | ICD-10-CM | POA: Insufficient documentation

## 2015-06-23 DIAGNOSIS — Z9181 History of falling: Secondary | ICD-10-CM | POA: Diagnosis not present

## 2015-06-23 DIAGNOSIS — M6281 Muscle weakness (generalized): Secondary | ICD-10-CM | POA: Diagnosis not present

## 2015-06-23 DIAGNOSIS — F0390 Unspecified dementia without behavioral disturbance: Secondary | ICD-10-CM | POA: Diagnosis not present

## 2015-06-24 DIAGNOSIS — R41841 Cognitive communication deficit: Secondary | ICD-10-CM | POA: Diagnosis not present

## 2015-06-24 DIAGNOSIS — F0390 Unspecified dementia without behavioral disturbance: Secondary | ICD-10-CM | POA: Diagnosis not present

## 2015-06-24 DIAGNOSIS — Z9181 History of falling: Secondary | ICD-10-CM | POA: Diagnosis not present

## 2015-06-24 DIAGNOSIS — E119 Type 2 diabetes mellitus without complications: Secondary | ICD-10-CM | POA: Diagnosis not present

## 2015-06-24 DIAGNOSIS — R2689 Other abnormalities of gait and mobility: Secondary | ICD-10-CM | POA: Diagnosis not present

## 2015-06-24 DIAGNOSIS — M6281 Muscle weakness (generalized): Secondary | ICD-10-CM | POA: Diagnosis not present

## 2015-06-24 LAB — GLUCOSE, CAPILLARY: GLUCOSE-CAPILLARY: 116 mg/dL — AB (ref 65–99)

## 2015-06-25 DIAGNOSIS — E119 Type 2 diabetes mellitus without complications: Secondary | ICD-10-CM | POA: Diagnosis not present

## 2015-06-25 LAB — GLUCOSE, CAPILLARY: Glucose-Capillary: 94 mg/dL (ref 65–99)

## 2015-06-26 DIAGNOSIS — E119 Type 2 diabetes mellitus without complications: Secondary | ICD-10-CM | POA: Diagnosis not present

## 2015-06-26 LAB — GLUCOSE, CAPILLARY: Glucose-Capillary: 115 mg/dL — ABNORMAL HIGH (ref 65–99)

## 2015-06-27 DIAGNOSIS — R41841 Cognitive communication deficit: Secondary | ICD-10-CM | POA: Diagnosis not present

## 2015-06-27 DIAGNOSIS — Z9181 History of falling: Secondary | ICD-10-CM | POA: Diagnosis not present

## 2015-06-27 DIAGNOSIS — E119 Type 2 diabetes mellitus without complications: Secondary | ICD-10-CM | POA: Diagnosis not present

## 2015-06-27 DIAGNOSIS — M6281 Muscle weakness (generalized): Secondary | ICD-10-CM | POA: Diagnosis not present

## 2015-06-27 DIAGNOSIS — F0390 Unspecified dementia without behavioral disturbance: Secondary | ICD-10-CM | POA: Diagnosis not present

## 2015-06-27 DIAGNOSIS — R2689 Other abnormalities of gait and mobility: Secondary | ICD-10-CM | POA: Diagnosis not present

## 2015-06-27 LAB — GLUCOSE, CAPILLARY: GLUCOSE-CAPILLARY: 158 mg/dL — AB (ref 65–99)

## 2015-06-28 DIAGNOSIS — E119 Type 2 diabetes mellitus without complications: Secondary | ICD-10-CM | POA: Diagnosis not present

## 2015-06-28 DIAGNOSIS — F0390 Unspecified dementia without behavioral disturbance: Secondary | ICD-10-CM | POA: Diagnosis not present

## 2015-06-28 DIAGNOSIS — R2689 Other abnormalities of gait and mobility: Secondary | ICD-10-CM | POA: Diagnosis not present

## 2015-06-28 DIAGNOSIS — M6281 Muscle weakness (generalized): Secondary | ICD-10-CM | POA: Diagnosis not present

## 2015-06-28 DIAGNOSIS — Z9181 History of falling: Secondary | ICD-10-CM | POA: Diagnosis not present

## 2015-06-28 DIAGNOSIS — R41841 Cognitive communication deficit: Secondary | ICD-10-CM | POA: Diagnosis not present

## 2015-06-28 LAB — GLUCOSE, CAPILLARY: Glucose-Capillary: 140 mg/dL — ABNORMAL HIGH (ref 65–99)

## 2015-06-29 DIAGNOSIS — E119 Type 2 diabetes mellitus without complications: Secondary | ICD-10-CM | POA: Diagnosis not present

## 2015-06-29 DIAGNOSIS — F0390 Unspecified dementia without behavioral disturbance: Secondary | ICD-10-CM | POA: Diagnosis not present

## 2015-06-29 DIAGNOSIS — M6281 Muscle weakness (generalized): Secondary | ICD-10-CM | POA: Diagnosis not present

## 2015-06-29 DIAGNOSIS — R41841 Cognitive communication deficit: Secondary | ICD-10-CM | POA: Diagnosis not present

## 2015-06-29 DIAGNOSIS — Z9181 History of falling: Secondary | ICD-10-CM | POA: Diagnosis not present

## 2015-06-29 DIAGNOSIS — R2689 Other abnormalities of gait and mobility: Secondary | ICD-10-CM | POA: Diagnosis not present

## 2015-06-29 LAB — GLUCOSE, CAPILLARY: GLUCOSE-CAPILLARY: 112 mg/dL — AB (ref 65–99)

## 2015-06-30 DIAGNOSIS — R2689 Other abnormalities of gait and mobility: Secondary | ICD-10-CM | POA: Diagnosis not present

## 2015-06-30 DIAGNOSIS — R41841 Cognitive communication deficit: Secondary | ICD-10-CM | POA: Diagnosis not present

## 2015-06-30 DIAGNOSIS — Z9181 History of falling: Secondary | ICD-10-CM | POA: Diagnosis not present

## 2015-06-30 DIAGNOSIS — M6281 Muscle weakness (generalized): Secondary | ICD-10-CM | POA: Diagnosis not present

## 2015-06-30 DIAGNOSIS — E119 Type 2 diabetes mellitus without complications: Secondary | ICD-10-CM | POA: Diagnosis not present

## 2015-06-30 DIAGNOSIS — F0390 Unspecified dementia without behavioral disturbance: Secondary | ICD-10-CM | POA: Diagnosis not present

## 2015-06-30 LAB — GLUCOSE, CAPILLARY: Glucose-Capillary: 155 mg/dL — ABNORMAL HIGH (ref 65–99)

## 2015-07-01 DIAGNOSIS — R2689 Other abnormalities of gait and mobility: Secondary | ICD-10-CM | POA: Diagnosis not present

## 2015-07-01 DIAGNOSIS — R41841 Cognitive communication deficit: Secondary | ICD-10-CM | POA: Diagnosis not present

## 2015-07-01 DIAGNOSIS — Z9181 History of falling: Secondary | ICD-10-CM | POA: Diagnosis not present

## 2015-07-01 DIAGNOSIS — M6281 Muscle weakness (generalized): Secondary | ICD-10-CM | POA: Diagnosis not present

## 2015-07-01 DIAGNOSIS — F0390 Unspecified dementia without behavioral disturbance: Secondary | ICD-10-CM | POA: Diagnosis not present

## 2015-07-01 DIAGNOSIS — E119 Type 2 diabetes mellitus without complications: Secondary | ICD-10-CM | POA: Diagnosis not present

## 2015-07-01 LAB — GLUCOSE, CAPILLARY: GLUCOSE-CAPILLARY: 107 mg/dL — AB (ref 65–99)

## 2015-07-02 DIAGNOSIS — M6281 Muscle weakness (generalized): Secondary | ICD-10-CM | POA: Diagnosis not present

## 2015-07-02 DIAGNOSIS — E119 Type 2 diabetes mellitus without complications: Secondary | ICD-10-CM | POA: Diagnosis not present

## 2015-07-02 DIAGNOSIS — R2689 Other abnormalities of gait and mobility: Secondary | ICD-10-CM | POA: Diagnosis not present

## 2015-07-02 DIAGNOSIS — R41841 Cognitive communication deficit: Secondary | ICD-10-CM | POA: Diagnosis not present

## 2015-07-02 DIAGNOSIS — F0390 Unspecified dementia without behavioral disturbance: Secondary | ICD-10-CM | POA: Diagnosis not present

## 2015-07-02 DIAGNOSIS — Z9181 History of falling: Secondary | ICD-10-CM | POA: Diagnosis not present

## 2015-07-02 LAB — GLUCOSE, CAPILLARY: Glucose-Capillary: 169 mg/dL — ABNORMAL HIGH (ref 65–99)

## 2015-07-03 DIAGNOSIS — E119 Type 2 diabetes mellitus without complications: Secondary | ICD-10-CM | POA: Diagnosis not present

## 2015-07-03 LAB — GLUCOSE, CAPILLARY: GLUCOSE-CAPILLARY: 96 mg/dL (ref 65–99)

## 2015-07-04 DIAGNOSIS — R41841 Cognitive communication deficit: Secondary | ICD-10-CM | POA: Diagnosis not present

## 2015-07-04 DIAGNOSIS — E119 Type 2 diabetes mellitus without complications: Secondary | ICD-10-CM | POA: Diagnosis not present

## 2015-07-04 DIAGNOSIS — Z9181 History of falling: Secondary | ICD-10-CM | POA: Diagnosis not present

## 2015-07-04 DIAGNOSIS — R2689 Other abnormalities of gait and mobility: Secondary | ICD-10-CM | POA: Diagnosis not present

## 2015-07-04 DIAGNOSIS — M6281 Muscle weakness (generalized): Secondary | ICD-10-CM | POA: Diagnosis not present

## 2015-07-04 DIAGNOSIS — F0390 Unspecified dementia without behavioral disturbance: Secondary | ICD-10-CM | POA: Diagnosis not present

## 2015-07-04 LAB — GLUCOSE, CAPILLARY: GLUCOSE-CAPILLARY: 159 mg/dL — AB (ref 65–99)

## 2015-07-05 DIAGNOSIS — M6281 Muscle weakness (generalized): Secondary | ICD-10-CM | POA: Diagnosis not present

## 2015-07-05 DIAGNOSIS — R2689 Other abnormalities of gait and mobility: Secondary | ICD-10-CM | POA: Diagnosis not present

## 2015-07-05 DIAGNOSIS — E119 Type 2 diabetes mellitus without complications: Secondary | ICD-10-CM | POA: Diagnosis not present

## 2015-07-05 DIAGNOSIS — R41841 Cognitive communication deficit: Secondary | ICD-10-CM | POA: Diagnosis not present

## 2015-07-05 DIAGNOSIS — Z9181 History of falling: Secondary | ICD-10-CM | POA: Diagnosis not present

## 2015-07-05 DIAGNOSIS — F0390 Unspecified dementia without behavioral disturbance: Secondary | ICD-10-CM | POA: Diagnosis not present

## 2015-07-05 LAB — GLUCOSE, CAPILLARY: Glucose-Capillary: 149 mg/dL — ABNORMAL HIGH (ref 65–99)

## 2015-07-06 DIAGNOSIS — E119 Type 2 diabetes mellitus without complications: Secondary | ICD-10-CM | POA: Diagnosis not present

## 2015-07-06 DIAGNOSIS — M6281 Muscle weakness (generalized): Secondary | ICD-10-CM | POA: Diagnosis not present

## 2015-07-06 DIAGNOSIS — F0390 Unspecified dementia without behavioral disturbance: Secondary | ICD-10-CM | POA: Diagnosis not present

## 2015-07-06 DIAGNOSIS — Z9181 History of falling: Secondary | ICD-10-CM | POA: Diagnosis not present

## 2015-07-06 DIAGNOSIS — R2689 Other abnormalities of gait and mobility: Secondary | ICD-10-CM | POA: Diagnosis not present

## 2015-07-06 DIAGNOSIS — R41841 Cognitive communication deficit: Secondary | ICD-10-CM | POA: Diagnosis not present

## 2015-07-06 LAB — GLUCOSE, CAPILLARY: GLUCOSE-CAPILLARY: 153 mg/dL — AB (ref 65–99)

## 2015-07-07 DIAGNOSIS — F0390 Unspecified dementia without behavioral disturbance: Secondary | ICD-10-CM | POA: Diagnosis not present

## 2015-07-07 DIAGNOSIS — E119 Type 2 diabetes mellitus without complications: Secondary | ICD-10-CM | POA: Diagnosis not present

## 2015-07-07 DIAGNOSIS — R41841 Cognitive communication deficit: Secondary | ICD-10-CM | POA: Diagnosis not present

## 2015-07-07 DIAGNOSIS — R2689 Other abnormalities of gait and mobility: Secondary | ICD-10-CM | POA: Diagnosis not present

## 2015-07-07 DIAGNOSIS — M6281 Muscle weakness (generalized): Secondary | ICD-10-CM | POA: Diagnosis not present

## 2015-07-07 DIAGNOSIS — Z9181 History of falling: Secondary | ICD-10-CM | POA: Diagnosis not present

## 2015-07-07 LAB — GLUCOSE, CAPILLARY: Glucose-Capillary: 81 mg/dL (ref 65–99)

## 2015-07-08 DIAGNOSIS — M6281 Muscle weakness (generalized): Secondary | ICD-10-CM | POA: Diagnosis not present

## 2015-07-08 DIAGNOSIS — E119 Type 2 diabetes mellitus without complications: Secondary | ICD-10-CM | POA: Diagnosis not present

## 2015-07-08 DIAGNOSIS — R41841 Cognitive communication deficit: Secondary | ICD-10-CM | POA: Diagnosis not present

## 2015-07-08 DIAGNOSIS — F0390 Unspecified dementia without behavioral disturbance: Secondary | ICD-10-CM | POA: Diagnosis not present

## 2015-07-08 DIAGNOSIS — Z9181 History of falling: Secondary | ICD-10-CM | POA: Diagnosis not present

## 2015-07-08 DIAGNOSIS — R2689 Other abnormalities of gait and mobility: Secondary | ICD-10-CM | POA: Diagnosis not present

## 2015-07-08 LAB — GLUCOSE, CAPILLARY: GLUCOSE-CAPILLARY: 128 mg/dL — AB (ref 65–99)

## 2015-07-09 DIAGNOSIS — E119 Type 2 diabetes mellitus without complications: Secondary | ICD-10-CM | POA: Diagnosis not present

## 2015-07-09 LAB — GLUCOSE, CAPILLARY: GLUCOSE-CAPILLARY: 87 mg/dL (ref 65–99)

## 2015-07-10 DIAGNOSIS — E119 Type 2 diabetes mellitus without complications: Secondary | ICD-10-CM | POA: Diagnosis not present

## 2015-07-10 LAB — GLUCOSE, CAPILLARY
Glucose-Capillary: 89 mg/dL (ref 65–99)
Glucose-Capillary: 242 mg/dL — ABNORMAL HIGH (ref 65–99)

## 2015-07-11 DIAGNOSIS — R41841 Cognitive communication deficit: Secondary | ICD-10-CM | POA: Diagnosis not present

## 2015-07-11 DIAGNOSIS — R2689 Other abnormalities of gait and mobility: Secondary | ICD-10-CM | POA: Diagnosis not present

## 2015-07-11 DIAGNOSIS — E119 Type 2 diabetes mellitus without complications: Secondary | ICD-10-CM | POA: Diagnosis not present

## 2015-07-11 DIAGNOSIS — F0390 Unspecified dementia without behavioral disturbance: Secondary | ICD-10-CM | POA: Diagnosis not present

## 2015-07-11 DIAGNOSIS — Z9181 History of falling: Secondary | ICD-10-CM | POA: Diagnosis not present

## 2015-07-11 DIAGNOSIS — M6281 Muscle weakness (generalized): Secondary | ICD-10-CM | POA: Diagnosis not present

## 2015-07-11 LAB — GLUCOSE, CAPILLARY
GLUCOSE-CAPILLARY: 177 mg/dL — AB (ref 65–99)
Glucose-Capillary: 156 mg/dL — ABNORMAL HIGH (ref 65–99)

## 2015-07-12 DIAGNOSIS — E119 Type 2 diabetes mellitus without complications: Secondary | ICD-10-CM | POA: Diagnosis not present

## 2015-07-12 LAB — GLUCOSE, CAPILLARY: Glucose-Capillary: 104 mg/dL — ABNORMAL HIGH (ref 65–99)

## 2015-07-13 DIAGNOSIS — E119 Type 2 diabetes mellitus without complications: Secondary | ICD-10-CM | POA: Diagnosis not present

## 2015-07-13 LAB — GLUCOSE, CAPILLARY: GLUCOSE-CAPILLARY: 89 mg/dL (ref 65–99)

## 2015-07-14 DIAGNOSIS — R41841 Cognitive communication deficit: Secondary | ICD-10-CM | POA: Diagnosis not present

## 2015-07-14 DIAGNOSIS — M6281 Muscle weakness (generalized): Secondary | ICD-10-CM | POA: Diagnosis not present

## 2015-07-14 DIAGNOSIS — Z9181 History of falling: Secondary | ICD-10-CM | POA: Diagnosis not present

## 2015-07-14 DIAGNOSIS — R2689 Other abnormalities of gait and mobility: Secondary | ICD-10-CM | POA: Diagnosis not present

## 2015-07-14 DIAGNOSIS — F0390 Unspecified dementia without behavioral disturbance: Secondary | ICD-10-CM | POA: Diagnosis not present

## 2015-07-14 DIAGNOSIS — E119 Type 2 diabetes mellitus without complications: Secondary | ICD-10-CM | POA: Diagnosis not present

## 2015-07-14 LAB — GLUCOSE, CAPILLARY: Glucose-Capillary: 157 mg/dL — ABNORMAL HIGH (ref 65–99)

## 2015-07-15 DIAGNOSIS — R41841 Cognitive communication deficit: Secondary | ICD-10-CM | POA: Diagnosis not present

## 2015-07-15 DIAGNOSIS — M6281 Muscle weakness (generalized): Secondary | ICD-10-CM | POA: Diagnosis not present

## 2015-07-15 DIAGNOSIS — E119 Type 2 diabetes mellitus without complications: Secondary | ICD-10-CM | POA: Diagnosis not present

## 2015-07-15 DIAGNOSIS — F0390 Unspecified dementia without behavioral disturbance: Secondary | ICD-10-CM | POA: Diagnosis not present

## 2015-07-15 DIAGNOSIS — Z9181 History of falling: Secondary | ICD-10-CM | POA: Diagnosis not present

## 2015-07-15 DIAGNOSIS — R2689 Other abnormalities of gait and mobility: Secondary | ICD-10-CM | POA: Diagnosis not present

## 2015-07-15 LAB — GLUCOSE, CAPILLARY: GLUCOSE-CAPILLARY: 106 mg/dL — AB (ref 65–99)

## 2015-07-16 DIAGNOSIS — E119 Type 2 diabetes mellitus without complications: Secondary | ICD-10-CM | POA: Diagnosis not present

## 2015-07-16 LAB — GLUCOSE, CAPILLARY: Glucose-Capillary: 124 mg/dL — ABNORMAL HIGH (ref 65–99)

## 2015-07-17 DIAGNOSIS — E119 Type 2 diabetes mellitus without complications: Secondary | ICD-10-CM | POA: Diagnosis not present

## 2015-07-17 LAB — GLUCOSE, CAPILLARY: GLUCOSE-CAPILLARY: 97 mg/dL (ref 65–99)

## 2015-07-18 DIAGNOSIS — Z9181 History of falling: Secondary | ICD-10-CM | POA: Diagnosis not present

## 2015-07-18 DIAGNOSIS — M6281 Muscle weakness (generalized): Secondary | ICD-10-CM | POA: Diagnosis not present

## 2015-07-18 DIAGNOSIS — R2689 Other abnormalities of gait and mobility: Secondary | ICD-10-CM | POA: Diagnosis not present

## 2015-07-18 DIAGNOSIS — E119 Type 2 diabetes mellitus without complications: Secondary | ICD-10-CM | POA: Diagnosis not present

## 2015-07-18 DIAGNOSIS — F0390 Unspecified dementia without behavioral disturbance: Secondary | ICD-10-CM | POA: Diagnosis not present

## 2015-07-18 DIAGNOSIS — R41841 Cognitive communication deficit: Secondary | ICD-10-CM | POA: Diagnosis not present

## 2015-07-18 LAB — GLUCOSE, CAPILLARY: GLUCOSE-CAPILLARY: 119 mg/dL — AB (ref 65–99)

## 2015-07-19 DIAGNOSIS — E119 Type 2 diabetes mellitus without complications: Secondary | ICD-10-CM | POA: Diagnosis not present

## 2015-07-19 LAB — GLUCOSE, CAPILLARY: GLUCOSE-CAPILLARY: 102 mg/dL — AB (ref 65–99)

## 2015-07-20 DIAGNOSIS — E119 Type 2 diabetes mellitus without complications: Secondary | ICD-10-CM | POA: Diagnosis not present

## 2015-07-20 LAB — GLUCOSE, CAPILLARY: GLUCOSE-CAPILLARY: 112 mg/dL — AB (ref 65–99)

## 2015-07-21 DIAGNOSIS — E119 Type 2 diabetes mellitus without complications: Secondary | ICD-10-CM | POA: Diagnosis not present

## 2015-07-21 LAB — GLUCOSE, CAPILLARY: Glucose-Capillary: 161 mg/dL — ABNORMAL HIGH (ref 65–99)

## 2015-07-22 DIAGNOSIS — E119 Type 2 diabetes mellitus without complications: Secondary | ICD-10-CM | POA: Diagnosis not present

## 2015-07-22 LAB — GLUCOSE, CAPILLARY
GLUCOSE-CAPILLARY: 134 mg/dL — AB (ref 65–99)
Glucose-Capillary: 244 mg/dL — ABNORMAL HIGH (ref 65–99)
Glucose-Capillary: 195 mg/dL — ABNORMAL HIGH (ref 65–99)

## 2015-07-23 ENCOUNTER — Encounter
Admission: RE | Admit: 2015-07-23 | Discharge: 2015-07-23 | Disposition: A | Discharge status: Home / Self Care | Payer: Medicare Other | Source: Ambulatory Visit | Attending: Internal Medicine | Admitting: Internal Medicine

## 2015-07-23 DIAGNOSIS — E119 Type 2 diabetes mellitus without complications: Secondary | ICD-10-CM | POA: Insufficient documentation

## 2015-07-23 LAB — GLUCOSE, CAPILLARY: GLUCOSE-CAPILLARY: 155 mg/dL — AB (ref 65–99)

## 2015-08-11 ENCOUNTER — Non-Acute Institutional Stay (SKILLED_NURSING_FACILITY): Payer: Medicare Other | Admitting: Gerontology

## 2015-08-11 DIAGNOSIS — E119 Type 2 diabetes mellitus without complications: Secondary | ICD-10-CM | POA: Diagnosis not present

## 2015-08-11 DIAGNOSIS — J449 Chronic obstructive pulmonary disease, unspecified: Secondary | ICD-10-CM

## 2015-08-11 DIAGNOSIS — M810 Age-related osteoporosis without current pathological fracture: Secondary | ICD-10-CM | POA: Diagnosis not present

## 2015-08-12 DIAGNOSIS — J449 Chronic obstructive pulmonary disease, unspecified: Secondary | ICD-10-CM | POA: Insufficient documentation

## 2015-08-12 NOTE — Progress Notes (Signed)
Location:  [facility] Service:  SNF [phone]) Provider:  Toni Arthurs, NP-C  Kirk Ruths., MD  Patient Care Team: Kirk Ruths, MD as PCP - General (Internal Medicine) Einar Pheasant, MD (Internal Medicine)  Extended Emergency Contact Information Primary Emergency Contact: Duwayne Heck Address: [address]          [place], [place] [address] [name] of [place] Phone: [phone] Relation: None Secondary Emergency Contact: [name] Address: [address]          [place], [place] [address] Home Phone: [phone] Relation: None  Code Status:  Full Goals of care: Advanced Directive information Advanced Directives 02/05/2015  Does patient have an advance directive? Yes  Type of Advance Directive [place]  Does patient want to make changes to advanced directive? No - Patient declined  Copy of advanced directive(s) in chart? Yes     Chief Complaint  Patient presents with  . Medical Management of Chronic Issues    HPI:  Pt is a 82 y.o. female seen today for medical management of chronic diseases. She has a current diagnosis of COPD, unspecified. Pt denies any dyspnea/ SOB, chest pain. No recent exacerbations. Denies pain. Pt reports she is doing well overall.       Past Medical History  Diagnosis Date  . COPD (chronic obstructive pulmonary disease) ([place])   . Lung cancer ([place])     squamous cell s/p left upper lobe llingulectomy 12/08/04 - Dr Arlyce Dice  . Bladder cancer ([place]) 2003    s/p resection and chemotherapy, followed by Dr Jacqlyn Larsen  . Hypercholesterolemia   . Atrial fibrillation ([place])     post op, converted to NSR wtih amiodarone  . Hypothyroidism   . Inner ear disease   . Diabetes mellitus ([place])   . Diabetes mellitus without complication ([place])   . Hypertension   . Dementia   . Anemia   . GERD (gastroesophageal reflux disease)   . Hyperlipemia    Past Surgical History  Procedure Laterality Date    . Lung cancer surgery  12/08/04    s/p left upper lobe lingulectomy - Dr Arlyce Dice  . Bladder surgery  2003    for bladder cancer - Dr Jacqlyn Larsen  . Cholecystectomy  1986    Allergies  Allergen Reactions  . Lovenox [Enoxaparin Sodium]   . No Known Drug Allergy   . Novolog [Insulin Aspart]       Medication List       This list is accurate as of: 08/11/15 11:59 PM.  Always use your most recent med list.               acetaminophen 650 MG CR tablet  Commonly known as:  TYLENOL  Take 650 mg by mouth every 12 (twelve) hours.     acetaminophen 325 MG tablet  Commonly known as:  TYLENOL  Take 650 mg by mouth every 4 (four) hours as needed for moderate pain or fever.     albuterol (2.5 MG/3ML) 0.083% nebulizer solution  Commonly known as:  PROVENTIL  Take 3 mLs (2.5 mg total) by nebulization every 6 (six) hours.     aspirin 81 MG tablet  Take 81 mg by mouth daily.     bisacodyl 10 MG suppository  Commonly known as:  DULCOLAX  Place 10 mg rectally daily as needed for mild constipation or moderate constipation. *If Milk of Magnesia does not resolve constipation.*     budesonide  0.25 MG/2ML nebulizer solution  Commonly known as:  PULMICORT  Take 2 mLs (0.25 mg total) by nebulization 2 (two) times daily.     calcium-vitamin D 500-200 MG-UNIT tablet  Commonly known as:  OSCAL WITH D  Take 1 tablet by mouth 2 (two) times daily.     camphor-menthol lotion  Commonly known as:  SARNA  Apply 1 application topically as needed for itching.     cetirizine 5 MG tablet  Commonly known as:  ZYRTEC  Take 5 mg by mouth at bedtime as needed for allergies.     cholecalciferol 1000 units tablet  Commonly known as:  VITAMIN D  Take 1,000 Units by mouth daily.     docusate sodium 100 MG capsule  Commonly known as:  COLACE  Take 100 mg by mouth 2 (two) times daily as needed for mild constipation or moderate constipation.     docusate sodium 100 MG capsule  Commonly known as:  COLACE   TAKE ONE CAPSULE BY MOUTH TWICE A DAY     Fluticasone-Salmeterol 250-50 MCG/DOSE Aepb  Commonly known as:  ADVAIR DISKUS  Inhale 1 puff into the lungs 2 (two) times daily.     folic acid 989 MCG tablet  Commonly known as:  FOLVITE  Take 400 mcg by mouth daily.     glimepiride 2 MG tablet  Commonly known as:  AMARYL  Take 3 mg by mouth 2 (two) times daily.     glimepiride 2 MG tablet  Commonly known as:  AMARYL  Take 2 mg by mouth daily.     guaifenesin 100 MG/5ML syrup  Commonly known as:  ROBITUSSIN  Take 200 mg by mouth every 4 (four) hours as needed for cough. *notify MD if symptoms continue more than 48 hours hours.     HYDROcodone-acetaminophen 5-325 MG tablet  Commonly known as:  NORCO/VICODIN  Take 1 tablet by mouth every 4 (four) hours as needed for moderate pain or severe pain.     Insulin Pen Needle 32G X 4 MM Misc  Commonly known as:  CLICKFINE PEN NEEDLES  Novolog FlexPen Needles-use twice a day     insulin regular 100 units/mL injection  Commonly known as:  NOVOLIN R,HUMULIN R  Inject into the skin 3 (three) times daily before meals. Sliding scale.     loperamide 2 MG tablet  Commonly known as:  IMODIUM A-D  Take 2-4 mg by mouth See admin instructions. Take 2 tablets ('4mg'$ ) by mouth for initial loose stool, then take 1 tablet by mouth after each subsequent loose stool as needed up to 8 doses in 24 hours.     losartan 50 MG tablet  Commonly known as:  COZAAR  Take 50 mg by mouth daily.     losartan 100 MG tablet  Commonly known as:  COZAAR  Take 100 mg by mouth daily.     lovastatin 20 MG tablet  Commonly known as:  MEVACOR  Take 20 mg by mouth at bedtime.     magnesium hydroxide 400 MG/5ML suspension  Commonly known as:  MILK OF MAGNESIA  Take 30 mLs by mouth daily as needed for mild constipation. *no results in 24 hours may administer bisacodyl suppository*     metFORMIN 500 MG tablet  Commonly known as:  GLUCOPHAGE  Take 500 mg by mouth 2 (two)  times daily with a meal.     metoprolol succinate 25 MG 24 hr tablet  Commonly known as:  TOPROL-XL  Take  25 mg by mouth daily.     metoprolol succinate 50 MG 24 hr tablet  Commonly known as:  TOPROL-XL  Take 1 tablet (50 mg total) by mouth daily. Take with or immediately following a meal.     montelukast 10 MG tablet  Commonly known as:  SINGULAIR  Take 10 mg by mouth at bedtime.     nystatin cream  Commonly known as:  MYCOSTATIN  Apply topically 2 (two) times daily.     nystatin powder  Commonly known as:  MYCOSTATIN/NYSTOP  Apply topically 2 (two) times daily.     omeprazole 20 MG capsule  Commonly known as:  PRILOSEC  Take 20 mg by mouth daily.     rOPINIRole 0.25 MG tablet  Commonly known as:  REQUIP  Take 1 tablet (0.25 mg total) by mouth at bedtime.     senna 8.6 MG Tabs tablet  Commonly known as:  SENOKOT  Take 8.6 mg by mouth 2 (two) times daily.     tiotropium 18 MCG inhalation capsule  Commonly known as:  SPIRIVA  Place 18 mcg into inhaler and inhale daily.     tiotropium 18 MCG inhalation capsule  Commonly known as:  SPIRIVA  Place 1 capsule (18 mcg total) into inhaler and inhale daily.     traMADol 50 MG tablet  Commonly known as:  ULTRAM  Take 50 mg by mouth every 6 (six) hours as needed for moderate pain or severe pain.     triamcinolone cream 0.1 %  Commonly known as:  KENALOG  APPLY TOPICALLY TWICE A DAY     trolamine salicylate 10 % cream  Commonly known as:  ASPERCREME  Apply 1 application topically 4 (four) times daily as needed for muscle pain. Apply and massage on the right hip/outer thigh.     vitamin B-12 250 MCG tablet  Commonly known as:  CYANOCOBALAMIN  Take 250 mcg by mouth daily.        Review of Systems  Constitutional: Negative.   HENT: Negative for congestion, postnasal drip, rhinorrhea, sinus pressure, trouble swallowing and voice change.   Eyes: Negative.   Respiratory: Negative for cough, choking, chest tightness,  shortness of breath and wheezing.   Cardiovascular: Negative for chest pain and leg swelling.  Musculoskeletal: Negative.   Skin: Negative.   Neurological: Negative.   Psychiatric/Behavioral: Negative.   All other systems reviewed and are negative.   Immunization History  Administered Date(s) Administered  . Influenza Split 12/05/2011   Pertinent  Health Maintenance Due  Topic Date Due  . FOOT EXAM  12/05/1943  . OPHTHALMOLOGY EXAM  12/05/1943  . DEXA SCAN  12/05/2000  . PNA vac Low Risk Adult (1 of 2 - PCV13) 12/05/2000  . INFLUENZA VACCINE  08/23/2015  . HEMOGLOBIN A1C  11/09/2015   No flowsheet data found. Functional Status Survey:    Filed Vitals:   08/11/15 1727  BP: 130/55  Pulse: 64  Temp: 97.1 F (36.2 C)  Resp: 18  SpO2: 94%   There is no weight on file to calculate BMI. Physical Exam  Constitutional: She is oriented to person, place, and time. She appears well-developed and well-nourished. No distress.  HENT:  Head: Normocephalic and atraumatic.  Mouth/Throat: Oropharynx is clear and moist.  Eyes: Conjunctivae and EOM are normal. Pupils are equal, round, and reactive to light.  Neck: Normal range of motion. Neck supple. No JVD present.  Cardiovascular: Normal rate, regular rhythm, normal heart sounds and intact distal pulses.  Exam reveals no gallop and no friction rub.   No murmur heard. Pulmonary/Chest: Effort normal and breath sounds normal. No respiratory distress. She has no wheezes. She has no rales. She exhibits no tenderness.  Abdominal: Soft. Bowel sounds are normal. She exhibits no distension. There is no tenderness. There is no guarding.  Lymphadenopathy:    She has no cervical adenopathy.  Neurological: She is alert and oriented to person, place, and time.  Skin: Skin is warm and dry. No rash noted. She is not diaphoretic. No erythema. No pallor.  Psychiatric: She has a normal mood and affect. Her behavior is normal. Judgment and thought  content normal.  Nursing note and vitals reviewed.   Labs reviewed:  Recent Labs  01/11/15 0903  02/06/15 0445 04/01/15 0455 05/10/15 0917  NA  --   < > 137 138 141  K  --   < > 3.9 3.4* 3.6  CL  --   < > 100* 105 105  CO2  --   < > '31 26 26  '$ GLUCOSE  --   < > 243* 163* 179*  BUN  --   < > 26* 19 23*  CREATININE  --   < > 0.97 0.76 0.95  CALCIUM  --   < > 8.7* 8.7* 9.4  MG 1.7  --   --   --   --   < > = values in this interval not displayed.  Recent Labs  10/29/14 1330 04/01/15 0455  AST 16 16  ALT 15 12*  ALKPHOS 57 48  BILITOT 0.2* 0.5  PROT 6.3* 5.8*  ALBUMIN 3.8 3.3*    Recent Labs  10/29/14 1330 02/05/15 1203 02/06/15 0445 04/01/15 0455  WBC 8.3 15.3* 9.6 7.3  NEUTROABS 4.4 9.9*  --  2.9  HGB 14.6 15.2 14.0 13.0  HCT 43.9 47.1* 42.4 39.3  MCV 90.5 89.9 90.1 87.6  PLT 227 305 234 250   Lab Results  Component Value Date   TSH 1.451 04/01/2015   Lab Results  Component Value Date   HGBA1C 9.2* 05/10/2015   Lab Results  Component Value Date   CHOL 158 11/17/2013   HDL 37* 11/17/2013   LDLCALC 71 11/17/2013   TRIG 248* 11/17/2013    Significant Diagnostic Results in last 30 days:  No results found.  Assessment/Plan 1. Chronic obstructive pulmonary disease, unspecified COPD type ([place])  Continue current regimen of care and medication scheduled. Symptoms appear to be well controlled. No recent exacerbations.   Family/ staff Communication:   Total Time: 30 minutes  Documentation: 15 minutes  Face to Face: 15 minutes  Family/Phone:   Labs/tests ordered:  none   Vikki Ports, NP-C [facility] [address], [place] [address] Cell Phone (Mon-Fri 8am-5pm):  [phone] On Call:  [phone] & follow prompts after 5pm & weekends Office Phone:  [phone] Office Fax:  [phone]

## 2015-08-14 DIAGNOSIS — E119 Type 2 diabetes mellitus without complications: Secondary | ICD-10-CM | POA: Diagnosis not present

## 2015-08-14 LAB — GLUCOSE, CAPILLARY: Glucose-Capillary: 117 mg/dL — ABNORMAL HIGH (ref 65–99)

## 2015-08-15 DIAGNOSIS — E119 Type 2 diabetes mellitus without complications: Secondary | ICD-10-CM | POA: Diagnosis not present

## 2015-08-15 LAB — GLUCOSE, CAPILLARY: GLUCOSE-CAPILLARY: 100 mg/dL — AB (ref 65–99)

## 2015-08-16 DIAGNOSIS — E119 Type 2 diabetes mellitus without complications: Secondary | ICD-10-CM | POA: Diagnosis not present

## 2015-08-16 LAB — GLUCOSE, CAPILLARY: GLUCOSE-CAPILLARY: 126 mg/dL — AB (ref 65–99)

## 2015-08-17 DIAGNOSIS — E119 Type 2 diabetes mellitus without complications: Secondary | ICD-10-CM | POA: Diagnosis not present

## 2015-08-17 LAB — GLUCOSE, CAPILLARY: GLUCOSE-CAPILLARY: 121 mg/dL — AB (ref 65–99)

## 2015-08-18 DIAGNOSIS — E119 Type 2 diabetes mellitus without complications: Secondary | ICD-10-CM | POA: Diagnosis not present

## 2015-08-18 LAB — GLUCOSE, CAPILLARY: Glucose-Capillary: 141 mg/dL — ABNORMAL HIGH (ref 65–99)

## 2015-08-19 DIAGNOSIS — E119 Type 2 diabetes mellitus without complications: Secondary | ICD-10-CM | POA: Diagnosis not present

## 2015-08-19 LAB — GLUCOSE, CAPILLARY: GLUCOSE-CAPILLARY: 106 mg/dL — AB (ref 65–99)

## 2015-08-20 DIAGNOSIS — E119 Type 2 diabetes mellitus without complications: Secondary | ICD-10-CM | POA: Diagnosis not present

## 2015-08-20 LAB — GLUCOSE, CAPILLARY: Glucose-Capillary: 147 mg/dL — ABNORMAL HIGH (ref 65–99)

## 2015-08-21 DIAGNOSIS — E119 Type 2 diabetes mellitus without complications: Secondary | ICD-10-CM | POA: Diagnosis not present

## 2015-08-21 LAB — GLUCOSE, CAPILLARY: GLUCOSE-CAPILLARY: 112 mg/dL — AB (ref 65–99)

## 2015-08-22 DIAGNOSIS — E119 Type 2 diabetes mellitus without complications: Secondary | ICD-10-CM | POA: Diagnosis not present

## 2015-08-22 LAB — GLUCOSE, CAPILLARY: GLUCOSE-CAPILLARY: 150 mg/dL — AB (ref 65–99)

## 2015-08-23 ENCOUNTER — Encounter
Admission: RE | Admit: 2015-08-23 | Discharge: 2015-08-23 | Disposition: A | Discharge status: Home / Self Care | Payer: Medicare Other | Source: Ambulatory Visit | Attending: Internal Medicine | Admitting: Internal Medicine

## 2015-08-23 DIAGNOSIS — R4182 Altered mental status, unspecified: Secondary | ICD-10-CM | POA: Insufficient documentation

## 2015-08-23 DIAGNOSIS — E119 Type 2 diabetes mellitus without complications: Secondary | ICD-10-CM | POA: Insufficient documentation

## 2015-08-24 DIAGNOSIS — E119 Type 2 diabetes mellitus without complications: Secondary | ICD-10-CM | POA: Diagnosis not present

## 2015-08-24 DIAGNOSIS — R4182 Altered mental status, unspecified: Secondary | ICD-10-CM | POA: Diagnosis not present

## 2015-08-24 LAB — GLUCOSE, CAPILLARY: GLUCOSE-CAPILLARY: 85 mg/dL (ref 65–99)

## 2015-08-25 DIAGNOSIS — E119 Type 2 diabetes mellitus without complications: Secondary | ICD-10-CM | POA: Diagnosis not present

## 2015-08-25 DIAGNOSIS — R4182 Altered mental status, unspecified: Secondary | ICD-10-CM | POA: Diagnosis not present

## 2015-08-25 LAB — GLUCOSE, CAPILLARY: Glucose-Capillary: 171 mg/dL — ABNORMAL HIGH (ref 65–99)

## 2015-08-26 DIAGNOSIS — E119 Type 2 diabetes mellitus without complications: Secondary | ICD-10-CM | POA: Diagnosis not present

## 2015-08-26 DIAGNOSIS — R4182 Altered mental status, unspecified: Secondary | ICD-10-CM | POA: Diagnosis not present

## 2015-08-26 LAB — GLUCOSE, CAPILLARY: Glucose-Capillary: 136 mg/dL — ABNORMAL HIGH (ref 65–99)

## 2015-08-27 DIAGNOSIS — E119 Type 2 diabetes mellitus without complications: Secondary | ICD-10-CM | POA: Diagnosis not present

## 2015-08-27 DIAGNOSIS — R4182 Altered mental status, unspecified: Secondary | ICD-10-CM | POA: Diagnosis not present

## 2015-08-27 LAB — GLUCOSE, CAPILLARY: GLUCOSE-CAPILLARY: 120 mg/dL — AB (ref 65–99)

## 2015-08-28 DIAGNOSIS — E119 Type 2 diabetes mellitus without complications: Secondary | ICD-10-CM | POA: Diagnosis not present

## 2015-08-28 DIAGNOSIS — R4182 Altered mental status, unspecified: Secondary | ICD-10-CM | POA: Diagnosis not present

## 2015-08-28 LAB — GLUCOSE, CAPILLARY: GLUCOSE-CAPILLARY: 123 mg/dL — AB (ref 65–99)

## 2015-08-29 DIAGNOSIS — E119 Type 2 diabetes mellitus without complications: Secondary | ICD-10-CM | POA: Diagnosis not present

## 2015-08-29 DIAGNOSIS — R4182 Altered mental status, unspecified: Secondary | ICD-10-CM | POA: Diagnosis not present

## 2015-08-29 LAB — GLUCOSE, CAPILLARY: GLUCOSE-CAPILLARY: 132 mg/dL — AB (ref 65–99)

## 2015-08-30 DIAGNOSIS — R4182 Altered mental status, unspecified: Secondary | ICD-10-CM | POA: Diagnosis not present

## 2015-08-30 DIAGNOSIS — E119 Type 2 diabetes mellitus without complications: Secondary | ICD-10-CM | POA: Diagnosis not present

## 2015-08-30 LAB — GLUCOSE, CAPILLARY: GLUCOSE-CAPILLARY: 115 mg/dL — AB (ref 65–99)

## 2015-08-31 DIAGNOSIS — R4182 Altered mental status, unspecified: Secondary | ICD-10-CM | POA: Diagnosis not present

## 2015-08-31 DIAGNOSIS — E119 Type 2 diabetes mellitus without complications: Secondary | ICD-10-CM | POA: Diagnosis not present

## 2015-08-31 LAB — GLUCOSE, CAPILLARY: GLUCOSE-CAPILLARY: 96 mg/dL (ref 65–99)

## 2015-09-01 DIAGNOSIS — E119 Type 2 diabetes mellitus without complications: Secondary | ICD-10-CM | POA: Diagnosis not present

## 2015-09-01 DIAGNOSIS — R4182 Altered mental status, unspecified: Secondary | ICD-10-CM | POA: Diagnosis not present

## 2015-09-01 LAB — GLUCOSE, CAPILLARY: Glucose-Capillary: 129 mg/dL — ABNORMAL HIGH (ref 65–99)

## 2015-09-02 DIAGNOSIS — E119 Type 2 diabetes mellitus without complications: Secondary | ICD-10-CM | POA: Diagnosis not present

## 2015-09-02 DIAGNOSIS — R4182 Altered mental status, unspecified: Secondary | ICD-10-CM | POA: Diagnosis not present

## 2015-09-02 LAB — GLUCOSE, CAPILLARY
Glucose-Capillary: 127 mg/dL — ABNORMAL HIGH (ref 65–99)
Glucose-Capillary: 146 mg/dL — ABNORMAL HIGH (ref 65–99)

## 2015-09-03 DIAGNOSIS — R4182 Altered mental status, unspecified: Secondary | ICD-10-CM | POA: Diagnosis not present

## 2015-09-03 DIAGNOSIS — E119 Type 2 diabetes mellitus without complications: Secondary | ICD-10-CM | POA: Diagnosis not present

## 2015-09-03 LAB — GLUCOSE, CAPILLARY: Glucose-Capillary: 137 mg/dL — ABNORMAL HIGH (ref 65–99)

## 2015-09-05 DIAGNOSIS — E119 Type 2 diabetes mellitus without complications: Secondary | ICD-10-CM | POA: Diagnosis not present

## 2015-09-05 DIAGNOSIS — R4182 Altered mental status, unspecified: Secondary | ICD-10-CM | POA: Diagnosis not present

## 2015-09-05 LAB — GLUCOSE, CAPILLARY: Glucose-Capillary: 146 mg/dL — ABNORMAL HIGH (ref 65–99)

## 2015-09-06 DIAGNOSIS — E119 Type 2 diabetes mellitus without complications: Secondary | ICD-10-CM | POA: Diagnosis not present

## 2015-09-06 DIAGNOSIS — R4182 Altered mental status, unspecified: Secondary | ICD-10-CM | POA: Diagnosis not present

## 2015-09-06 LAB — GLUCOSE, CAPILLARY: Glucose-Capillary: 115 mg/dL — ABNORMAL HIGH (ref 65–99)

## 2015-09-07 DIAGNOSIS — E119 Type 2 diabetes mellitus without complications: Secondary | ICD-10-CM | POA: Diagnosis not present

## 2015-09-07 DIAGNOSIS — R4182 Altered mental status, unspecified: Secondary | ICD-10-CM | POA: Diagnosis not present

## 2015-09-07 LAB — GLUCOSE, CAPILLARY: Glucose-Capillary: 133 mg/dL — ABNORMAL HIGH (ref 65–99)

## 2015-09-08 DIAGNOSIS — R4182 Altered mental status, unspecified: Secondary | ICD-10-CM | POA: Diagnosis not present

## 2015-09-08 DIAGNOSIS — E119 Type 2 diabetes mellitus without complications: Secondary | ICD-10-CM | POA: Diagnosis not present

## 2015-09-08 LAB — GLUCOSE, CAPILLARY: GLUCOSE-CAPILLARY: 134 mg/dL — AB (ref 65–99)

## 2015-09-09 DIAGNOSIS — R4182 Altered mental status, unspecified: Secondary | ICD-10-CM | POA: Diagnosis not present

## 2015-09-09 DIAGNOSIS — E119 Type 2 diabetes mellitus without complications: Secondary | ICD-10-CM | POA: Diagnosis not present

## 2015-09-09 LAB — GLUCOSE, CAPILLARY: Glucose-Capillary: 142 mg/dL — ABNORMAL HIGH (ref 65–99)

## 2015-09-10 DIAGNOSIS — R4182 Altered mental status, unspecified: Secondary | ICD-10-CM | POA: Diagnosis not present

## 2015-09-10 DIAGNOSIS — E119 Type 2 diabetes mellitus without complications: Secondary | ICD-10-CM | POA: Diagnosis not present

## 2015-09-10 LAB — GLUCOSE, CAPILLARY: GLUCOSE-CAPILLARY: 91 mg/dL (ref 65–99)

## 2015-09-11 DIAGNOSIS — R4182 Altered mental status, unspecified: Secondary | ICD-10-CM | POA: Diagnosis not present

## 2015-09-11 DIAGNOSIS — E119 Type 2 diabetes mellitus without complications: Secondary | ICD-10-CM | POA: Diagnosis not present

## 2015-09-11 LAB — URINALYSIS COMPLETE WITH MICROSCOPIC (ARMC ONLY)
BACTERIA UA: NONE SEEN
BILIRUBIN URINE: NEGATIVE
GLUCOSE, UA: NEGATIVE mg/dL
Hgb urine dipstick: NEGATIVE
Ketones, ur: NEGATIVE mg/dL
Leukocytes, UA: NEGATIVE
Nitrite: NEGATIVE
Protein, ur: NEGATIVE mg/dL
SQUAMOUS EPITHELIAL / LPF: NONE SEEN
Specific Gravity, Urine: 1.017 (ref 1.005–1.030)
pH: 8 (ref 5.0–8.0)

## 2015-09-11 LAB — GLUCOSE, CAPILLARY: GLUCOSE-CAPILLARY: 115 mg/dL — AB (ref 65–99)

## 2015-09-12 DIAGNOSIS — E119 Type 2 diabetes mellitus without complications: Secondary | ICD-10-CM | POA: Diagnosis not present

## 2015-09-12 DIAGNOSIS — R4182 Altered mental status, unspecified: Secondary | ICD-10-CM | POA: Diagnosis not present

## 2015-09-12 LAB — URINE CULTURE: CULTURE: NO GROWTH

## 2015-09-12 LAB — GLUCOSE, CAPILLARY: GLUCOSE-CAPILLARY: 101 mg/dL — AB (ref 65–99)

## 2015-09-13 DIAGNOSIS — R4182 Altered mental status, unspecified: Secondary | ICD-10-CM | POA: Diagnosis not present

## 2015-09-13 DIAGNOSIS — E119 Type 2 diabetes mellitus without complications: Secondary | ICD-10-CM | POA: Diagnosis not present

## 2015-09-13 LAB — GLUCOSE, CAPILLARY: Glucose-Capillary: 129 mg/dL — ABNORMAL HIGH (ref 65–99)

## 2015-09-14 DIAGNOSIS — R4182 Altered mental status, unspecified: Secondary | ICD-10-CM | POA: Diagnosis not present

## 2015-09-14 DIAGNOSIS — E119 Type 2 diabetes mellitus without complications: Secondary | ICD-10-CM | POA: Diagnosis not present

## 2015-09-14 LAB — GLUCOSE, CAPILLARY: GLUCOSE-CAPILLARY: 163 mg/dL — AB (ref 65–99)

## 2015-09-15 DIAGNOSIS — E119 Type 2 diabetes mellitus without complications: Secondary | ICD-10-CM | POA: Diagnosis not present

## 2015-09-15 DIAGNOSIS — R4182 Altered mental status, unspecified: Secondary | ICD-10-CM | POA: Diagnosis present

## 2015-09-15 LAB — URINALYSIS COMPLETE WITH MICROSCOPIC (ARMC ONLY)
Bilirubin Urine: NEGATIVE
Glucose, UA: 50 mg/dL — AB
KETONES UR: NEGATIVE mg/dL
Nitrite: NEGATIVE
PH: 5 (ref 5.0–8.0)
PROTEIN: NEGATIVE mg/dL
Specific Gravity, Urine: 1.021 (ref 1.005–1.030)

## 2015-09-15 LAB — GLUCOSE, CAPILLARY: Glucose-Capillary: 138 mg/dL — ABNORMAL HIGH (ref 65–99)

## 2015-09-17 DIAGNOSIS — R4182 Altered mental status, unspecified: Secondary | ICD-10-CM | POA: Diagnosis not present

## 2015-09-17 DIAGNOSIS — E119 Type 2 diabetes mellitus without complications: Secondary | ICD-10-CM | POA: Diagnosis not present

## 2015-09-17 LAB — GLUCOSE, CAPILLARY: Glucose-Capillary: 84 mg/dL (ref 65–99)

## 2015-09-18 DIAGNOSIS — E119 Type 2 diabetes mellitus without complications: Secondary | ICD-10-CM | POA: Diagnosis not present

## 2015-09-18 DIAGNOSIS — R4182 Altered mental status, unspecified: Secondary | ICD-10-CM | POA: Diagnosis not present

## 2015-09-18 LAB — URINE CULTURE: Culture: >=100000 — AB

## 2015-09-18 LAB — GLUCOSE, CAPILLARY: Glucose-Capillary: 144 mg/dL — ABNORMAL HIGH (ref 65–99)

## 2015-09-19 ENCOUNTER — Non-Acute Institutional Stay (SKILLED_NURSING_FACILITY): Payer: Medicare Other | Admitting: Gerontology

## 2015-09-19 DIAGNOSIS — E119 Type 2 diabetes mellitus without complications: Secondary | ICD-10-CM | POA: Diagnosis not present

## 2015-09-19 DIAGNOSIS — R4182 Altered mental status, unspecified: Secondary | ICD-10-CM | POA: Diagnosis not present

## 2015-09-19 DIAGNOSIS — N390 Urinary tract infection, site not specified: Secondary | ICD-10-CM | POA: Diagnosis not present

## 2015-09-19 LAB — GLUCOSE, CAPILLARY
GLUCOSE-CAPILLARY: 199 mg/dL — AB (ref 65–99)
GLUCOSE-CAPILLARY: 302 mg/dL — AB (ref 65–99)

## 2015-09-20 DIAGNOSIS — R4182 Altered mental status, unspecified: Secondary | ICD-10-CM | POA: Diagnosis not present

## 2015-09-20 DIAGNOSIS — E119 Type 2 diabetes mellitus without complications: Secondary | ICD-10-CM | POA: Diagnosis not present

## 2015-09-20 LAB — GLUCOSE, CAPILLARY: Glucose-Capillary: 144 mg/dL — ABNORMAL HIGH (ref 65–99)

## 2015-09-21 DIAGNOSIS — R4182 Altered mental status, unspecified: Secondary | ICD-10-CM | POA: Diagnosis not present

## 2015-09-21 DIAGNOSIS — E119 Type 2 diabetes mellitus without complications: Secondary | ICD-10-CM | POA: Diagnosis not present

## 2015-09-21 LAB — GLUCOSE, CAPILLARY: GLUCOSE-CAPILLARY: 95 mg/dL (ref 65–99)

## 2015-09-22 DIAGNOSIS — E119 Type 2 diabetes mellitus without complications: Secondary | ICD-10-CM | POA: Diagnosis not present

## 2015-09-22 DIAGNOSIS — R4182 Altered mental status, unspecified: Secondary | ICD-10-CM | POA: Diagnosis not present

## 2015-09-22 LAB — GLUCOSE, CAPILLARY: GLUCOSE-CAPILLARY: 90 mg/dL (ref 65–99)

## 2015-09-23 ENCOUNTER — Encounter
Admission: RE | Admit: 2015-09-23 | Discharge: 2015-09-23 | Disposition: A | Discharge status: Home / Self Care | Payer: Medicare Other | Source: Ambulatory Visit | Attending: Internal Medicine | Admitting: Internal Medicine

## 2015-09-23 DIAGNOSIS — E119 Type 2 diabetes mellitus without complications: Secondary | ICD-10-CM | POA: Insufficient documentation

## 2015-09-23 DIAGNOSIS — R410 Disorientation, unspecified: Secondary | ICD-10-CM | POA: Insufficient documentation

## 2015-09-24 DIAGNOSIS — E119 Type 2 diabetes mellitus without complications: Secondary | ICD-10-CM | POA: Diagnosis not present

## 2015-09-24 DIAGNOSIS — R410 Disorientation, unspecified: Secondary | ICD-10-CM | POA: Diagnosis not present

## 2015-09-24 LAB — GLUCOSE, CAPILLARY: GLUCOSE-CAPILLARY: 85 mg/dL (ref 65–99)

## 2015-09-24 NOTE — Progress Notes (Signed)
Location:      Place of Service:  SNF (31) Provider:  Toni Arthurs, NP-C  Kirk Ruths., MD  Patient Care Team: Kirk Ruths, MD as PCP - General (Internal Medicine) Einar Pheasant, MD (Internal Medicine)  Extended Emergency Contact Information Primary Emergency Contact: Duwayne Heck Address: [address]          [place], [place] [address] [name] of [place] Phone: [phone] Relation: None Secondary Emergency Contact: [name] Address: [address]          [place], [place] [address] Home Phone: [phone] Relation: None  Code Status:  dnr Goals of care: Advanced Directive information Advanced Directives 02/05/2015  Does patient have an advance directive? Yes  Type of Advance Directive [place]  Does patient want to make changes to advanced directive? No - Patient declined  Copy of advanced directive(s) in chart? Yes     Chief Complaint  Patient presents with  . Acute Visit    HPI:  Pt is a 82 y.o. female seen today for an acute visit for increased confusion. Staff and family have noticed pt has been having increased confusion, odd behaviors. This started 1-2 days ago. Nursing obtained a urine sample and sent it off for evaluation. No c/o n/v/d/f/c/cp/sob. No other c/o. VSS   Past Medical History:  Diagnosis Date  . Anemia   . Atrial fibrillation ([place])    post op, converted to NSR wtih amiodarone  . Bladder cancer ([place]) 2003   s/p resection and chemotherapy, followed by Dr Jacqlyn Larsen  . COPD (chronic obstructive pulmonary disease) ([place])   . Dementia   . Diabetes mellitus ([place])   . Diabetes mellitus without complication ([place])   . GERD (gastroesophageal reflux disease)   . Hypercholesterolemia   . Hyperlipemia   . Hypertension   . Hypothyroidism   . Inner ear disease   . Lung cancer ([place])    squamous cell s/p left upper lobe llingulectomy 12/08/04 - Dr Arlyce Dice   Past Surgical History:  Procedure Laterality Date    . BLADDER SURGERY  2003   for bladder cancer - Dr Jacqlyn Larsen  . CHOLECYSTECTOMY  1986  . LUNG CANCER SURGERY  12/08/04   s/p left upper lobe lingulectomy - Dr Arlyce Dice    Allergies  Allergen Reactions  . Lovenox [Enoxaparin Sodium]   . No Known Drug Allergy   . Novolog [Insulin Aspart]       Medication List       Accurate as of 09/19/15 11:59 PM. Always use your most recent med list.          acetaminophen 650 MG CR tablet Commonly known as:  TYLENOL Take 650 mg by mouth every 12 (twelve) hours.   acetaminophen 325 MG tablet Commonly known as:  TYLENOL Take 650 mg by mouth every 4 (four) hours as needed for moderate pain or fever.   albuterol (2.5 MG/3ML) 0.083% nebulizer solution Commonly known as:  PROVENTIL Take 3 mLs (2.5 mg total) by nebulization every 6 (six) hours.   aspirin 81 MG tablet Take 81 mg by mouth daily.   bisacodyl 10 MG suppository Commonly known as:  DULCOLAX Place 10 mg rectally daily as needed for mild constipation or moderate constipation. *If Milk of Magnesia does not resolve constipation.*   budesonide 0.25 MG/2ML nebulizer solution Commonly known as:  PULMICORT Take 2 mLs (0.25 mg total) by nebulization 2 (two) times daily.   calcium-vitamin D 500-200 MG-UNIT tablet Commonly known as:  OSCAL WITH D Take 1 tablet by mouth 2 (two) times daily.   camphor-menthol lotion Commonly known as:  SARNA Apply 1 application topically as needed for itching.   cetirizine 5 MG tablet Commonly known as:  ZYRTEC Take 5 mg by mouth at bedtime as needed for allergies.   cholecalciferol 1000 units tablet Commonly known as:  VITAMIN D Take 1,000 Units by mouth daily.   docusate sodium 100 MG capsule Commonly known as:  COLACE Take 100 mg by mouth 2 (two) times daily as needed for mild constipation or moderate constipation.   docusate sodium 100 MG capsule Commonly known as:  COLACE TAKE ONE CAPSULE BY MOUTH TWICE A DAY   Fluticasone-Salmeterol 250-50  MCG/DOSE Aepb Commonly known as:  ADVAIR DISKUS Inhale 1 puff into the lungs 2 (two) times daily.   folic acid 478 MCG tablet Commonly known as:  FOLVITE Take 400 mcg by mouth daily.   glimepiride 2 MG tablet Commonly known as:  AMARYL Take 3 mg by mouth 2 (two) times daily.   glimepiride 2 MG tablet Commonly known as:  AMARYL Take 2 mg by mouth daily.   guaifenesin 100 MG/5ML syrup Commonly known as:  ROBITUSSIN Take 200 mg by mouth every 4 (four) hours as needed for cough. *notify MD if symptoms continue more than 48 hours hours.   HYDROcodone-acetaminophen 5-325 MG tablet Commonly known as:  NORCO/VICODIN Take 1 tablet by mouth every 4 (four) hours as needed for moderate pain or severe pain.   Insulin Pen Needle 32G X 4 MM Misc Commonly known as:  CLICKFINE PEN NEEDLES Novolog FlexPen Needles-use twice a day   insulin regular 100 units/mL injection Commonly known as:  NOVOLIN R,HUMULIN R Inject into the skin 3 (three) times daily before meals. Sliding scale.   loperamide 2 MG tablet Commonly known as:  IMODIUM A-D Take 2-4 mg by mouth See admin instructions. Take 2 tablets ('4mg'$ ) by mouth for initial loose stool, then take 1 tablet by mouth after each subsequent loose stool as needed up to 8 doses in 24 hours.   losartan 50 MG tablet Commonly known as:  COZAAR Take 50 mg by mouth daily.   losartan 100 MG tablet Commonly known as:  COZAAR Take 100 mg by mouth daily.   lovastatin 20 MG tablet Commonly known as:  MEVACOR Take 20 mg by mouth at bedtime.   magnesium hydroxide 400 MG/5ML suspension Commonly known as:  MILK OF MAGNESIA Take 30 mLs by mouth daily as needed for mild constipation. *no results in 24 hours may administer bisacodyl suppository*   metFORMIN 500 MG tablet Commonly known as:  GLUCOPHAGE Take 500 mg by mouth 2 (two) times daily with a meal.   metoprolol succinate 25 MG 24 hr tablet Commonly known as:  TOPROL-XL Take 25 mg by mouth daily.     metoprolol succinate 50 MG 24 hr tablet Commonly known as:  TOPROL-XL Take 1 tablet (50 mg total) by mouth daily. Take with or immediately following a meal.   montelukast 10 MG tablet Commonly known as:  SINGULAIR Take 10 mg by mouth at bedtime.   nystatin cream Commonly known as:  MYCOSTATIN Apply topically 2 (two) times daily.   nystatin powder Commonly known as:  MYCOSTATIN/NYSTOP Apply topically 2 (two) times daily.   omeprazole 20 MG capsule Commonly known as:  PRILOSEC Take 20 mg by mouth daily.   rOPINIRole 0.25 MG tablet Commonly known as:  REQUIP Take 1 tablet (0.25 mg total)  by mouth at bedtime.   senna 8.6 MG Tabs tablet Commonly known as:  SENOKOT Take 8.6 mg by mouth 2 (two) times daily.   tiotropium 18 MCG inhalation capsule Commonly known as:  SPIRIVA Place 18 mcg into inhaler and inhale daily.   tiotropium 18 MCG inhalation capsule Commonly known as:  SPIRIVA Place 1 capsule (18 mcg total) into inhaler and inhale daily.   traMADol 50 MG tablet Commonly known as:  ULTRAM Take 50 mg by mouth every 6 (six) hours as needed for moderate pain or severe pain.   triamcinolone cream 0.1 % Commonly known as:  KENALOG APPLY TOPICALLY TWICE A DAY   trolamine salicylate 10 % cream Commonly known as:  ASPERCREME Apply 1 application topically 4 (four) times daily as needed for muscle pain. Apply and massage on the right hip/outer thigh.   vitamin B-12 250 MCG tablet Commonly known as:  CYANOCOBALAMIN Take 250 mcg by mouth daily.       Review of Systems  Constitutional: Negative.   HENT: Negative for congestion, postnasal drip, rhinorrhea, sinus pressure, trouble swallowing and voice change.   Eyes: Negative.   Respiratory: Negative for cough, choking, chest tightness, shortness of breath and wheezing.   Cardiovascular: Negative for chest pain and leg swelling.  Gastrointestinal: Negative.   Genitourinary: Positive for frequency. Negative for dysuria,  enuresis and urgency.  Musculoskeletal: Negative.   Skin: Negative.   Neurological: Negative.   Psychiatric/Behavioral: Positive for confusion and decreased concentration.  All other systems reviewed and are negative.   Immunization History  Administered Date(s) Administered  . Influenza Split 12/05/2011   Pertinent  Health Maintenance Due  Topic Date Due  . FOOT EXAM  12/05/1943  . OPHTHALMOLOGY EXAM  12/05/1943  . DEXA SCAN  12/05/2000  . PNA vac Low Risk Adult (1 of 2 - PCV13) 12/05/2000  . INFLUENZA VACCINE  08/23/2015  . HEMOGLOBIN A1C  11/09/2015   No flowsheet data found. Functional Status Survey:    Vitals:   09/07/15 0500  BP: 120/78  Pulse: 72  Resp: 16  Temp: 98.2 F (36.8 C)  SpO2: 98%   There is no height or weight on file to calculate BMI. Physical Exam  Constitutional: She is oriented to person, place, and time. She appears well-developed and well-nourished. No distress.  HENT:  Head: Normocephalic and atraumatic.  Mouth/Throat: Oropharynx is clear and moist.  Eyes: Conjunctivae and EOM are normal. Pupils are equal, round, and reactive to light.  Neck: Normal range of motion. Neck supple. No JVD present.  Cardiovascular: Normal rate, regular rhythm, normal heart sounds and intact distal pulses.  Exam reveals no gallop and no friction rub.   No murmur heard. Pulmonary/Chest: Effort normal and breath sounds normal. No respiratory distress. She has no wheezes. She has no rales. She exhibits no tenderness.  Abdominal: Soft. Bowel sounds are normal. She exhibits no distension. There is no tenderness. There is no guarding.  Lymphadenopathy:    She has no cervical adenopathy.  Neurological: She is alert and oriented to person, place, and time.  Skin: Skin is warm and dry. No rash noted. She is not diaphoretic. No erythema. No pallor.  Psychiatric: She has a normal mood and affect. Her behavior is normal. Judgment and thought content normal.  Nursing note and  vitals reviewed.   Labs reviewed:  Recent Labs  01/11/15 0903  02/06/15 0445 04/01/15 0455 05/10/15 0917  NA  --   < > 137 138 141  K  --   < >  3.9 3.4* 3.6  CL  --   < > 100* 105 105  CO2  --   < > '31 26 26  '$ GLUCOSE  --   < > 243* 163* 179*  BUN  --   < > 26* 19 23*  CREATININE  --   < > 0.97 0.76 0.95  CALCIUM  --   < > 8.7* 8.7* 9.4  MG 1.7  --   --   --   --   < > = values in this interval not displayed.  Recent Labs  10/29/14 1330 04/01/15 0455  AST 16 16  ALT 15 12*  ALKPHOS 57 48  BILITOT 0.2* 0.5  PROT 6.3* 5.8*  ALBUMIN 3.8 3.3*    Recent Labs  10/29/14 1330 02/05/15 1203 02/06/15 0445 04/01/15 0455  WBC 8.3 15.3* 9.6 7.3  NEUTROABS 4.4 9.9*  --  2.9  HGB 14.6 15.2 14.0 13.0  HCT 43.9 47.1* 42.4 39.3  MCV 90.5 89.9 90.1 87.6  PLT 227 305 234 250   Lab Results  Component Value Date   TSH 1.451 04/01/2015   Lab Results  Component Value Date   HGBA1C 9.2 (H) 05/10/2015   Lab Results  Component Value Date   CHOL 158 11/17/2013   HDL 37 (L) 11/17/2013   LDLCALC 71 11/17/2013   TRIG 248 (H) 11/17/2013    Significant Diagnostic Results in last 30 days:  No results found.  Assessment/Plan 1. Urinary tract infection, site not specified  Bactrim DS 1 tab PO Q 12 hours x 5 days  Family/ staff Communication:   Total Time:  Documentation:  Face to Face:  Family/Phone:   Labs/tests ordered:  UA, C&S  Medication list reviewed and assessed for continued appropriateness.  Vikki Ports, NP-C [facility] [phone], [place] [address] Cell Phone (Mon-Fri 8am-5pm):  [phone] On Call:  [phone] & follow prompts after 5pm & weekends Office Phone:  [phone] Office Fax:  [phone]

## 2015-09-25 DIAGNOSIS — R410 Disorientation, unspecified: Secondary | ICD-10-CM | POA: Diagnosis not present

## 2015-09-25 DIAGNOSIS — E119 Type 2 diabetes mellitus without complications: Secondary | ICD-10-CM | POA: Diagnosis not present

## 2015-09-25 LAB — GLUCOSE, CAPILLARY: GLUCOSE-CAPILLARY: 79 mg/dL (ref 65–99)

## 2015-09-26 DIAGNOSIS — R410 Disorientation, unspecified: Secondary | ICD-10-CM | POA: Diagnosis not present

## 2015-09-26 DIAGNOSIS — E119 Type 2 diabetes mellitus without complications: Secondary | ICD-10-CM | POA: Diagnosis not present

## 2015-09-26 LAB — GLUCOSE, CAPILLARY: GLUCOSE-CAPILLARY: 102 mg/dL — AB (ref 65–99)

## 2015-09-27 DIAGNOSIS — R410 Disorientation, unspecified: Secondary | ICD-10-CM | POA: Diagnosis not present

## 2015-09-27 DIAGNOSIS — E119 Type 2 diabetes mellitus without complications: Secondary | ICD-10-CM | POA: Diagnosis not present

## 2015-09-27 LAB — GLUCOSE, CAPILLARY: Glucose-Capillary: 151 mg/dL — ABNORMAL HIGH (ref 65–99)

## 2015-09-28 DIAGNOSIS — R410 Disorientation, unspecified: Secondary | ICD-10-CM | POA: Diagnosis not present

## 2015-09-28 DIAGNOSIS — E119 Type 2 diabetes mellitus without complications: Secondary | ICD-10-CM | POA: Diagnosis not present

## 2015-09-28 LAB — GLUCOSE, CAPILLARY: GLUCOSE-CAPILLARY: 127 mg/dL — AB (ref 65–99)

## 2015-09-29 DIAGNOSIS — E119 Type 2 diabetes mellitus without complications: Secondary | ICD-10-CM | POA: Diagnosis not present

## 2015-09-29 DIAGNOSIS — R410 Disorientation, unspecified: Secondary | ICD-10-CM | POA: Diagnosis not present

## 2015-09-29 LAB — GLUCOSE, CAPILLARY: GLUCOSE-CAPILLARY: 120 mg/dL — AB (ref 65–99)

## 2015-09-30 DIAGNOSIS — E119 Type 2 diabetes mellitus without complications: Secondary | ICD-10-CM | POA: Diagnosis not present

## 2015-09-30 DIAGNOSIS — R410 Disorientation, unspecified: Secondary | ICD-10-CM | POA: Diagnosis not present

## 2015-09-30 LAB — GLUCOSE, CAPILLARY: Glucose-Capillary: 119 mg/dL — ABNORMAL HIGH (ref 65–99)

## 2015-10-01 DIAGNOSIS — E119 Type 2 diabetes mellitus without complications: Secondary | ICD-10-CM | POA: Diagnosis not present

## 2015-10-01 DIAGNOSIS — R410 Disorientation, unspecified: Secondary | ICD-10-CM | POA: Diagnosis not present

## 2015-10-01 LAB — GLUCOSE, CAPILLARY: GLUCOSE-CAPILLARY: 125 mg/dL — AB (ref 65–99)

## 2015-10-02 DIAGNOSIS — E119 Type 2 diabetes mellitus without complications: Secondary | ICD-10-CM | POA: Diagnosis not present

## 2015-10-02 DIAGNOSIS — R410 Disorientation, unspecified: Secondary | ICD-10-CM | POA: Diagnosis not present

## 2015-10-02 LAB — GLUCOSE, CAPILLARY: Glucose-Capillary: 132 mg/dL — ABNORMAL HIGH (ref 65–99)

## 2015-10-03 DIAGNOSIS — E119 Type 2 diabetes mellitus without complications: Secondary | ICD-10-CM | POA: Diagnosis not present

## 2015-10-03 DIAGNOSIS — R410 Disorientation, unspecified: Secondary | ICD-10-CM | POA: Diagnosis not present

## 2015-10-03 LAB — GLUCOSE, CAPILLARY: GLUCOSE-CAPILLARY: 168 mg/dL — AB (ref 65–99)

## 2015-10-04 DIAGNOSIS — R410 Disorientation, unspecified: Secondary | ICD-10-CM | POA: Diagnosis not present

## 2015-10-04 DIAGNOSIS — E119 Type 2 diabetes mellitus without complications: Secondary | ICD-10-CM | POA: Diagnosis not present

## 2015-10-04 LAB — GLUCOSE, CAPILLARY: Glucose-Capillary: 138 mg/dL — ABNORMAL HIGH (ref 65–99)

## 2015-10-05 DIAGNOSIS — R410 Disorientation, unspecified: Secondary | ICD-10-CM | POA: Diagnosis not present

## 2015-10-05 DIAGNOSIS — E119 Type 2 diabetes mellitus without complications: Secondary | ICD-10-CM | POA: Diagnosis not present

## 2015-10-05 LAB — GLUCOSE, CAPILLARY: GLUCOSE-CAPILLARY: 156 mg/dL — AB (ref 65–99)

## 2015-10-06 DIAGNOSIS — R410 Disorientation, unspecified: Secondary | ICD-10-CM | POA: Diagnosis not present

## 2015-10-06 DIAGNOSIS — E119 Type 2 diabetes mellitus without complications: Secondary | ICD-10-CM | POA: Diagnosis not present

## 2015-10-06 LAB — GLUCOSE, CAPILLARY: Glucose-Capillary: 114 mg/dL — ABNORMAL HIGH (ref 65–99)

## 2015-10-07 DIAGNOSIS — E119 Type 2 diabetes mellitus without complications: Secondary | ICD-10-CM | POA: Diagnosis not present

## 2015-10-07 DIAGNOSIS — M6281 Muscle weakness (generalized): Secondary | ICD-10-CM | POA: Diagnosis not present

## 2015-10-07 DIAGNOSIS — R410 Disorientation, unspecified: Secondary | ICD-10-CM | POA: Diagnosis not present

## 2015-10-07 DIAGNOSIS — R262 Difficulty in walking, not elsewhere classified: Secondary | ICD-10-CM | POA: Diagnosis not present

## 2015-10-07 LAB — GLUCOSE, CAPILLARY: GLUCOSE-CAPILLARY: 87 mg/dL (ref 65–99)

## 2015-10-08 DIAGNOSIS — R410 Disorientation, unspecified: Secondary | ICD-10-CM | POA: Diagnosis not present

## 2015-10-08 DIAGNOSIS — E119 Type 2 diabetes mellitus without complications: Secondary | ICD-10-CM | POA: Diagnosis not present

## 2015-10-08 LAB — GLUCOSE, CAPILLARY: GLUCOSE-CAPILLARY: 136 mg/dL — AB (ref 65–99)

## 2015-10-09 DIAGNOSIS — E119 Type 2 diabetes mellitus without complications: Secondary | ICD-10-CM | POA: Diagnosis not present

## 2015-10-09 DIAGNOSIS — R410 Disorientation, unspecified: Secondary | ICD-10-CM | POA: Diagnosis not present

## 2015-10-09 LAB — GLUCOSE, CAPILLARY: Glucose-Capillary: 120 mg/dL — ABNORMAL HIGH (ref 65–99)

## 2015-10-10 DIAGNOSIS — R410 Disorientation, unspecified: Secondary | ICD-10-CM | POA: Diagnosis not present

## 2015-10-10 DIAGNOSIS — R262 Difficulty in walking, not elsewhere classified: Secondary | ICD-10-CM | POA: Diagnosis not present

## 2015-10-10 DIAGNOSIS — E119 Type 2 diabetes mellitus without complications: Secondary | ICD-10-CM | POA: Diagnosis not present

## 2015-10-10 DIAGNOSIS — M6281 Muscle weakness (generalized): Secondary | ICD-10-CM | POA: Diagnosis not present

## 2015-10-10 LAB — GLUCOSE, CAPILLARY: GLUCOSE-CAPILLARY: 118 mg/dL — AB (ref 65–99)

## 2015-10-11 DIAGNOSIS — E119 Type 2 diabetes mellitus without complications: Secondary | ICD-10-CM | POA: Diagnosis not present

## 2015-10-11 DIAGNOSIS — R410 Disorientation, unspecified: Secondary | ICD-10-CM | POA: Diagnosis not present

## 2015-10-11 LAB — GLUCOSE, CAPILLARY: Glucose-Capillary: 149 mg/dL — ABNORMAL HIGH (ref 65–99)

## 2015-10-12 DIAGNOSIS — R410 Disorientation, unspecified: Secondary | ICD-10-CM | POA: Diagnosis not present

## 2015-10-12 DIAGNOSIS — E119 Type 2 diabetes mellitus without complications: Secondary | ICD-10-CM | POA: Diagnosis not present

## 2015-10-12 LAB — GLUCOSE, CAPILLARY: Glucose-Capillary: 143 mg/dL — ABNORMAL HIGH (ref 65–99)

## 2015-10-13 DIAGNOSIS — R410 Disorientation, unspecified: Secondary | ICD-10-CM | POA: Diagnosis not present

## 2015-10-13 DIAGNOSIS — M6281 Muscle weakness (generalized): Secondary | ICD-10-CM | POA: Diagnosis not present

## 2015-10-13 DIAGNOSIS — E119 Type 2 diabetes mellitus without complications: Secondary | ICD-10-CM | POA: Diagnosis not present

## 2015-10-13 DIAGNOSIS — R262 Difficulty in walking, not elsewhere classified: Secondary | ICD-10-CM | POA: Diagnosis not present

## 2015-10-13 LAB — GLUCOSE, CAPILLARY: Glucose-Capillary: 128 mg/dL — ABNORMAL HIGH (ref 65–99)

## 2015-10-14 DIAGNOSIS — M6281 Muscle weakness (generalized): Secondary | ICD-10-CM | POA: Diagnosis not present

## 2015-10-14 DIAGNOSIS — E119 Type 2 diabetes mellitus without complications: Secondary | ICD-10-CM | POA: Diagnosis not present

## 2015-10-14 DIAGNOSIS — R262 Difficulty in walking, not elsewhere classified: Secondary | ICD-10-CM | POA: Diagnosis not present

## 2015-10-14 DIAGNOSIS — R410 Disorientation, unspecified: Secondary | ICD-10-CM | POA: Diagnosis not present

## 2015-10-14 LAB — GLUCOSE, CAPILLARY: GLUCOSE-CAPILLARY: 112 mg/dL — AB (ref 65–99)

## 2015-10-16 DIAGNOSIS — E119 Type 2 diabetes mellitus without complications: Secondary | ICD-10-CM | POA: Diagnosis not present

## 2015-10-16 DIAGNOSIS — R410 Disorientation, unspecified: Secondary | ICD-10-CM | POA: Diagnosis not present

## 2015-10-16 LAB — GLUCOSE, CAPILLARY: Glucose-Capillary: 118 mg/dL — ABNORMAL HIGH (ref 65–99)

## 2015-10-17 ENCOUNTER — Non-Acute Institutional Stay (SKILLED_NURSING_FACILITY): Payer: Medicare Other | Admitting: Gerontology

## 2015-10-17 DIAGNOSIS — F0390 Unspecified dementia without behavioral disturbance: Secondary | ICD-10-CM | POA: Diagnosis not present

## 2015-10-17 DIAGNOSIS — R262 Difficulty in walking, not elsewhere classified: Secondary | ICD-10-CM | POA: Diagnosis not present

## 2015-10-17 DIAGNOSIS — M6281 Muscle weakness (generalized): Secondary | ICD-10-CM | POA: Diagnosis not present

## 2015-10-17 DIAGNOSIS — G479 Sleep disorder, unspecified: Secondary | ICD-10-CM

## 2015-10-17 DIAGNOSIS — E119 Type 2 diabetes mellitus without complications: Secondary | ICD-10-CM | POA: Diagnosis not present

## 2015-10-17 DIAGNOSIS — R410 Disorientation, unspecified: Secondary | ICD-10-CM | POA: Diagnosis not present

## 2015-10-17 LAB — GLUCOSE, CAPILLARY: Glucose-Capillary: 107 mg/dL — ABNORMAL HIGH (ref 65–99)

## 2015-10-18 DIAGNOSIS — R410 Disorientation, unspecified: Secondary | ICD-10-CM | POA: Diagnosis not present

## 2015-10-18 DIAGNOSIS — F0390 Unspecified dementia without behavioral disturbance: Secondary | ICD-10-CM | POA: Insufficient documentation

## 2015-10-18 DIAGNOSIS — G479 Sleep disorder, unspecified: Secondary | ICD-10-CM | POA: Insufficient documentation

## 2015-10-18 DIAGNOSIS — E119 Type 2 diabetes mellitus without complications: Secondary | ICD-10-CM | POA: Diagnosis not present

## 2015-10-18 LAB — GLUCOSE, CAPILLARY: Glucose-Capillary: 130 mg/dL — ABNORMAL HIGH (ref 65–99)

## 2015-10-18 NOTE — Progress Notes (Signed)
Location:      Place of Service:  SNF (31) Provider:  Toni Arthurs, NP-C  Kirk Ruths., MD  Patient Care Team: Kirk Ruths, MD as PCP - General (Internal Medicine) Einar Pheasant, MD (Internal Medicine)  Extended Emergency Contact Information Primary Emergency Contact: Duwayne Heck Address: [address]          [place], [place] [address] [name] of [place] Phone: [phone] Relation: None Secondary Emergency Contact: [name] Address: [address]          [place], [place] [address] Home Phone: [phone] Relation: None  Code Status:  Full Goals of care: Advanced Directive information Advanced Directives 02/05/2015  Does patient have an advance directive? Yes  Type of Advance Directive [place]  Does patient want to make changes to advanced directive? No - Patient declined  Copy of advanced directive(s) in chart? Yes     Chief Complaint  Patient presents with  . Acute Visit    HPI:  Pt is a 82 y.o. [name] seen today for an acute visit for Reports of difficulty sleeping. Patient reports she falls asleep easily but has trouble staying asleep. Patient is on melatonin 3 mg at bedtime already as well as when necessary trazodone 50 mg. Trazodone is not being used very often. Patient also has dementia without behavioral disturbance. She has progressively started becoming more forgetful and having less self-awareness over the past several months. She is oriented to self, place, and family. She was able to tell me the correct month, but could not tell me the year. Her responses to questions and stimuli have become slower and more blunted. She continues to be able to feed herself, and continues her crafts that she enjoys. Family comes to visit daily. Vital signs stable, no other complaints.   Past Medical History:  Diagnosis Date  . Anemia   . Atrial fibrillation ([place])    post op, converted to NSR wtih amiodarone  . Bladder  cancer ([place]) 2003   s/p resection and chemotherapy, followed by Dr Jacqlyn Larsen  . COPD (chronic obstructive pulmonary disease) ([facility])   . Dementia   . Diabetes mellitus ([place])   . Diabetes mellitus without complication ([place])   . GERD (gastroesophageal reflux disease)   . Hypercholesterolemia   . Hyperlipemia   . Hypertension   . Hypothyroidism   . Inner ear disease   . Lung cancer ([place])    squamous cell s/p left upper lobe llingulectomy 12/08/04 - Dr Arlyce Dice   Past Surgical History:  Procedure Laterality Date  . BLADDER SURGERY  2003   for bladder cancer - Dr Jacqlyn Larsen  . CHOLECYSTECTOMY  1986  . LUNG CANCER SURGERY  12/08/04   s/p left upper lobe lingulectomy - Dr Arlyce Dice    Allergies  Allergen Reactions  . Lovenox [Enoxaparin Sodium]   . No Known Drug Allergy   . Novolog [Insulin Aspart]       Medication List       Accurate as of 10/17/15 11:59 PM. Always use your most recent med list.          acetaminophen 650 MG CR tablet Commonly known as:  TYLENOL Take 650 mg by mouth every 12 (twelve) hours.   acetaminophen 325 MG tablet Commonly known as:  TYLENOL Take 650 mg by mouth every 4 (four) hours as needed for moderate pain or fever.   albuterol (2.5 MG/3ML) 0.083% nebulizer solution Commonly known as:  PROVENTIL Take 3 mLs (2.5 mg total)  by nebulization every 6 (six) hours.   aspirin 81 MG tablet Take 81 mg by mouth daily.   bisacodyl 10 MG suppository Commonly known as:  DULCOLAX Place 10 mg rectally daily as needed for mild constipation or moderate constipation. *If Milk of Magnesia does not resolve constipation.*   budesonide 0.25 MG/2ML nebulizer solution Commonly known as:  PULMICORT Take 2 mLs (0.25 mg total) by nebulization 2 (two) times daily.   calcium-vitamin D 500-200 MG-UNIT tablet Commonly known as:  OSCAL WITH D Take 1 tablet by mouth 2 (two) times daily.   camphor-menthol lotion Commonly known as:  SARNA Apply 1 application topically as needed for  itching.   cetirizine 5 MG tablet Commonly known as:  ZYRTEC Take 5 mg by mouth at bedtime as needed for allergies.   cholecalciferol 1000 units tablet Commonly known as:  VITAMIN D Take 1,000 Units by mouth daily.   docusate sodium 100 MG capsule Commonly known as:  COLACE Take 100 mg by mouth 2 (two) times daily as needed for mild constipation or moderate constipation.   docusate sodium 100 MG capsule Commonly known as:  COLACE TAKE ONE CAPSULE BY MOUTH TWICE A DAY   Fluticasone-Salmeterol 250-50 MCG/DOSE Aepb Commonly known as:  ADVAIR DISKUS Inhale 1 puff into the lungs 2 (two) times daily.   folic acid 510 MCG tablet Commonly known as:  FOLVITE Take 400 mcg by mouth daily.   glimepiride 2 MG tablet Commonly known as:  AMARYL Take 3 mg by mouth 2 (two) times daily.   glimepiride 2 MG tablet Commonly known as:  AMARYL Take 2 mg by mouth daily.   guaifenesin 100 MG/5ML syrup Commonly known as:  ROBITUSSIN Take 200 mg by mouth every 4 (four) hours as needed for cough. *notify MD if symptoms continue more than 48 hours hours.   HYDROcodone-acetaminophen 5-325 MG tablet Commonly known as:  NORCO/VICODIN Take 1 tablet by mouth every 4 (four) hours as needed for moderate pain or severe pain.   Insulin Pen Needle 32G X 4 MM Misc Commonly known as:  CLICKFINE PEN NEEDLES Novolog FlexPen Needles-use twice a day   insulin regular 100 units/mL injection Commonly known as:  NOVOLIN R,HUMULIN R Inject into the skin 3 (three) times daily before meals. Sliding scale.   loperamide 2 MG tablet Commonly known as:  IMODIUM A-D Take 2-4 mg by mouth See admin instructions. Take 2 tablets ('4mg'$ ) by mouth for initial loose stool, then take 1 tablet by mouth after each subsequent loose stool as needed up to 8 doses in 24 hours.   losartan 50 MG tablet Commonly known as:  COZAAR Take 50 mg by mouth daily.   losartan 100 MG tablet Commonly known as:  COZAAR Take 100 mg by mouth  daily.   lovastatin 20 MG tablet Commonly known as:  MEVACOR Take 20 mg by mouth at bedtime.   magnesium hydroxide 400 MG/5ML suspension Commonly known as:  MILK OF MAGNESIA Take 30 mLs by mouth daily as needed for mild constipation. *no results in 24 hours may administer bisacodyl suppository*   metFORMIN 500 MG tablet Commonly known as:  GLUCOPHAGE Take 500 mg by mouth 2 (two) times daily with a meal.   metoprolol succinate 25 MG 24 hr tablet Commonly known as:  TOPROL-XL Take 25 mg by mouth daily.   metoprolol succinate 50 MG 24 hr tablet Commonly known as:  TOPROL-XL Take 1 tablet (50 mg total) by mouth daily. Take with or immediately following  a meal.   montelukast 10 MG tablet Commonly known as:  SINGULAIR Take 10 mg by mouth at bedtime.   nystatin cream Commonly known as:  MYCOSTATIN Apply topically 2 (two) times daily.   nystatin powder Commonly known as:  MYCOSTATIN/NYSTOP Apply topically 2 (two) times daily.   omeprazole 20 MG capsule Commonly known as:  PRILOSEC Take 20 mg by mouth daily.   rOPINIRole 0.25 MG tablet Commonly known as:  REQUIP Take 1 tablet (0.25 mg total) by mouth at bedtime.   senna 8.6 MG Tabs tablet Commonly known as:  SENOKOT Take 8.6 mg by mouth 2 (two) times daily.   tiotropium 18 MCG inhalation capsule Commonly known as:  SPIRIVA Place 18 mcg into inhaler and inhale daily.   tiotropium 18 MCG inhalation capsule Commonly known as:  SPIRIVA Place 1 capsule (18 mcg total) into inhaler and inhale daily.   traMADol 50 MG tablet Commonly known as:  ULTRAM Take 50 mg by mouth every 6 (six) hours as needed for moderate pain or severe pain.   triamcinolone cream 0.1 % Commonly known as:  KENALOG APPLY TOPICALLY TWICE A DAY   trolamine salicylate 10 % cream Commonly known as:  ASPERCREME Apply 1 application topically 4 (four) times daily as needed for muscle pain. Apply and massage on the right hip/outer thigh.   vitamin B-12  250 MCG tablet Commonly known as:  CYANOCOBALAMIN Take 250 mcg by mouth daily.       Review of Systems  Constitutional: Negative.   HENT: Negative for congestion, postnasal drip, rhinorrhea, sinus pressure, trouble swallowing and voice change.   Eyes: Negative.   Respiratory: Negative for cough, choking, chest tightness, shortness of breath and wheezing.   Cardiovascular: Negative for chest pain and leg swelling.  Gastrointestinal: Negative.   Genitourinary: Negative for dysuria, enuresis, frequency and urgency.  Musculoskeletal: Negative.   Skin: Negative.   Neurological: Negative.   Psychiatric/Behavioral: Positive for confusion and decreased concentration.  All other systems reviewed and are negative.   Immunization History  Administered Date(s) Administered  . Influenza Split 12/05/2011   Pertinent  Health Maintenance Due  Topic Date Due  . FOOT EXAM  12/05/1943  . OPHTHALMOLOGY EXAM  12/05/1943  . DEXA SCAN  12/05/2000  . PNA vac Low Risk Adult (1 of 2 - PCV13) 12/05/2000  . INFLUENZA VACCINE  08/23/2015  . HEMOGLOBIN A1C  11/09/2015   No flowsheet data found. Functional Status Survey:    Vitals:   10/14/15 1400  BP: 125/80  Pulse: 88  Resp: 16  Temp: (!) 96.8 F (36 C)  SpO2: 93%   There is no height or weight on file to calculate BMI. Physical Exam  Constitutional: She is oriented to person, place, and time. She appears well-developed and well-nourished. No distress.  HENT:  Head: Normocephalic and atraumatic.  Mouth/Throat: Oropharynx is clear and moist.  Eyes: Conjunctivae and EOM are normal. Pupils are equal, round, and reactive to light.  Neck: Normal range of motion. Neck supple. No JVD present.  Cardiovascular: Normal rate, regular rhythm, normal heart sounds and intact distal pulses.  Exam reveals no gallop and no friction rub.   No murmur heard. Pulmonary/Chest: Effort normal and breath sounds normal. No respiratory distress. She has no  wheezes. She has no rales. She exhibits no tenderness.  Abdominal: Soft. Bowel sounds are normal. She exhibits no distension. There is no tenderness. There is no guarding.  Lymphadenopathy:    She has no cervical adenopathy.  Neurological: She is alert and oriented to person, place, and time.  Skin: Skin is warm and dry. No rash noted. She is not diaphoretic. No erythema. No pallor.  Psychiatric: She has a normal mood and affect. Her behavior is normal. Judgment and thought content normal. Her speech is delayed. Cognition and memory are impaired. She exhibits abnormal recent memory.  Nursing note and vitals reviewed.   Labs reviewed:  Recent Labs  01/11/15 0903  02/06/15 0445 04/01/15 0455 05/10/15 0917  NA  --   < > 137 138 141  K  --   < > 3.9 3.4* 3.6  CL  --   < > 100* 105 105  CO2  --   < > '31 26 26  '$ GLUCOSE  --   < > 243* 163* 179*  BUN  --   < > 26* 19 23*  CREATININE  --   < > 0.97 0.76 0.95  CALCIUM  --   < > 8.7* 8.7* 9.4  MG 1.7  --   --   --   --   < > = values in this interval not displayed.  Recent Labs  10/29/14 1330 04/01/15 0455  AST 16 16  ALT 15 12*  ALKPHOS 57 48  BILITOT 0.2* 0.5  PROT 6.3* 5.8*  ALBUMIN 3.8 3.3*    Recent Labs  10/29/14 1330 02/05/15 1203 02/06/15 0445 04/01/15 0455  WBC 8.3 15.3* 9.6 7.3  NEUTROABS 4.4 9.9*  --  2.9  HGB 14.6 15.2 14.0 13.0  HCT Cindy.9 47.1* 42.4 39.3  MCV 90.5 89.9 90.1 87.6  PLT 227 305 234 250   Lab Results  Component Value Date   TSH 1.451 04/01/2015   Lab Results  Component Value Date   HGBA1C 9.2 (H) 05/10/2015   Lab Results  Component Value Date   CHOL 158 11/17/2013   HDL 37 (L) 11/17/2013   LDLCALC 71 11/17/2013   TRIG 248 (H) 11/17/2013    Significant Diagnostic Results in last 30 days:  No results found.  Assessment/Plan 1. Difficulty sleeping  Continue melatonin 3 mg by mouth daily at bedtime  Patient has trazodone 50 mg by mouth daily at bedtime when necessary ordered  already. The last time this was administered was 9/4. Increase utilization of trazodone.  2. Dementia without behavioral disturbance  Slowly progressive   Family/ staff Communication:   Total Time:  Documentation:  Face to Face:  Family/Phone:   Labs/tests ordered:    Medication list reviewed and assessed for continued appropriateness.  Vikki Ports, NP-C [facility]iatrics [facility] [phone], [place] [address] Cell Phone (Mon-Fri 8am-5pm):  [phone] On Call:  [phone] & follow prompts after 5pm & weekends Office Phone:  [phone] Office Fax:  [phone]

## 2015-10-19 DIAGNOSIS — R262 Difficulty in walking, not elsewhere classified: Secondary | ICD-10-CM | POA: Diagnosis not present

## 2015-10-19 DIAGNOSIS — E119 Type 2 diabetes mellitus without complications: Secondary | ICD-10-CM | POA: Diagnosis not present

## 2015-10-19 DIAGNOSIS — R410 Disorientation, unspecified: Secondary | ICD-10-CM | POA: Diagnosis not present

## 2015-10-19 DIAGNOSIS — M6281 Muscle weakness (generalized): Secondary | ICD-10-CM | POA: Diagnosis not present

## 2015-10-19 LAB — GLUCOSE, CAPILLARY: Glucose-Capillary: 108 mg/dL — ABNORMAL HIGH (ref 65–99)

## 2015-10-20 ENCOUNTER — Non-Acute Institutional Stay (SKILLED_NURSING_FACILITY): Payer: Medicare Other | Admitting: Gerontology

## 2015-10-20 DIAGNOSIS — M25561 Pain in right knee: Secondary | ICD-10-CM | POA: Diagnosis not present

## 2015-10-20 DIAGNOSIS — M7989 Other specified soft tissue disorders: Secondary | ICD-10-CM | POA: Diagnosis not present

## 2015-10-20 DIAGNOSIS — R410 Disorientation, unspecified: Secondary | ICD-10-CM | POA: Diagnosis not present

## 2015-10-20 DIAGNOSIS — G8929 Other chronic pain: Secondary | ICD-10-CM | POA: Diagnosis not present

## 2015-10-20 DIAGNOSIS — F0280 Dementia in other diseases classified elsewhere without behavioral disturbance: Secondary | ICD-10-CM

## 2015-10-20 DIAGNOSIS — M25551 Pain in right hip: Secondary | ICD-10-CM | POA: Diagnosis not present

## 2015-10-20 DIAGNOSIS — E119 Type 2 diabetes mellitus without complications: Secondary | ICD-10-CM | POA: Diagnosis not present

## 2015-10-20 LAB — GLUCOSE, CAPILLARY
Glucose-Capillary: 154 mg/dL — ABNORMAL HIGH (ref 65–99)
Glucose-Capillary: 254 mg/dL — ABNORMAL HIGH (ref 65–99)

## 2015-10-21 DIAGNOSIS — R410 Disorientation, unspecified: Secondary | ICD-10-CM | POA: Diagnosis not present

## 2015-10-21 DIAGNOSIS — R0989 Other specified symptoms and signs involving the circulatory and respiratory systems: Secondary | ICD-10-CM | POA: Diagnosis not present

## 2015-10-21 DIAGNOSIS — R262 Difficulty in walking, not elsewhere classified: Secondary | ICD-10-CM | POA: Diagnosis not present

## 2015-10-21 DIAGNOSIS — E119 Type 2 diabetes mellitus without complications: Secondary | ICD-10-CM | POA: Diagnosis not present

## 2015-10-21 DIAGNOSIS — M6281 Muscle weakness (generalized): Secondary | ICD-10-CM | POA: Diagnosis not present

## 2015-10-21 LAB — COMPREHENSIVE METABOLIC PANEL
ALBUMIN: 4 g/dL (ref 3.5–5.0)
ALK PHOS: 44 U/L (ref 38–126)
ALT: 18 U/L (ref 14–54)
AST: 22 U/L (ref 15–41)
Anion gap: 7 (ref 5–15)
BILIRUBIN TOTAL: 0.6 mg/dL (ref 0.3–1.2)
BUN: 22 mg/dL — AB (ref 6–20)
CALCIUM: 9.4 mg/dL (ref 8.9–10.3)
CO2: 27 mmol/L (ref 22–32)
Chloride: 107 mmol/L (ref 101–111)
Creatinine, Ser: 0.93 mg/dL (ref 0.44–1.00)
GFR calc Af Amer: >60 mL/min (ref >60)
GFR calc non Af Amer: 57 mL/min — ABNORMAL LOW (ref >60)
GLUCOSE: 101 mg/dL — AB (ref 65–99)
POTASSIUM: 4 mmol/L (ref 3.5–5.1)
SODIUM: 141 mmol/L (ref 135–145)
TOTAL PROTEIN: 6.9 g/dL (ref 6.5–8.1)

## 2015-10-21 LAB — CBC WITH DIFFERENTIAL/PLATELET
BASOS ABS: 0.1 10*3/uL (ref 0–0.1)
BASOS PCT: 1 %
Eosinophils Absolute: 0.4 10*3/uL (ref 0–0.7)
Eosinophils Relative: 4 %
HEMATOCRIT: 42.9 % (ref 35.0–47.0)
HEMOGLOBIN: 14.3 g/dL (ref 12.0–16.0)
Lymphocytes Relative: 37 %
Lymphs Abs: 3.8 10*3/uL — ABNORMAL HIGH (ref 1.0–3.6)
MCH: 30.1 pg (ref 26.0–34.0)
MCHC: 33.3 g/dL (ref 32.0–36.0)
MCV: 90.6 fL (ref 80.0–100.0)
Monocytes Absolute: 0.8 10*3/uL (ref 0.2–0.9)
Monocytes Relative: 8 %
NEUTROS ABS: 5.3 10*3/uL (ref 1.4–6.5)
NEUTROS PCT: 50 %
Platelets: 294 10*3/uL (ref 150–440)
RBC: 4.73 MIL/uL (ref 3.80–5.20)
RDW: 13.6 % (ref 11.5–14.5)
WBC: 10.4 10*3/uL (ref 3.6–11.0)

## 2015-10-21 LAB — LIPID PANEL
CHOL/HDL RATIO: 4.3 ratio
Cholesterol: 170 mg/dL (ref 0–200)
HDL: 40 mg/dL — ABNORMAL LOW (ref >40)
LDL CALC: 92 mg/dL (ref 0–99)
Triglycerides: 188 mg/dL — ABNORMAL HIGH (ref <150)
VLDL: 38 mg/dL (ref 0–40)

## 2015-10-21 LAB — TSH: TSH: 1.282 u[IU]/mL (ref 0.350–4.500)

## 2015-10-21 LAB — VITAMIN B12: Vitamin B-12: 878 pg/mL (ref 180–914)

## 2015-10-21 LAB — GLUCOSE, CAPILLARY
GLUCOSE-CAPILLARY: 165 mg/dL — AB (ref 65–99)
GLUCOSE-CAPILLARY: 110 mg/dL — AB (ref 65–99)

## 2015-10-21 LAB — MAGNESIUM: MAGNESIUM: 1.5 mg/dL — AB (ref 1.7–2.4)

## 2015-10-22 DIAGNOSIS — R410 Disorientation, unspecified: Secondary | ICD-10-CM | POA: Diagnosis not present

## 2015-10-22 DIAGNOSIS — E119 Type 2 diabetes mellitus without complications: Secondary | ICD-10-CM | POA: Diagnosis not present

## 2015-10-22 LAB — FOLATE: Folate: 52 ng/mL (ref >5.9)

## 2015-10-22 LAB — GLUCOSE, CAPILLARY: GLUCOSE-CAPILLARY: 116 mg/dL — AB (ref 65–99)

## 2015-10-23 ENCOUNTER — Encounter
Admission: RE | Admit: 2015-10-23 | Discharge: 2015-10-23 | Disposition: A | Discharge status: Home / Self Care | Payer: Medicare Other | Source: Ambulatory Visit | Attending: Internal Medicine | Admitting: Internal Medicine

## 2015-10-23 DIAGNOSIS — M25551 Pain in right hip: Secondary | ICD-10-CM

## 2015-10-23 DIAGNOSIS — G8929 Other chronic pain: Secondary | ICD-10-CM | POA: Insufficient documentation

## 2015-10-23 DIAGNOSIS — R350 Frequency of micturition: Secondary | ICD-10-CM | POA: Insufficient documentation

## 2015-10-23 LAB — HEMOGLOBIN A1C
HEMOGLOBIN A1C: 7 % — AB (ref 4.8–5.6)
MEAN PLASMA GLUCOSE: 154 mg/dL

## 2015-10-23 LAB — GLUCOSE, CAPILLARY: GLUCOSE-CAPILLARY: 87 mg/dL (ref 65–99)

## 2015-10-23 NOTE — Progress Notes (Signed)
Location:      Place of Service:  (P) SNF (31) Provider:  Toni Arthurs, NP-C  Kirk Ruths., MD  Patient Care Team: Kirk Ruths, MD as PCP - General (Internal Medicine) Einar Pheasant, MD (Internal Medicine)  Extended Emergency Contact Information Primary Emergency Contact: Duwayne Heck Address: [address]          [place], [place] [address] [name] of [place] Phone: [phone] Relation: None Secondary Emergency Contact: [name] Address: [address]          [place], [place] [address] Home Phone: [phone] Relation: None  Code Status:  Full Goals of care: Advanced Directive information Advanced Directives 02/05/2015  Does patient have an advance directive? Yes  Type of Advance Directive [place]  Does patient want to make changes to advanced directive? No - Patient declined  Copy of advanced directive(s) in chart? Yes     Chief Complaint  Patient presents with  . Acute Visit    HPI:  Pt is a 82 y.o. female seen today for an acute visit for Increased confusion and chronic pain. Sister is concerned about her increased confusion as of late. The increased confusion has had a slow onset over the past couple of months. She has gradually become less verbal. Family and staff are having increased difficulty understanding her speech. She has had increasing difficulty with walking. Sr. reports she was walking her to the bathroom today as is routine for her, when patient developed increased weakness and almost fell. Sr. had to call for assistance. Patient has had a recent fall, sliding out of her chair and onto her knees. She already has chronic pain in the right hip and thigh. Sister is concerned about fracture. Vital signs stable, no other complaints. Patient was treated for UTI on 8/28 with resolution of symptoms.   Past Medical History:  Diagnosis Date  . Anemia   . Atrial fibrillation ([facility])    post op, converted to NSR wtih  amiodarone  . Bladder cancer ([facility] [place]) 2003   s/p resection and chemotherapy, followed by Dr Jacqlyn Larsen  . COPD (chronic obstructive pulmonary disease) ([place])   . Dementia   . Diabetes mellitus ([facility])   . Diabetes mellitus without complication ([place] [facility])   . GERD (gastroesophageal reflux disease)   . Hypercholesterolemia   . Hyperlipemia   . Hypertension   . Hypothyroidism   . Inner ear disease   . Lung cancer ([place])    squamous cell s/p left upper lobe llingulectomy 12/08/04 - Dr Arlyce Dice   Past Surgical History:  Procedure Laterality Date  . BLADDER SURGERY  2003   for bladder cancer - Dr Jacqlyn Larsen  . CHOLECYSTECTOMY  1986  . LUNG CANCER SURGERY  12/08/04   s/p left upper lobe lingulectomy - Dr Arlyce Dice    Allergies  Allergen Reactions  . Lovenox [Enoxaparin Sodium]   . No Known Drug Allergy   . Novolog [Insulin Aspart]       Medication List       Accurate as of 10/20/15 11:59 PM. Always use your most recent med list.          acetaminophen 650 MG CR tablet Commonly known as:  TYLENOL Take 650 mg by mouth every 12 (twelve) hours.   acetaminophen 325 MG tablet Commonly known as:  TYLENOL Take 650 mg by mouth every 4 (four) hours as needed for moderate pain or fever.   albuterol (2.5 MG/3ML) 0.083% nebulizer solution Commonly known as:  PROVENTIL  Take 3 mLs (2.5 mg total) by nebulization every 6 (six) hours.   aspirin 81 MG tablet Take 81 mg by mouth daily.   bisacodyl 10 MG suppository Commonly known as:  DULCOLAX Place 10 mg rectally daily as needed for mild constipation or moderate constipation. *If Milk of Magnesia does not resolve constipation.*   budesonide 0.25 MG/2ML nebulizer solution Commonly known as:  PULMICORT Take 2 mLs (0.25 mg total) by nebulization 2 (two) times daily.   calcium-vitamin D 500-200 MG-UNIT tablet Commonly known as:  OSCAL WITH D Take 1 tablet by mouth 2 (two) times daily.   camphor-menthol lotion Commonly known as:  SARNA Apply 1 application  topically as needed for itching.   cetirizine 5 MG tablet Commonly known as:  ZYRTEC Take 5 mg by mouth at bedtime as needed for allergies.   cholecalciferol 1000 units tablet Commonly known as:  VITAMIN D Take 1,000 Units by mouth daily.   docusate sodium 100 MG capsule Commonly known as:  COLACE Take 100 mg by mouth 2 (two) times daily as needed for mild constipation or moderate constipation.   docusate sodium 100 MG capsule Commonly known as:  COLACE TAKE ONE CAPSULE BY MOUTH TWICE A DAY   Fluticasone-Salmeterol 250-50 MCG/DOSE Aepb Commonly known as:  ADVAIR DISKUS Inhale 1 puff into the lungs 2 (two) times daily.   folic acid 009 MCG tablet Commonly known as:  FOLVITE Take 400 mcg by mouth daily.   glimepiride 2 MG tablet Commonly known as:  AMARYL Take 3 mg by mouth 2 (two) times daily.   glimepiride 2 MG tablet Commonly known as:  AMARYL Take 2 mg by mouth daily.   guaifenesin 100 MG/5ML syrup Commonly known as:  ROBITUSSIN Take 200 mg by mouth every 4 (four) hours as needed for cough. *notify MD if symptoms continue more than 48 hours hours.   HYDROcodone-acetaminophen 5-325 MG tablet Commonly known as:  NORCO/VICODIN Take 1 tablet by mouth every 4 (four) hours as needed for moderate pain or severe pain.   Insulin Pen Needle 32G X 4 MM Misc Commonly known as:  CLICKFINE PEN NEEDLES Novolog FlexPen Needles-use twice a day   insulin regular 100 units/mL injection Commonly known as:  NOVOLIN R,HUMULIN R Inject into the skin 3 (three) times daily before meals. Sliding scale.   loperamide 2 MG tablet Commonly known as:  IMODIUM A-D Take 2-4 mg by mouth See admin instructions. Take 2 tablets (37m) by mouth for initial loose stool, then take 1 tablet by mouth after each subsequent loose stool as needed up to 8 doses in 24 hours.   losartan 50 MG tablet Commonly known as:  COZAAR Take 50 mg by mouth daily.   losartan 100 MG tablet Commonly known as:   COZAAR Take 100 mg by mouth daily.   lovastatin 20 MG tablet Commonly known as:  MEVACOR Take 20 mg by mouth at bedtime.   magnesium hydroxide 400 MG/5ML suspension Commonly known as:  MILK OF MAGNESIA Take 30 mLs by mouth daily as needed for mild constipation. *no results in 24 hours may administer bisacodyl suppository*   metFORMIN 500 MG tablet Commonly known as:  GLUCOPHAGE Take 500 mg by mouth 2 (two) times daily with a meal.   metoprolol succinate 25 MG 24 hr tablet Commonly known as:  TOPROL-XL Take 25 mg by mouth daily.   metoprolol succinate 50 MG 24 hr tablet Commonly known as:  TOPROL-XL Take 1 tablet (50 mg total) by mouth  daily. Take with or immediately following a meal.   montelukast 10 MG tablet Commonly known as:  SINGULAIR Take 10 mg by mouth at bedtime.   nystatin cream Commonly known as:  MYCOSTATIN Apply topically 2 (two) times daily.   nystatin powder Commonly known as:  MYCOSTATIN/NYSTOP Apply topically 2 (two) times daily.   omeprazole 20 MG capsule Commonly known as:  PRILOSEC Take 20 mg by mouth daily.   rOPINIRole 0.25 MG tablet Commonly known as:  REQUIP Take 1 tablet (0.25 mg total) by mouth at bedtime.   senna 8.6 MG Tabs tablet Commonly known as:  SENOKOT Take 8.6 mg by mouth 2 (two) times daily.   tiotropium 18 MCG inhalation capsule Commonly known as:  SPIRIVA Place 18 mcg into inhaler and inhale daily.   tiotropium 18 MCG inhalation capsule Commonly known as:  SPIRIVA Place 1 capsule (18 mcg total) into inhaler and inhale daily.   traMADol 50 MG tablet Commonly known as:  ULTRAM Take 50 mg by mouth every 6 (six) hours as needed for moderate pain or severe pain.   triamcinolone cream 0.1 % Commonly known as:  KENALOG APPLY TOPICALLY TWICE A DAY   trolamine salicylate 10 % cream Commonly known as:  ASPERCREME Apply 1 application topically 4 (four) times daily as needed for muscle pain. Apply and massage on the right  hip/outer thigh.   vitamin B-12 250 MCG tablet Commonly known as:  CYANOCOBALAMIN Take 250 mcg by mouth daily.       Review of Systems  HENT: Negative for congestion, postnasal drip, rhinorrhea, sinus pressure, trouble swallowing and voice change.   Eyes: Negative.   Respiratory: Negative for cough, choking, chest tightness, shortness of breath and wheezing.   Cardiovascular: Negative for chest pain and leg swelling.  Gastrointestinal: Negative.   Genitourinary: Negative for dysuria, enuresis, frequency and urgency.  Musculoskeletal: Positive for arthralgias and gait problem.  Skin: Negative.   Neurological: Positive for speech difficulty and weakness. Negative for dizziness, tremors, seizures, syncope and numbness.  Psychiatric/Behavioral: Positive for confusion and decreased concentration.  All other systems reviewed and are negative.   Immunization History  Administered Date(s) Administered  . Influenza Split 12/05/2011   Pertinent  Health Maintenance Due  Topic Date Due  . FOOT EXAM  12/05/1943  . OPHTHALMOLOGY EXAM  12/05/1943  . DEXA SCAN  12/05/2000  . PNA vac Low Risk Adult (1 of 2 - PCV13) 12/05/2000  . INFLUENZA VACCINE  08/23/2015  . HEMOGLOBIN A1C  11/09/2015   No flowsheet data found. Functional Status Survey:    Vitals:   10/14/15 1400  BP: 125/80  Pulse: 88  Resp: 16  Temp: (!) 96.8 F (36 C)  SpO2: 93%   There is no height or weight on file to calculate BMI. Physical Exam  Constitutional: She is oriented to person, place, and time. She appears well-developed and well-nourished. No distress.  HENT:  Head: Normocephalic and atraumatic.  Mouth/Throat: Oropharynx is clear and moist.  Eyes: Conjunctivae and EOM are normal. Pupils are equal, round, and reactive to light.  Neck: Normal range of motion. Neck supple. No JVD present.  Cardiovascular: Normal rate, regular rhythm, normal heart sounds and intact distal pulses.  Exam reveals no gallop and no  friction rub.   No murmur heard. Pulmonary/Chest: Effort normal. No respiratory distress. She has no wheezes. She has no rhonchi. She has rales in the right lower field. She exhibits no tenderness.  Abdominal: Soft. Bowel sounds are normal. She  exhibits no distension. There is no tenderness. There is no guarding.  Musculoskeletal:       Right hip: She exhibits tenderness.  Nontender to palpation. Sister reports patient rubbing hip and thigh a lot lately  Lymphadenopathy:    She has no cervical adenopathy.  Neurological: She is alert and oriented to person, place, and time.  Skin: Skin is warm and dry. No rash noted. She is not diaphoretic. No erythema. No pallor.  Psychiatric: She has a normal mood and affect. Her behavior is normal. Judgment and thought content normal. Her speech is delayed. Cognition and memory are impaired. She exhibits abnormal recent memory.  Nursing note and vitals reviewed.   Labs reviewed:  Recent Labs  01/11/15 0903  04/01/15 0455 05/10/15 0917 10/21/15 1850  NA  --   < > 138 141 141  K  --   < > 3.4* 3.6 4.0  CL  --   < > 105 105 107  CO2  --   < > _0 GLUCOSE  --   < > 163* 179* 101*  BUN  --   < > 19 23* 22*  CREATININE  --   < > 0.76 0.95 0.93  CALCIUM  --   < > 8.7* 9.4 9.4  MG 1.7  --   --   --  1.5*  < > = values in this interval not displayed.  Recent Labs  10/29/14 1330 04/01/15 0455 10/21/15 1850  AST _1 ALT 15 12* 18  ALKPHOS 57 48 44  BILITOT 0.2* 0.5 0.6  PROT 6.3* 5.8* 6.9  ALBUMIN 3.8 3.3* 4.0    Recent Labs  02/05/15 1203 02/06/15 0445 04/01/15 0455 10/21/15 1850  WBC 15.3* 9.6 7.3 10.4  NEUTROABS 9.9*  --  2.9 5.3  HGB 15.2 14.0 13.0 14.3  HCT 47.1* 42.4 39.3 42.9  MCV 89.9 90.1 87.6 90.6  PLT 305 234 250 294   Lab Results  Component Value Date   TSH 1.282 10/21/2015   Lab Results  Component Value Date   HGBA1C 7.0 (H) 10/21/2015   Lab Results  Component Value Date   CHOL 170 10/21/2015    HDL 40 (L) 10/21/2015   LDLCALC 92 10/21/2015   TRIG 188 (H) 10/21/2015   CHOLHDL 4.3 10/21/2015    Significant Diagnostic Results in last 30 days:  No results found.  Assessment/Plan 1. Dementia associated with other underlying disease without behavioral disturbance  Labs to evaluate organic cause of confusion  Possible CT of the brain per family request to rule out CVA  2. Chronic pain of right hip  X-ray right hip  X-ray right knee to rule out fracture  Continue Tylenol extended release 650 mg. Increase dose to every 8 hours  Change Aspercreme to 3 times a day scheduled  Continue gabapentin 300 mg by mouth daily at bedtime  At gabapentin 100 mg by mouth daily in the morning for chronic pain  3. Hypomagnesemia  Magnesium oxide 250 mg by mouth daily  Family/ staff Communication:   Total Time:  Documentation:  Face to Face:  Family/Phone: Sister at bedside during exam   Labs/tests ordered:  Cbc, met c, mag, folic acid, tsh, vit K87, Vit D, A1c,  Right Hip/knee Xray, CXR  Medication list reviewed and assessed for continued appropriateness.  Laboratory results reviewed. Labs unremarkable with the exception of hypomagnesemia. Will provide supplementation  Vikki Ports, NP-C [facility] [address], [place] [address] Cell Phone (Mon-Fri 8am-5pm):  [phone] On Call:  [phone] & follow prompts after 5pm & weekends Office Phone:  [phone] Office Fax:  [phone]

## 2015-10-24 DIAGNOSIS — M6281 Muscle weakness (generalized): Secondary | ICD-10-CM | POA: Diagnosis not present

## 2015-10-24 DIAGNOSIS — R262 Difficulty in walking, not elsewhere classified: Secondary | ICD-10-CM | POA: Diagnosis not present

## 2015-10-24 DIAGNOSIS — Z9181 History of falling: Secondary | ICD-10-CM | POA: Diagnosis not present

## 2015-10-24 LAB — VITAMIN D 25 HYDROXY (VIT D DEFICIENCY, FRACTURES): Vit D, 25-Hydroxy: 45.7 ng/mL (ref 30.0–100.0)

## 2015-10-25 DIAGNOSIS — M6281 Muscle weakness (generalized): Secondary | ICD-10-CM | POA: Diagnosis not present

## 2015-10-25 DIAGNOSIS — Z9181 History of falling: Secondary | ICD-10-CM | POA: Diagnosis not present

## 2015-10-25 DIAGNOSIS — R262 Difficulty in walking, not elsewhere classified: Secondary | ICD-10-CM | POA: Diagnosis not present

## 2015-10-28 DIAGNOSIS — Z9181 History of falling: Secondary | ICD-10-CM | POA: Diagnosis not present

## 2015-10-28 DIAGNOSIS — R262 Difficulty in walking, not elsewhere classified: Secondary | ICD-10-CM | POA: Diagnosis not present

## 2015-10-28 DIAGNOSIS — M6281 Muscle weakness (generalized): Secondary | ICD-10-CM | POA: Diagnosis not present

## 2015-10-31 DIAGNOSIS — R262 Difficulty in walking, not elsewhere classified: Secondary | ICD-10-CM | POA: Diagnosis not present

## 2015-10-31 DIAGNOSIS — M6281 Muscle weakness (generalized): Secondary | ICD-10-CM | POA: Diagnosis not present

## 2015-10-31 DIAGNOSIS — Z9181 History of falling: Secondary | ICD-10-CM | POA: Diagnosis not present

## 2015-11-01 DIAGNOSIS — R262 Difficulty in walking, not elsewhere classified: Secondary | ICD-10-CM | POA: Diagnosis not present

## 2015-11-01 DIAGNOSIS — Z9181 History of falling: Secondary | ICD-10-CM | POA: Diagnosis not present

## 2015-11-01 DIAGNOSIS — M6281 Muscle weakness (generalized): Secondary | ICD-10-CM | POA: Diagnosis not present

## 2015-11-08 DIAGNOSIS — N183 Chronic kidney disease, stage 3 (moderate): Secondary | ICD-10-CM | POA: Diagnosis not present

## 2015-11-08 DIAGNOSIS — E119 Type 2 diabetes mellitus without complications: Secondary | ICD-10-CM | POA: Diagnosis not present

## 2015-11-08 DIAGNOSIS — J449 Chronic obstructive pulmonary disease, unspecified: Secondary | ICD-10-CM | POA: Diagnosis not present

## 2015-11-11 DIAGNOSIS — R350 Frequency of micturition: Secondary | ICD-10-CM | POA: Diagnosis not present

## 2015-11-11 LAB — URINALYSIS COMPLETE WITH MICROSCOPIC (ARMC ONLY)
Bilirubin Urine: NEGATIVE
Glucose, UA: NEGATIVE mg/dL
HGB URINE DIPSTICK: NEGATIVE
KETONES UR: NEGATIVE mg/dL
Nitrite: POSITIVE — AB
PH: 5 (ref 5.0–8.0)
PROTEIN: NEGATIVE mg/dL
SPECIFIC GRAVITY, URINE: 1.023 (ref 1.005–1.030)

## 2015-11-13 LAB — URINE CULTURE

## 2015-11-14 DIAGNOSIS — R350 Frequency of micturition: Secondary | ICD-10-CM | POA: Diagnosis not present

## 2015-11-14 LAB — GLUCOSE, CAPILLARY: GLUCOSE-CAPILLARY: 137 mg/dL — AB (ref 65–99)

## 2015-11-15 ENCOUNTER — Non-Acute Institutional Stay (SKILLED_NURSING_FACILITY): Payer: Medicare Other | Admitting: Gerontology

## 2015-11-15 DIAGNOSIS — R350 Frequency of micturition: Secondary | ICD-10-CM | POA: Diagnosis not present

## 2015-11-15 DIAGNOSIS — N390 Urinary tract infection, site not specified: Secondary | ICD-10-CM | POA: Diagnosis not present

## 2015-11-15 LAB — GLUCOSE, CAPILLARY: GLUCOSE-CAPILLARY: 126 mg/dL — AB (ref 65–99)

## 2015-11-16 DIAGNOSIS — R350 Frequency of micturition: Secondary | ICD-10-CM | POA: Diagnosis not present

## 2015-11-16 LAB — GLUCOSE, CAPILLARY: GLUCOSE-CAPILLARY: 138 mg/dL — AB (ref 65–99)

## 2015-11-17 DIAGNOSIS — R350 Frequency of micturition: Secondary | ICD-10-CM | POA: Diagnosis not present

## 2015-11-17 LAB — GLUCOSE, CAPILLARY: GLUCOSE-CAPILLARY: 155 mg/dL — AB (ref 65–99)

## 2015-11-18 DIAGNOSIS — R350 Frequency of micturition: Secondary | ICD-10-CM | POA: Diagnosis not present

## 2015-11-18 LAB — GLUCOSE, CAPILLARY: Glucose-Capillary: 118 mg/dL — ABNORMAL HIGH (ref 65–99)

## 2015-11-19 DIAGNOSIS — R350 Frequency of micturition: Secondary | ICD-10-CM | POA: Diagnosis not present

## 2015-11-19 LAB — GLUCOSE, CAPILLARY: Glucose-Capillary: 155 mg/dL — ABNORMAL HIGH (ref 65–99)

## 2015-11-20 DIAGNOSIS — R350 Frequency of micturition: Secondary | ICD-10-CM | POA: Diagnosis not present

## 2015-11-20 LAB — GLUCOSE, CAPILLARY: GLUCOSE-CAPILLARY: 109 mg/dL — AB (ref 65–99)

## 2015-11-21 DIAGNOSIS — R350 Frequency of micturition: Secondary | ICD-10-CM | POA: Diagnosis not present

## 2015-11-21 LAB — GLUCOSE, CAPILLARY: GLUCOSE-CAPILLARY: 135 mg/dL — AB (ref 65–99)

## 2015-11-22 DIAGNOSIS — R350 Frequency of micturition: Secondary | ICD-10-CM | POA: Diagnosis not present

## 2015-11-22 LAB — GLUCOSE, CAPILLARY: GLUCOSE-CAPILLARY: 124 mg/dL — AB (ref 65–99)

## 2015-11-22 NOTE — Progress Notes (Signed)
Location:      Place of Service:  SNF (31) Provider:  Toni Arthurs, NP-C  Kirk Ruths., MD  Patient Care Team: Kirk Ruths, MD as PCP - General (Internal Medicine) Einar Pheasant, MD (Internal Medicine)  Extended Emergency Contact Information Primary Emergency Contact: Duwayne Heck Address: [address]          [place], [place] [address] [name] of [place] Phone: [phone] Relation: None Secondary Emergency Contact: [name] Address: [address]          [place],  [address] Home Phone: [phone] Relation: None  Code Status:  dnr Goals of care: Advanced Directive information Advanced Directives 02/05/2015  Does patient have an advance directive? Yes  Type of Advance Directive [place]  Does patient want to make changes to advanced directive? No - Patient declined  Copy of advanced directive(s) in chart? Yes     Chief Complaint  Patient presents with  . Acute Visit    HPI:  Pt is a 82 y.o. female seen today for an acute visit for increased confusion. Staff and family have noticed pt has been having increased confusion, odd behaviors. This started 1-2 days ago. Nursing obtained a urine sample and sent it off for evaluation. Was found to have a Klebsiella UTI. Evaluating for s/s of sepsis. None observed. No c/o n/v/d/f/c/cp/sob. No other c/o. VSS   Past Medical History:  Diagnosis Date  . Anemia   . Atrial fibrillation ([place])    post op, converted to NSR wtih amiodarone  . Bladder cancer ([place]) 2003   s/p resection and chemotherapy, followed by Dr Jacqlyn Larsen  . COPD (chronic obstructive pulmonary disease) ([place])   . Dementia   . Diabetes mellitus ([place])   . Diabetes mellitus without complication ([place])   . GERD (gastroesophageal reflux disease)   . Hypercholesterolemia   . Hyperlipemia   . Hypertension   . Hypothyroidism   . Inner ear disease   . Lung cancer ([place])    squamous cell s/p left upper lobe  llingulectomy 12/08/04 - Dr Arlyce Dice   Past Surgical History:  Procedure Laterality Date  . BLADDER SURGERY  2003   for bladder cancer - Dr Jacqlyn Larsen  . CHOLECYSTECTOMY  1986  . LUNG CANCER SURGERY  12/08/04   s/p left upper lobe lingulectomy - Dr Arlyce Dice    Allergies  Allergen Reactions  . Lovenox [Enoxaparin Sodium]   . No Known Drug Allergy   . Novolog [Insulin Aspart]       Medication List       Accurate as of 11/15/15 11:59 PM. Always use your most recent med list.          acetaminophen 650 MG CR tablet Commonly known as:  TYLENOL Take 650 mg by mouth every 12 (twelve) hours.   acetaminophen 325 MG tablet Commonly known as:  TYLENOL Take 650 mg by mouth every 4 (four) hours as needed for moderate pain or fever.   albuterol (2.5 MG/3ML) 0.083% nebulizer solution Commonly known as:  PROVENTIL Take 3 mLs (2.5 mg total) by nebulization every 6 (six) hours.   aspirin 81 MG tablet Take 81 mg by mouth daily.   bisacodyl 10 MG suppository Commonly known as:  DULCOLAX Place 10 mg rectally daily as needed for mild constipation or moderate constipation. *If Milk of Magnesia does not resolve constipation.*   budesonide 0.25 MG/2ML nebulizer solution Commonly known as:  PULMICORT Take 2 mLs (0.25 mg total) by nebulization 2 (two)  times daily.   calcium-vitamin D 500-200 MG-UNIT tablet Commonly known as:  OSCAL WITH D Take 1 tablet by mouth 2 (two) times daily.   camphor-menthol lotion Commonly known as:  SARNA Apply 1 application topically as needed for itching.   cetirizine 5 MG tablet Commonly known as:  ZYRTEC Take 5 mg by mouth at bedtime as needed for allergies.   cholecalciferol 1000 units tablet Commonly known as:  VITAMIN D Take 1,000 Units by mouth daily.   docusate sodium 100 MG capsule Commonly known as:  COLACE Take 100 mg by mouth 2 (two) times daily as needed for mild constipation or moderate constipation.   docusate sodium 100 MG capsule Commonly  known as:  COLACE TAKE ONE CAPSULE BY MOUTH TWICE A DAY   Fluticasone-Salmeterol 250-50 MCG/DOSE Aepb Commonly known as:  ADVAIR DISKUS Inhale 1 puff into the lungs 2 (two) times daily.   folic acid 376 MCG tablet Commonly known as:  FOLVITE Take 400 mcg by mouth daily.   glimepiride 2 MG tablet Commonly known as:  AMARYL Take 3 mg by mouth 2 (two) times daily.   glimepiride 2 MG tablet Commonly known as:  AMARYL Take 2 mg by mouth daily.   guaifenesin 100 MG/5ML syrup Commonly known as:  ROBITUSSIN Take 200 mg by mouth every 4 (four) hours as needed for cough. *notify MD if symptoms continue more than 48 hours hours.   HYDROcodone-acetaminophen 5-325 MG tablet Commonly known as:  NORCO/VICODIN Take 1 tablet by mouth every 4 (four) hours as needed for moderate pain or severe pain.   Insulin Pen Needle 32G X 4 MM Misc Commonly known as:  CLICKFINE PEN NEEDLES Novolog FlexPen Needles-use twice a day   insulin regular 100 units/mL injection Commonly known as:  NOVOLIN R,HUMULIN R Inject into the skin 3 (three) times daily before meals. Sliding scale.   loperamide 2 MG tablet Commonly known as:  IMODIUM A-D Take 2-4 mg by mouth See admin instructions. Take 2 tablets ('4mg'$ ) by mouth for initial loose stool, then take 1 tablet by mouth after each subsequent loose stool as needed up to 8 doses in 24 hours.   losartan 50 MG tablet Commonly known as:  COZAAR Take 50 mg by mouth daily.   losartan 100 MG tablet Commonly known as:  COZAAR Take 100 mg by mouth daily.   lovastatin 20 MG tablet Commonly known as:  MEVACOR Take 20 mg by mouth at bedtime.   magnesium hydroxide 400 MG/5ML suspension Commonly known as:  MILK OF MAGNESIA Take 30 mLs by mouth daily as needed for mild constipation. *no results in 24 hours may administer bisacodyl suppository*   metFORMIN 500 MG tablet Commonly known as:  GLUCOPHAGE Take 500 mg by mouth 2 (two) times daily with a meal.   metoprolol  succinate 25 MG 24 hr tablet Commonly known as:  TOPROL-XL Take 25 mg by mouth daily.   metoprolol succinate 50 MG 24 hr tablet Commonly known as:  TOPROL-XL Take 1 tablet (50 mg total) by mouth daily. Take with or immediately following a meal.   montelukast 10 MG tablet Commonly known as:  SINGULAIR Take 10 mg by mouth at bedtime.   nystatin cream Commonly known as:  MYCOSTATIN Apply topically 2 (two) times daily.   nystatin powder Commonly known as:  MYCOSTATIN/NYSTOP Apply topically 2 (two) times daily.   omeprazole 20 MG capsule Commonly known as:  PRILOSEC Take 20 mg by mouth daily.   rOPINIRole 0.25 MG  tablet Commonly known as:  REQUIP Take 1 tablet (0.25 mg total) by mouth at bedtime.   senna 8.6 MG Tabs tablet Commonly known as:  SENOKOT Take 8.6 mg by mouth 2 (two) times daily.   tiotropium 18 MCG inhalation capsule Commonly known as:  SPIRIVA Place 18 mcg into inhaler and inhale daily.   tiotropium 18 MCG inhalation capsule Commonly known as:  SPIRIVA Place 1 capsule (18 mcg total) into inhaler and inhale daily.   traMADol 50 MG tablet Commonly known as:  ULTRAM Take 50 mg by mouth every 6 (six) hours as needed for moderate pain or severe pain.   triamcinolone cream 0.1 % Commonly known as:  KENALOG APPLY TOPICALLY TWICE A DAY   trolamine salicylate 10 % cream Commonly known as:  ASPERCREME Apply 1 application topically 4 (four) times daily as needed for muscle pain. Apply and massage on the right hip/outer thigh.   vitamin B-12 250 MCG tablet Commonly known as:  CYANOCOBALAMIN Take 250 mcg by mouth daily.       Review of Systems  Constitutional: Negative.   HENT: Negative for congestion, postnasal drip, rhinorrhea, sinus pressure, trouble swallowing and voice change.   Eyes: Negative.   Respiratory: Negative for cough, choking, chest tightness, shortness of breath and wheezing.   Cardiovascular: Negative for chest pain and leg swelling.    Gastrointestinal: Negative.   Genitourinary: Positive for frequency. Negative for dysuria, enuresis and urgency.  Musculoskeletal: Negative.   Skin: Negative.   Neurological: Negative.   Psychiatric/Behavioral: Positive for confusion and decreased concentration.  All other systems reviewed and are negative.   Immunization History  Administered Date(s) Administered  . Influenza Split 12/05/2011   Pertinent  Health Maintenance Due  Topic Date Due  . FOOT EXAM  12/05/1943  . OPHTHALMOLOGY EXAM  12/05/1943  . DEXA SCAN  12/05/2000  . PNA vac Low Risk Adult (1 of 2 - PCV13) 12/05/2000  . INFLUENZA VACCINE  08/23/2015  . HEMOGLOBIN A1C  04/19/2016   No flowsheet data found. Functional Status Survey:    Vitals:   11/09/15 1103  BP: (!) 148/79  Pulse: 67  Resp: 18  Temp: 98.2 F (36.8 C)  SpO2: 97%  Weight: 142 lb (64.4 kg)   Body mass index is 23.63 kg/m. Physical Exam  Constitutional: She is oriented to person, place, and time. She appears well-developed and well-nourished. No distress.  HENT:  Head: Normocephalic and atraumatic.  Mouth/Throat: Oropharynx is clear and moist.  Eyes: Conjunctivae and EOM are normal. Pupils are equal, round, and reactive to light.  Neck: Normal range of motion. Neck supple. No JVD present.  Cardiovascular: Normal rate, regular rhythm, normal heart sounds and intact distal pulses.  Exam reveals no gallop and no friction rub.   No murmur heard. Pulmonary/Chest: Effort normal and breath sounds normal. No respiratory distress. She has no wheezes. She has no rales. She exhibits no tenderness.  Abdominal: Soft. Bowel sounds are normal. She exhibits no distension. There is no tenderness. There is no guarding.  Lymphadenopathy:    She has no cervical adenopathy.  Neurological: She is alert and oriented to person, place, and time.  Skin: Skin is warm and dry. No rash noted. She is not diaphoretic. No erythema. No pallor.  Psychiatric: She has a  normal mood and affect. Her behavior is normal. Judgment and thought content normal.  Nursing note and vitals reviewed.   Labs reviewed:  Recent Labs  01/11/15 [phone]  04/01/15 0455 05/10/15 4401  10/21/15 1850  NA  --   < > 138 141 141  K  --   < > 3.4* 3.6 4.0  CL  --   < > 105 105 107  CO2  --   < > '26 26 27  '$ GLUCOSE  --   < > 163* 179* 101*  BUN  --   < > 19 23* 22*  CREATININE  --   < > 0.76 0.95 0.93  CALCIUM  --   < > 8.7* 9.4 9.4  MG 1.7  --   --   --  1.5*  < > = values in this interval not displayed.  Recent Labs  04/01/15 0455 10/21/15 1850  AST 16 22  ALT 12* 18  ALKPHOS 48 44  BILITOT 0.5 0.6  PROT 5.8* 6.9  ALBUMIN 3.3* 4.0    Recent Labs  02/05/15 1203 02/06/15 0445 04/01/15 0455 10/21/15 1850  WBC 15.3* 9.6 7.3 10.4  NEUTROABS 9.9*  --  2.9 5.3  HGB 15.2 14.0 13.0 14.3  HCT 47.1* 42.4 39.3 42.9  MCV 89.9 90.1 87.6 90.6  PLT 305 234 250 294   Lab Results  Component Value Date   TSH 1.282 10/21/2015   Lab Results  Component Value Date   HGBA1C 7.0 (H) 10/21/2015   Lab Results  Component Value Date   CHOL 170 10/21/2015   HDL 40 (L) 10/21/2015   LDLCALC 92 10/21/2015   TRIG 188 (H) 10/21/2015   CHOLHDL 4.3 10/21/2015    Significant Diagnostic Results in last 30 days:  No results found.  Assessment/Plan 1. Urinary tract infection, site not specified  Cipro 250 mg 1 tab PO Q 12 hours x 5 days  Family/ staff Communication:   Total Time:  Documentation:  Face to Face:  Family/Phone:   Labs/tests ordered:  UA, C&S  Medication list reviewed and assessed for continued appropriateness.  Vikki Ports, NP-C [facility] [phone], [place] [address] Cell Phone (Mon-Fri 8am-5pm):  [phone] On Call:  [phone] & follow prompts after 5pm & weekends Office Phone:  [phone] Office Fax:  [phone]

## 2015-11-23 ENCOUNTER — Encounter
Admission: RE | Admit: 2015-11-23 | Discharge: 2015-11-23 | Disposition: A | Discharge status: Home / Self Care | Payer: Medicare Other | Source: Ambulatory Visit | Attending: Internal Medicine | Admitting: Internal Medicine

## 2015-11-23 DIAGNOSIS — E119 Type 2 diabetes mellitus without complications: Secondary | ICD-10-CM | POA: Insufficient documentation

## 2015-11-24 DIAGNOSIS — E119 Type 2 diabetes mellitus without complications: Secondary | ICD-10-CM | POA: Diagnosis not present

## 2015-11-24 LAB — GLUCOSE, CAPILLARY: GLUCOSE-CAPILLARY: 148 mg/dL — AB (ref 65–99)

## 2015-11-25 DIAGNOSIS — E119 Type 2 diabetes mellitus without complications: Secondary | ICD-10-CM | POA: Diagnosis not present

## 2015-11-25 LAB — GLUCOSE, CAPILLARY: GLUCOSE-CAPILLARY: 100 mg/dL — AB (ref 65–99)

## 2015-11-26 DIAGNOSIS — E119 Type 2 diabetes mellitus without complications: Secondary | ICD-10-CM | POA: Diagnosis not present

## 2015-11-26 LAB — GLUCOSE, CAPILLARY: GLUCOSE-CAPILLARY: 105 mg/dL — AB (ref 65–99)

## 2015-11-27 DIAGNOSIS — E119 Type 2 diabetes mellitus without complications: Secondary | ICD-10-CM | POA: Diagnosis not present

## 2015-11-27 LAB — GLUCOSE, CAPILLARY: GLUCOSE-CAPILLARY: 142 mg/dL — AB (ref 65–99)

## 2015-11-28 DIAGNOSIS — E119 Type 2 diabetes mellitus without complications: Secondary | ICD-10-CM | POA: Diagnosis not present

## 2015-11-28 LAB — GLUCOSE, CAPILLARY: Glucose-Capillary: 128 mg/dL — ABNORMAL HIGH (ref 65–99)

## 2015-11-29 DIAGNOSIS — E119 Type 2 diabetes mellitus without complications: Secondary | ICD-10-CM | POA: Diagnosis not present

## 2015-11-29 LAB — GLUCOSE, CAPILLARY: Glucose-Capillary: 157 mg/dL — ABNORMAL HIGH (ref 65–99)

## 2015-11-30 DIAGNOSIS — E119 Type 2 diabetes mellitus without complications: Secondary | ICD-10-CM | POA: Diagnosis not present

## 2015-11-30 LAB — GLUCOSE, CAPILLARY: Glucose-Capillary: 143 mg/dL — ABNORMAL HIGH (ref 65–99)

## 2015-12-01 DIAGNOSIS — E119 Type 2 diabetes mellitus without complications: Secondary | ICD-10-CM | POA: Diagnosis not present

## 2015-12-01 LAB — GLUCOSE, CAPILLARY: GLUCOSE-CAPILLARY: 205 mg/dL — AB (ref 65–99)

## 2015-12-02 DIAGNOSIS — E119 Type 2 diabetes mellitus without complications: Secondary | ICD-10-CM | POA: Diagnosis not present

## 2015-12-02 LAB — GLUCOSE, CAPILLARY: GLUCOSE-CAPILLARY: 113 mg/dL — AB (ref 65–99)

## 2015-12-03 DIAGNOSIS — E119 Type 2 diabetes mellitus without complications: Secondary | ICD-10-CM | POA: Diagnosis not present

## 2015-12-03 LAB — GLUCOSE, CAPILLARY: Glucose-Capillary: 112 mg/dL — ABNORMAL HIGH (ref 65–99)

## 2015-12-05 DIAGNOSIS — E119 Type 2 diabetes mellitus without complications: Secondary | ICD-10-CM | POA: Diagnosis not present

## 2015-12-05 LAB — GLUCOSE, CAPILLARY: GLUCOSE-CAPILLARY: 127 mg/dL — AB (ref 65–99)

## 2015-12-06 DIAGNOSIS — E119 Type 2 diabetes mellitus without complications: Secondary | ICD-10-CM | POA: Diagnosis not present

## 2015-12-06 LAB — GLUCOSE, CAPILLARY: GLUCOSE-CAPILLARY: 112 mg/dL — AB (ref 65–99)

## 2015-12-07 DIAGNOSIS — E119 Type 2 diabetes mellitus without complications: Secondary | ICD-10-CM | POA: Diagnosis not present

## 2015-12-07 LAB — GLUCOSE, CAPILLARY: GLUCOSE-CAPILLARY: 190 mg/dL — AB (ref 65–99)

## 2015-12-08 DIAGNOSIS — E119 Type 2 diabetes mellitus without complications: Secondary | ICD-10-CM | POA: Diagnosis not present

## 2015-12-08 LAB — GLUCOSE, CAPILLARY: GLUCOSE-CAPILLARY: 110 mg/dL — AB (ref 65–99)

## 2015-12-09 DIAGNOSIS — E119 Type 2 diabetes mellitus without complications: Secondary | ICD-10-CM | POA: Diagnosis not present

## 2015-12-09 LAB — GLUCOSE, CAPILLARY: Glucose-Capillary: 121 mg/dL — ABNORMAL HIGH (ref 65–99)

## 2015-12-10 DIAGNOSIS — E119 Type 2 diabetes mellitus without complications: Secondary | ICD-10-CM | POA: Diagnosis not present

## 2015-12-10 LAB — GLUCOSE, CAPILLARY: GLUCOSE-CAPILLARY: 103 mg/dL — AB (ref 65–99)

## 2015-12-11 DIAGNOSIS — E119 Type 2 diabetes mellitus without complications: Secondary | ICD-10-CM | POA: Diagnosis not present

## 2015-12-11 LAB — GLUCOSE, CAPILLARY: GLUCOSE-CAPILLARY: 130 mg/dL — AB (ref 65–99)

## 2015-12-12 DIAGNOSIS — E119 Type 2 diabetes mellitus without complications: Secondary | ICD-10-CM | POA: Diagnosis not present

## 2015-12-12 LAB — GLUCOSE, CAPILLARY: Glucose-Capillary: 114 mg/dL — ABNORMAL HIGH (ref 65–99)

## 2015-12-13 DIAGNOSIS — E119 Type 2 diabetes mellitus without complications: Secondary | ICD-10-CM | POA: Diagnosis not present

## 2015-12-13 LAB — GLUCOSE, CAPILLARY: Glucose-Capillary: 94 mg/dL (ref 65–99)

## 2015-12-14 DIAGNOSIS — E119 Type 2 diabetes mellitus without complications: Secondary | ICD-10-CM | POA: Diagnosis not present

## 2015-12-14 LAB — GLUCOSE, CAPILLARY
Glucose-Capillary: 108 mg/dL — ABNORMAL HIGH (ref 65–99)
Glucose-Capillary: 161 mg/dL — ABNORMAL HIGH (ref 65–99)

## 2015-12-15 DIAGNOSIS — E119 Type 2 diabetes mellitus without complications: Secondary | ICD-10-CM | POA: Diagnosis not present

## 2015-12-15 LAB — GLUCOSE, CAPILLARY: GLUCOSE-CAPILLARY: 120 mg/dL — AB (ref 65–99)

## 2015-12-17 DIAGNOSIS — E119 Type 2 diabetes mellitus without complications: Secondary | ICD-10-CM | POA: Diagnosis not present

## 2015-12-17 LAB — GLUCOSE, CAPILLARY: Glucose-Capillary: 115 mg/dL — ABNORMAL HIGH (ref 65–99)

## 2015-12-18 DIAGNOSIS — E119 Type 2 diabetes mellitus without complications: Secondary | ICD-10-CM | POA: Diagnosis not present

## 2015-12-18 LAB — GLUCOSE, CAPILLARY: Glucose-Capillary: 72 mg/dL (ref 65–99)

## 2015-12-19 DIAGNOSIS — E119 Type 2 diabetes mellitus without complications: Secondary | ICD-10-CM | POA: Diagnosis not present

## 2015-12-19 LAB — GLUCOSE, CAPILLARY: Glucose-Capillary: 135 mg/dL — ABNORMAL HIGH (ref 65–99)

## 2015-12-20 DIAGNOSIS — E119 Type 2 diabetes mellitus without complications: Secondary | ICD-10-CM | POA: Diagnosis not present

## 2015-12-20 LAB — GLUCOSE, CAPILLARY: GLUCOSE-CAPILLARY: 127 mg/dL — AB (ref 65–99)

## 2015-12-21 DIAGNOSIS — E119 Type 2 diabetes mellitus without complications: Secondary | ICD-10-CM | POA: Diagnosis not present

## 2015-12-21 LAB — GLUCOSE, CAPILLARY: GLUCOSE-CAPILLARY: 125 mg/dL — AB (ref 65–99)

## 2015-12-22 DIAGNOSIS — E119 Type 2 diabetes mellitus without complications: Secondary | ICD-10-CM | POA: Diagnosis not present

## 2015-12-22 LAB — GLUCOSE, CAPILLARY: GLUCOSE-CAPILLARY: 111 mg/dL — AB (ref 65–99)

## 2015-12-23 ENCOUNTER — Encounter
Admission: RE | Admit: 2015-12-23 | Discharge: 2015-12-23 | Disposition: A | Discharge status: Home / Self Care | Payer: Medicare Other | Source: Ambulatory Visit | Attending: Internal Medicine | Admitting: Internal Medicine

## 2015-12-23 DIAGNOSIS — R4182 Altered mental status, unspecified: Secondary | ICD-10-CM | POA: Insufficient documentation

## 2015-12-23 LAB — GLUCOSE, CAPILLARY: Glucose-Capillary: 131 mg/dL — ABNORMAL HIGH (ref 65–99)

## 2016-01-02 DIAGNOSIS — R4182 Altered mental status, unspecified: Secondary | ICD-10-CM | POA: Diagnosis not present

## 2016-01-02 LAB — URINALYSIS, COMPLETE (UACMP) WITH MICROSCOPIC
BILIRUBIN URINE: NEGATIVE
Bacteria, UA: NONE SEEN
GLUCOSE, UA: NEGATIVE mg/dL
Hgb urine dipstick: NEGATIVE
KETONES UR: NEGATIVE mg/dL
Nitrite: NEGATIVE
PROTEIN: NEGATIVE mg/dL
Specific Gravity, Urine: 1.016 (ref 1.005–1.030)
pH: 5 (ref 5.0–8.0)

## 2016-01-03 LAB — URINE CULTURE

## 2016-01-05 DIAGNOSIS — R4182 Altered mental status, unspecified: Secondary | ICD-10-CM | POA: Diagnosis not present

## 2016-01-05 LAB — URINALYSIS, COMPLETE (UACMP) WITH MICROSCOPIC
BACTERIA UA: NONE SEEN
BILIRUBIN URINE: NEGATIVE
Glucose, UA: NEGATIVE mg/dL
Hgb urine dipstick: NEGATIVE
Ketones, ur: NEGATIVE mg/dL
Leukocytes, UA: NEGATIVE
NITRITE: NEGATIVE
PH: 5 (ref 5.0–8.0)
Protein, ur: NEGATIVE mg/dL
RBC / HPF: NONE SEEN RBC/hpf (ref 0–5)
SPECIFIC GRAVITY, URINE: 1.015 (ref 1.005–1.030)
WBC UA: NONE SEEN WBC/hpf (ref 0–5)

## 2016-01-06 LAB — URINE CULTURE: Culture: NO GROWTH

## 2016-01-15 DIAGNOSIS — R4182 Altered mental status, unspecified: Secondary | ICD-10-CM | POA: Diagnosis not present

## 2016-01-15 LAB — GLUCOSE, CAPILLARY: Glucose-Capillary: 102 mg/dL — ABNORMAL HIGH (ref 65–99)

## 2016-01-17 DIAGNOSIS — R4182 Altered mental status, unspecified: Secondary | ICD-10-CM | POA: Diagnosis not present

## 2016-01-17 LAB — GLUCOSE, CAPILLARY: Glucose-Capillary: 122 mg/dL — ABNORMAL HIGH (ref 65–99)

## 2016-01-18 DIAGNOSIS — R4182 Altered mental status, unspecified: Secondary | ICD-10-CM | POA: Diagnosis not present

## 2016-01-18 LAB — GLUCOSE, CAPILLARY: GLUCOSE-CAPILLARY: 111 mg/dL — AB (ref 65–99)

## 2016-01-19 DIAGNOSIS — R4182 Altered mental status, unspecified: Secondary | ICD-10-CM | POA: Diagnosis not present

## 2016-01-19 LAB — GLUCOSE, CAPILLARY: Glucose-Capillary: 122 mg/dL — ABNORMAL HIGH (ref 65–99)

## 2016-01-20 DIAGNOSIS — R4182 Altered mental status, unspecified: Secondary | ICD-10-CM | POA: Diagnosis not present

## 2016-01-20 LAB — GLUCOSE, CAPILLARY: GLUCOSE-CAPILLARY: 157 mg/dL — AB (ref 65–99)

## 2016-01-21 DIAGNOSIS — R4182 Altered mental status, unspecified: Secondary | ICD-10-CM | POA: Diagnosis not present

## 2016-01-21 LAB — GLUCOSE, CAPILLARY: Glucose-Capillary: 132 mg/dL — ABNORMAL HIGH (ref 65–99)

## 2016-01-22 DIAGNOSIS — R4182 Altered mental status, unspecified: Secondary | ICD-10-CM | POA: Diagnosis not present

## 2016-01-22 LAB — GLUCOSE, CAPILLARY: GLUCOSE-CAPILLARY: 123 mg/dL — AB (ref 65–99)

## 2016-01-23 ENCOUNTER — Encounter
Admission: RE | Admit: 2016-01-23 | Discharge: 2016-01-23 | Disposition: A | Discharge status: Home / Self Care | Payer: Medicare Other | Source: Ambulatory Visit | Attending: Internal Medicine | Admitting: Internal Medicine

## 2016-01-23 DIAGNOSIS — R4182 Altered mental status, unspecified: Secondary | ICD-10-CM | POA: Insufficient documentation

## 2016-01-24 DIAGNOSIS — R4182 Altered mental status, unspecified: Secondary | ICD-10-CM | POA: Diagnosis present

## 2016-01-24 LAB — GLUCOSE, CAPILLARY: GLUCOSE-CAPILLARY: 137 mg/dL — AB (ref 65–99)

## 2016-01-25 DIAGNOSIS — R4182 Altered mental status, unspecified: Secondary | ICD-10-CM | POA: Diagnosis not present

## 2016-01-25 LAB — GLUCOSE, CAPILLARY: Glucose-Capillary: 123 mg/dL — ABNORMAL HIGH (ref 65–99)

## 2016-01-26 DIAGNOSIS — R4182 Altered mental status, unspecified: Secondary | ICD-10-CM | POA: Diagnosis not present

## 2016-01-26 LAB — URINALYSIS, ROUTINE W REFLEX MICROSCOPIC
BILIRUBIN URINE: NEGATIVE
GLUCOSE, UA: NEGATIVE mg/dL
HGB URINE DIPSTICK: NEGATIVE
Ketones, ur: NEGATIVE mg/dL
Leukocytes, UA: NEGATIVE
Nitrite: NEGATIVE
PROTEIN: NEGATIVE mg/dL
Specific Gravity, Urine: 1.017 (ref 1.005–1.030)
pH: 5 (ref 5.0–8.0)

## 2016-01-26 LAB — GLUCOSE, CAPILLARY: GLUCOSE-CAPILLARY: 126 mg/dL — AB (ref 65–99)

## 2016-01-27 DIAGNOSIS — R4182 Altered mental status, unspecified: Secondary | ICD-10-CM | POA: Diagnosis not present

## 2016-01-27 LAB — URINE CULTURE

## 2016-01-27 LAB — GLUCOSE, CAPILLARY: GLUCOSE-CAPILLARY: 107 mg/dL — AB (ref 65–99)

## 2016-01-28 DIAGNOSIS — R4182 Altered mental status, unspecified: Secondary | ICD-10-CM | POA: Diagnosis not present

## 2016-01-28 LAB — GLUCOSE, CAPILLARY: GLUCOSE-CAPILLARY: 160 mg/dL — AB (ref 65–99)

## 2016-01-31 DIAGNOSIS — R4182 Altered mental status, unspecified: Secondary | ICD-10-CM | POA: Diagnosis not present

## 2016-01-31 LAB — GLUCOSE, CAPILLARY: GLUCOSE-CAPILLARY: 165 mg/dL — AB (ref 65–99)

## 2016-02-01 DIAGNOSIS — R4182 Altered mental status, unspecified: Secondary | ICD-10-CM | POA: Diagnosis not present

## 2016-02-01 LAB — GLUCOSE, CAPILLARY: Glucose-Capillary: 156 mg/dL — ABNORMAL HIGH (ref 65–99)

## 2016-02-04 DIAGNOSIS — R4182 Altered mental status, unspecified: Secondary | ICD-10-CM | POA: Diagnosis not present

## 2016-02-04 LAB — GLUCOSE, CAPILLARY: GLUCOSE-CAPILLARY: 153 mg/dL — AB (ref 65–99)

## 2016-02-05 DIAGNOSIS — R4182 Altered mental status, unspecified: Secondary | ICD-10-CM | POA: Diagnosis not present

## 2016-02-05 LAB — GLUCOSE, CAPILLARY: Glucose-Capillary: 106 mg/dL — ABNORMAL HIGH (ref 65–99)

## 2016-02-06 DIAGNOSIS — R4182 Altered mental status, unspecified: Secondary | ICD-10-CM | POA: Diagnosis not present

## 2016-02-06 LAB — GLUCOSE, CAPILLARY: Glucose-Capillary: 143 mg/dL — ABNORMAL HIGH (ref 65–99)

## 2016-02-09 DIAGNOSIS — R4182 Altered mental status, unspecified: Secondary | ICD-10-CM | POA: Diagnosis not present

## 2016-02-09 LAB — GLUCOSE, CAPILLARY: Glucose-Capillary: 153 mg/dL — ABNORMAL HIGH (ref 65–99)

## 2016-02-10 DIAGNOSIS — R4182 Altered mental status, unspecified: Secondary | ICD-10-CM | POA: Diagnosis not present

## 2016-02-10 LAB — GLUCOSE, CAPILLARY: GLUCOSE-CAPILLARY: 128 mg/dL — AB (ref 65–99)

## 2016-02-14 DIAGNOSIS — R4182 Altered mental status, unspecified: Secondary | ICD-10-CM | POA: Diagnosis not present

## 2016-02-14 LAB — GLUCOSE, CAPILLARY: GLUCOSE-CAPILLARY: 151 mg/dL — AB (ref 65–99)

## 2016-02-15 DIAGNOSIS — R4182 Altered mental status, unspecified: Secondary | ICD-10-CM | POA: Diagnosis not present

## 2016-02-15 LAB — GLUCOSE, CAPILLARY: GLUCOSE-CAPILLARY: 118 mg/dL — AB (ref 65–99)

## 2016-02-18 DIAGNOSIS — R4182 Altered mental status, unspecified: Secondary | ICD-10-CM | POA: Diagnosis not present

## 2016-02-18 LAB — GLUCOSE, CAPILLARY: Glucose-Capillary: 79 mg/dL (ref 65–99)

## 2016-02-19 DIAGNOSIS — R4182 Altered mental status, unspecified: Secondary | ICD-10-CM | POA: Diagnosis not present

## 2016-02-19 LAB — GLUCOSE, CAPILLARY: GLUCOSE-CAPILLARY: 142 mg/dL — AB (ref 65–99)

## 2016-02-20 DIAGNOSIS — R4182 Altered mental status, unspecified: Secondary | ICD-10-CM | POA: Diagnosis not present

## 2016-02-20 LAB — GLUCOSE, CAPILLARY: GLUCOSE-CAPILLARY: 165 mg/dL — AB (ref 65–99)

## 2016-02-23 ENCOUNTER — Encounter
Admission: RE | Admit: 2016-02-23 | Discharge: 2016-02-23 | Disposition: A | Discharge status: Home / Self Care | Payer: Medicare Other | Source: Ambulatory Visit | Attending: Internal Medicine | Admitting: Internal Medicine

## 2016-02-23 DIAGNOSIS — E119 Type 2 diabetes mellitus without complications: Secondary | ICD-10-CM | POA: Insufficient documentation

## 2016-02-24 DIAGNOSIS — E119 Type 2 diabetes mellitus without complications: Secondary | ICD-10-CM | POA: Diagnosis not present

## 2016-02-24 LAB — GLUCOSE, CAPILLARY: Glucose-Capillary: 134 mg/dL — ABNORMAL HIGH (ref 65–99)

## 2016-02-28 DIAGNOSIS — E119 Type 2 diabetes mellitus without complications: Secondary | ICD-10-CM | POA: Diagnosis not present

## 2016-02-28 LAB — GLUCOSE, CAPILLARY: GLUCOSE-CAPILLARY: 91 mg/dL (ref 65–99)

## 2016-02-29 DIAGNOSIS — E119 Type 2 diabetes mellitus without complications: Secondary | ICD-10-CM | POA: Diagnosis not present

## 2016-02-29 LAB — GLUCOSE, CAPILLARY: Glucose-Capillary: 163 mg/dL — ABNORMAL HIGH (ref 65–99)

## 2016-03-03 DIAGNOSIS — E119 Type 2 diabetes mellitus without complications: Secondary | ICD-10-CM | POA: Diagnosis not present

## 2016-03-03 LAB — GLUCOSE, CAPILLARY: GLUCOSE-CAPILLARY: 124 mg/dL — AB (ref 65–99)

## 2016-03-04 DIAGNOSIS — E119 Type 2 diabetes mellitus without complications: Secondary | ICD-10-CM | POA: Diagnosis not present

## 2016-03-04 LAB — GLUCOSE, CAPILLARY: GLUCOSE-CAPILLARY: 154 mg/dL — AB (ref 65–99)

## 2016-03-05 DIAGNOSIS — E119 Type 2 diabetes mellitus without complications: Secondary | ICD-10-CM | POA: Diagnosis not present

## 2016-03-05 LAB — GLUCOSE, CAPILLARY: GLUCOSE-CAPILLARY: 138 mg/dL — AB (ref 65–99)

## 2016-03-08 DIAGNOSIS — E119 Type 2 diabetes mellitus without complications: Secondary | ICD-10-CM | POA: Diagnosis not present

## 2016-03-08 LAB — GLUCOSE, CAPILLARY: Glucose-Capillary: 104 mg/dL — ABNORMAL HIGH (ref 65–99)

## 2016-03-09 DIAGNOSIS — E119 Type 2 diabetes mellitus without complications: Secondary | ICD-10-CM | POA: Diagnosis not present

## 2016-03-09 LAB — GLUCOSE, CAPILLARY: Glucose-Capillary: 97 mg/dL (ref 65–99)

## 2016-03-13 DIAGNOSIS — E119 Type 2 diabetes mellitus without complications: Secondary | ICD-10-CM | POA: Diagnosis not present

## 2016-03-13 LAB — GLUCOSE, CAPILLARY: GLUCOSE-CAPILLARY: 148 mg/dL — AB (ref 65–99)

## 2016-03-14 DIAGNOSIS — E119 Type 2 diabetes mellitus without complications: Secondary | ICD-10-CM | POA: Diagnosis not present

## 2016-03-14 LAB — GLUCOSE, CAPILLARY: Glucose-Capillary: 112 mg/dL — ABNORMAL HIGH (ref 65–99)

## 2016-03-15 ENCOUNTER — Non-Acute Institutional Stay (SKILLED_NURSING_FACILITY): Payer: Medicare Other | Admitting: Gerontology

## 2016-03-15 DIAGNOSIS — J449 Chronic obstructive pulmonary disease, unspecified: Secondary | ICD-10-CM | POA: Diagnosis not present

## 2016-03-15 DIAGNOSIS — F0280 Dementia in other diseases classified elsewhere without behavioral disturbance: Secondary | ICD-10-CM

## 2016-03-16 DIAGNOSIS — E119 Type 2 diabetes mellitus without complications: Secondary | ICD-10-CM | POA: Diagnosis not present

## 2016-03-16 LAB — GLUCOSE, CAPILLARY: GLUCOSE-CAPILLARY: 96 mg/dL (ref 65–99)

## 2016-03-17 DIAGNOSIS — E119 Type 2 diabetes mellitus without complications: Secondary | ICD-10-CM | POA: Diagnosis not present

## 2016-03-17 LAB — GLUCOSE, CAPILLARY: GLUCOSE-CAPILLARY: 93 mg/dL (ref 65–99)

## 2016-03-18 DIAGNOSIS — E119 Type 2 diabetes mellitus without complications: Secondary | ICD-10-CM | POA: Diagnosis not present

## 2016-03-18 LAB — GLUCOSE, CAPILLARY: Glucose-Capillary: 103 mg/dL — ABNORMAL HIGH (ref 65–99)

## 2016-03-19 DIAGNOSIS — E119 Type 2 diabetes mellitus without complications: Secondary | ICD-10-CM | POA: Diagnosis not present

## 2016-03-19 LAB — GLUCOSE, CAPILLARY: GLUCOSE-CAPILLARY: 112 mg/dL — AB (ref 65–99)

## 2016-03-19 NOTE — Progress Notes (Signed)
Location:      Place of Service:  SNF (31) Provider:  Toni Arthurs, NP-C  Kirk Ruths., MD  Patient Care Team: Kirk Ruths, MD as PCP - General (Internal Medicine) Einar Pheasant, MD (Internal Medicine)  Extended Emergency Contact Information Primary Emergency Contact: Duwayne Heck Address: [address]          [place], [place] [address] [name] of [place] Phone: [phone] Relation: None Secondary Emergency Contact: [name] Address: [address]          [place], [place] [address] Home Phone: [phone] Relation: None  Code Status:  Full Goals of care: Advanced Directive information Advanced Directives 02/05/2015  Does Patient Have a Medical Advance Directive? Yes  Type of Advance Directive [place]  Does patient want to make changes to medical advance directive? No - Patient declined  Copy of [place] in Chart? Yes     Chief Complaint  Patient presents with  . Medicare Wellness    HPI:  Pt is a 83 y.o. female seen today for medical management of chronic diseases. She has a current diagnosis of COPD, unspecified. Pt denies any dyspnea/ SOB, chest pain. No recent exacerbations. Denies pain. Pt reports she is doing well overall. Pt also has chronic diagnosis of Dementia without behavioral disturbance. Pt is not on any medications for this condition except for Melatonin as a sleep aid. Pt enjoys spending her time making crafts and potholders. She is pleasantly confused. She is beginning to speak less, but is always smiling. Unable to obtain further ROS d/t limited verbalization. VSS. No other complaints.       Past Medical History:  Diagnosis Date  . Anemia   . Atrial fibrillation ([place])    post op, converted to NSR wtih amiodarone  . Bladder cancer ([place]) 2003   s/p resection and chemotherapy, followed by Dr Jacqlyn Larsen  . COPD (chronic obstructive pulmonary disease) ([place])   . Dementia   . Diabetes  mellitus ([place])   . Diabetes mellitus without complication ([place])   . GERD (gastroesophageal reflux disease)   . Hypercholesterolemia   . Hyperlipemia   . Hypertension   . Hypothyroidism   . Inner ear disease   . Lung cancer ([place])    squamous cell s/p left upper lobe llingulectomy 12/08/04 - Dr Arlyce Dice   Past Surgical History:  Procedure Laterality Date  . BLADDER SURGERY  2003   for bladder cancer - Dr Jacqlyn Larsen  . CHOLECYSTECTOMY  1986  . LUNG CANCER SURGERY  12/08/04   s/p left upper lobe lingulectomy - Dr Arlyce Dice    Allergies  Allergen Reactions  . Lovenox [Enoxaparin Sodium]   . No Known Drug Allergy   . Novolog [Insulin Aspart]     Allergies as of 03/15/2016      Reactions   Lovenox [enoxaparin Sodium]    No Known Drug Allergy    Novolog [insulin Aspart]       Medication List       Accurate as of 03/15/16 11:59 PM. Always use your most recent med list.          acetaminophen 650 MG CR tablet Commonly known as:  TYLENOL Take 650 mg by mouth every 12 (twelve) hours.   acetaminophen 325 MG tablet Commonly known as:  TYLENOL Take 650 mg by mouth every 4 (four) hours as needed for moderate pain or fever.   albuterol (2.5 MG/3ML) 0.083% nebulizer solution Commonly known as:  PROVENTIL Take  3 mLs (2.5 mg total) by nebulization every 6 (six) hours.   aspirin 81 MG tablet Take 81 mg by mouth daily.   bisacodyl 10 MG suppository Commonly known as:  DULCOLAX Place 10 mg rectally daily as needed for mild constipation or moderate constipation. *If Milk of Magnesia does not resolve constipation.*   budesonide 0.25 MG/2ML nebulizer solution Commonly known as:  PULMICORT Take 2 mLs (0.25 mg total) by nebulization 2 (two) times daily.   calcium-vitamin D 500-200 MG-UNIT tablet Commonly known as:  OSCAL WITH D Take 1 tablet by mouth 2 (two) times daily.   camphor-menthol lotion Commonly known as:  SARNA Apply 1 application topically as needed for itching.     cetirizine 5 MG tablet Commonly known as:  ZYRTEC Take 5 mg by mouth at bedtime as needed for allergies.   cholecalciferol 1000 units tablet Commonly known as:  VITAMIN D Take 1,000 Units by mouth daily.   docusate sodium 100 MG capsule Commonly known as:  COLACE Take 100 mg by mouth 2 (two) times daily as needed for mild constipation or moderate constipation.   docusate sodium 100 MG capsule Commonly known as:  COLACE TAKE ONE CAPSULE BY MOUTH TWICE A DAY   Fluticasone-Salmeterol 250-50 MCG/DOSE Aepb Commonly known as:  ADVAIR DISKUS Inhale 1 puff into the lungs 2 (two) times daily.   folic acid 993 MCG tablet Commonly known as:  FOLVITE Take 400 mcg by mouth daily.   glimepiride 2 MG tablet Commonly known as:  AMARYL Take 3 mg by mouth 2 (two) times daily.   glimepiride 2 MG tablet Commonly known as:  AMARYL Take 2 mg by mouth daily.   guaifenesin 100 MG/5ML syrup Commonly known as:  ROBITUSSIN Take 200 mg by mouth every 4 (four) hours as needed for cough. *notify MD if symptoms continue more than 48 hours hours.   HYDROcodone-acetaminophen 5-325 MG tablet Commonly known as:  NORCO/VICODIN Take 1 tablet by mouth every 4 (four) hours as needed for moderate pain or severe pain.   Insulin Pen Needle 32G X 4 MM Misc Commonly known as:  CLICKFINE PEN NEEDLES Novolog FlexPen Needles-use twice a day   insulin regular 250 units/2.2m (100 units/mL) injection Commonly known as:  NOVOLIN R,HUMULIN R Inject into the skin 3 (three) times daily before meals. Sliding scale.   loperamide 2 MG tablet Commonly known as:  IMODIUM A-D Take 2-4 mg by mouth See admin instructions. Take 2 tablets ('4mg'$ ) by mouth for initial loose stool, then take 1 tablet by mouth after each subsequent loose stool as needed up to 8 doses in 24 hours.   losartan 50 MG tablet Commonly known as:  COZAAR Take 50 mg by mouth daily.   losartan 100 MG tablet Commonly known as:  COZAAR Take 100 mg by  mouth daily.   lovastatin 20 MG tablet Commonly known as:  MEVACOR Take 20 mg by mouth at bedtime.   magnesium hydroxide 400 MG/5ML suspension Commonly known as:  MILK OF MAGNESIA Take 30 mLs by mouth daily as needed for mild constipation. *no results in 24 hours may administer bisacodyl suppository*   metFORMIN 500 MG tablet Commonly known as:  GLUCOPHAGE Take 500 mg by mouth 2 (two) times daily with a meal.   metoprolol succinate 25 MG 24 hr tablet Commonly known as:  TOPROL-XL Take 25 mg by mouth daily.   metoprolol succinate 50 MG 24 hr tablet Commonly known as:  TOPROL-XL Take 1 tablet (50 mg total)  by mouth daily. Take with or immediately following a meal.   montelukast 10 MG tablet Commonly known as:  SINGULAIR Take 10 mg by mouth at bedtime.   nystatin cream Commonly known as:  MYCOSTATIN Apply topically 2 (two) times daily.   nystatin powder Commonly known as:  MYCOSTATIN/NYSTOP Apply topically 2 (two) times daily.   omeprazole 20 MG capsule Commonly known as:  PRILOSEC Take 20 mg by mouth daily.   rOPINIRole 0.25 MG tablet Commonly known as:  REQUIP Take 1 tablet (0.25 mg total) by mouth at bedtime.   senna 8.6 MG Tabs tablet Commonly known as:  SENOKOT Take 8.6 mg by mouth 2 (two) times daily.   tiotropium 18 MCG inhalation capsule Commonly known as:  SPIRIVA Place 18 mcg into inhaler and inhale daily.   tiotropium 18 MCG inhalation capsule Commonly known as:  SPIRIVA Place 1 capsule (18 mcg total) into inhaler and inhale daily.   traMADol 50 MG tablet Commonly known as:  ULTRAM Take 50 mg by mouth every 6 (six) hours as needed for moderate pain or severe pain.   triamcinolone cream 0.1 % Commonly known as:  KENALOG APPLY TOPICALLY TWICE A DAY   trolamine salicylate 10 % cream Commonly known as:  ASPERCREME Apply 1 application topically 4 (four) times daily as needed for muscle pain. Apply and massage on the right hip/outer thigh.   vitamin  B-12 250 MCG tablet Commonly known as:  CYANOCOBALAMIN Take 250 mcg by mouth daily.       Review of Systems  Constitutional: Negative.   HENT: Negative for congestion, postnasal drip, rhinorrhea, sinus pressure, trouble swallowing and voice change.   Eyes: Negative.   Respiratory: Negative for cough, choking, chest tightness, shortness of breath and wheezing.   Cardiovascular: Negative for chest pain and leg swelling.  Gastrointestinal: Negative.   Genitourinary: Negative.   Musculoskeletal: Negative.   Skin: Negative.   Neurological: Negative.   Psychiatric/Behavioral: Negative.   All other systems reviewed and are negative.   Immunization History  Administered Date(s) Administered  . Influenza Split 12/05/2011   Pertinent  Health Maintenance Due  Topic Date Due  . FOOT EXAM  12/05/1943  . OPHTHALMOLOGY EXAM  12/05/1943  . DEXA SCAN  12/05/2000  . PNA vac Low Risk Adult (1 of 2 - PCV13) 12/05/2000  . INFLUENZA VACCINE  08/23/2015  . HEMOGLOBIN A1C  04/19/2016   No flowsheet data found. Functional Status Survey:    Vitals:   02/29/16 1020  BP: 117/73  Pulse: 72  Resp: 18  Temp: 97.3 F (36.3 C)  SpO2: 94%  Weight: 138 lb 8 oz (62.8 kg)   Body mass index is 23.05 kg/m. Physical Exam  Constitutional: She is oriented to person, place, and time. She appears well-developed and well-nourished. No distress.  HENT:  Head: Normocephalic and atraumatic.  Mouth/Throat: Oropharynx is clear and moist.  Eyes: Conjunctivae and EOM are normal. Pupils are equal, round, and reactive to light.  Neck: Normal range of motion. Neck supple. No JVD present.  Cardiovascular: Normal rate, regular rhythm, normal heart sounds and intact distal pulses.  Exam reveals no gallop and no friction rub.   No murmur heard. Pulmonary/Chest: Effort normal and breath sounds normal. No respiratory distress. She has no wheezes. She has no rales. She exhibits no tenderness.  Abdominal: Soft. Bowel  sounds are normal. She exhibits no distension. There is no tenderness. There is no rebound and no guarding.  Lymphadenopathy:    She has  no cervical adenopathy.  Neurological: She is alert and oriented to person, place, and time.  Skin: Skin is warm and dry. No rash noted. She is not diaphoretic. No erythema. No pallor.  Psychiatric: She has a normal mood and affect. Her behavior is normal. Judgment and thought content normal.  Nursing note and vitals reviewed.   Labs reviewed:  Recent Labs  04/01/15 0455 05/10/15 0917 10/21/15 1850  NA 138 141 141  K 3.4* 3.6 4.0  CL 105 105 107  CO2 '26 26 27  '$ GLUCOSE 163* 179* 101*  BUN 19 23* 22*  CREATININE 0.76 0.95 0.93  CALCIUM 8.7* 9.4 9.4  MG  --   --  1.5*    Recent Labs  04/01/15 0455 10/21/15 1850  AST 16 22  ALT 12* 18  ALKPHOS 48 44  BILITOT 0.5 0.6  PROT 5.8* 6.9  ALBUMIN 3.3* 4.0    Recent Labs  04/01/15 0455 10/21/15 1850  WBC 7.3 10.4  NEUTROABS 2.9 5.3  HGB 13.0 14.3  HCT 39.3 42.9  MCV 87.6 90.6  PLT 250 294   Lab Results  Component Value Date   TSH 1.282 10/21/2015   Lab Results  Component Value Date   HGBA1C 7.0 (H) 10/21/2015   Lab Results  Component Value Date   CHOL 170 10/21/2015   HDL 40 (L) 10/21/2015   LDLCALC 92 10/21/2015   TRIG 188 (H) 10/21/2015   CHOLHDL 4.3 10/21/2015    Significant Diagnostic Results in last 30 days:  No results found.  Assessment/Plan 1. Dementia associated with other underlying disease without behavioral disturbance  Melatonin 6 mg po Q HS for sleep  Fall precautions   Safety precautions  2. Chronic obstructive pulmonary disease, unspecified COPD type (HCC)  Continue current regimen of care  Continue Spiriva 18 mcg IH Q Day  Continue Singulair 10 mg po Q HS  Continue Advair 115-21- 2 inhalations BID   Symptoms appear to be well controlled.   No recent exacerbations.   Family/ staff Communication:   Total Time: 30  minutes  Documentation: 15 minutes  Face to Face: 15 minutes  Family/Phone:   Labs/tests ordered:  none   Vikki Ports, NP-C [facility] [address], [place] [address] Cell Phone (Mon-Fri 8am-5pm):  [phone] On Call:  [phone] & follow prompts after 5pm & weekends Office Phone:  [phone] Office Fax:  [phone]

## 2016-03-22 ENCOUNTER — Inpatient Hospital Stay: Admit: 2016-03-22 | Payer: Self-pay

## 2016-03-22 ENCOUNTER — Encounter
Admission: RE | Admit: 2016-03-22 | Discharge: 2016-03-22 | Disposition: A | Discharge status: Home / Self Care | Payer: Medicare Other | Source: Ambulatory Visit | Attending: Internal Medicine | Admitting: Internal Medicine

## 2016-03-22 DIAGNOSIS — R4182 Altered mental status, unspecified: Secondary | ICD-10-CM | POA: Insufficient documentation

## 2016-03-22 DIAGNOSIS — R410 Disorientation, unspecified: Secondary | ICD-10-CM | POA: Diagnosis not present

## 2016-03-22 LAB — GLUCOSE, CAPILLARY: Glucose-Capillary: 119 mg/dL — ABNORMAL HIGH (ref 65–99)

## 2016-03-23 DIAGNOSIS — R410 Disorientation, unspecified: Secondary | ICD-10-CM | POA: Diagnosis not present

## 2016-03-23 DIAGNOSIS — R4182 Altered mental status, unspecified: Secondary | ICD-10-CM | POA: Diagnosis not present

## 2016-03-23 LAB — GLUCOSE, CAPILLARY: GLUCOSE-CAPILLARY: 104 mg/dL — AB (ref 65–99)

## 2016-03-27 DIAGNOSIS — R410 Disorientation, unspecified: Secondary | ICD-10-CM | POA: Diagnosis not present

## 2016-03-27 DIAGNOSIS — R4182 Altered mental status, unspecified: Secondary | ICD-10-CM | POA: Diagnosis not present

## 2016-03-27 LAB — GLUCOSE, CAPILLARY: GLUCOSE-CAPILLARY: 132 mg/dL — AB (ref 65–99)

## 2016-03-28 DIAGNOSIS — R410 Disorientation, unspecified: Secondary | ICD-10-CM | POA: Diagnosis not present

## 2016-03-28 DIAGNOSIS — R4182 Altered mental status, unspecified: Secondary | ICD-10-CM | POA: Diagnosis not present

## 2016-03-28 LAB — GLUCOSE, CAPILLARY: GLUCOSE-CAPILLARY: 109 mg/dL — AB (ref 65–99)

## 2016-04-02 DIAGNOSIS — J410 Simple chronic bronchitis: Secondary | ICD-10-CM | POA: Diagnosis not present

## 2016-04-02 DIAGNOSIS — R4182 Altered mental status, unspecified: Secondary | ICD-10-CM | POA: Diagnosis not present

## 2016-04-02 DIAGNOSIS — N183 Chronic kidney disease, stage 3 (moderate): Secondary | ICD-10-CM | POA: Diagnosis not present

## 2016-04-02 DIAGNOSIS — R410 Disorientation, unspecified: Secondary | ICD-10-CM | POA: Diagnosis not present

## 2016-04-02 DIAGNOSIS — G301 Alzheimer's disease with late onset: Secondary | ICD-10-CM | POA: Diagnosis not present

## 2016-04-02 DIAGNOSIS — E1122 Type 2 diabetes mellitus with diabetic chronic kidney disease: Secondary | ICD-10-CM | POA: Diagnosis not present

## 2016-04-02 LAB — GLUCOSE, CAPILLARY
GLUCOSE-CAPILLARY: 124 mg/dL — AB (ref 65–99)
Glucose-Capillary: 98 mg/dL (ref 65–99)
Glucose-Capillary: 121 mg/dL — ABNORMAL HIGH (ref 65–99)

## 2016-04-05 DIAGNOSIS — R410 Disorientation, unspecified: Secondary | ICD-10-CM | POA: Diagnosis not present

## 2016-04-05 DIAGNOSIS — R4182 Altered mental status, unspecified: Secondary | ICD-10-CM | POA: Diagnosis not present

## 2016-04-05 LAB — GLUCOSE, CAPILLARY: Glucose-Capillary: 136 mg/dL — ABNORMAL HIGH (ref 65–99)

## 2016-04-06 DIAGNOSIS — R4182 Altered mental status, unspecified: Secondary | ICD-10-CM | POA: Diagnosis not present

## 2016-04-06 DIAGNOSIS — R410 Disorientation, unspecified: Secondary | ICD-10-CM | POA: Diagnosis not present

## 2016-04-06 LAB — GLUCOSE, CAPILLARY: Glucose-Capillary: 123 mg/dL — ABNORMAL HIGH (ref 65–99)

## 2016-04-10 ENCOUNTER — Other Ambulatory Visit
Admission: RE | Admit: 2016-04-10 | Discharge: 2016-04-10 | Disposition: A | Discharge status: Home / Self Care | Payer: Medicare Other | Source: Ambulatory Visit | Attending: Internal Medicine | Admitting: Internal Medicine

## 2016-04-10 DIAGNOSIS — R410 Disorientation, unspecified: Secondary | ICD-10-CM | POA: Diagnosis not present

## 2016-04-10 DIAGNOSIS — R350 Frequency of micturition: Secondary | ICD-10-CM | POA: Insufficient documentation

## 2016-04-10 DIAGNOSIS — R4182 Altered mental status, unspecified: Secondary | ICD-10-CM | POA: Diagnosis not present

## 2016-04-10 LAB — URINALYSIS, COMPLETE (UACMP) WITH MICROSCOPIC
BILIRUBIN URINE: NEGATIVE
Bacteria, UA: NONE SEEN
Hgb urine dipstick: NEGATIVE
KETONES UR: NEGATIVE mg/dL
LEUKOCYTES UA: NEGATIVE
Nitrite: NEGATIVE
PH: 5 (ref 5.0–8.0)
Protein, ur: NEGATIVE mg/dL
Specific Gravity, Urine: 1.02 (ref 1.005–1.030)

## 2016-04-10 LAB — GLUCOSE, CAPILLARY: GLUCOSE-CAPILLARY: 166 mg/dL — AB (ref 65–99)

## 2016-04-11 DIAGNOSIS — R410 Disorientation, unspecified: Secondary | ICD-10-CM | POA: Diagnosis not present

## 2016-04-11 DIAGNOSIS — R4182 Altered mental status, unspecified: Secondary | ICD-10-CM | POA: Diagnosis not present

## 2016-04-11 LAB — URINE CULTURE: Special Requests: NORMAL

## 2016-04-11 LAB — GLUCOSE, CAPILLARY: GLUCOSE-CAPILLARY: 195 mg/dL — AB (ref 65–99)

## 2016-04-12 DIAGNOSIS — R410 Disorientation, unspecified: Secondary | ICD-10-CM | POA: Diagnosis not present

## 2016-04-12 DIAGNOSIS — R4182 Altered mental status, unspecified: Secondary | ICD-10-CM | POA: Diagnosis not present

## 2016-04-12 LAB — URINALYSIS, COMPLETE (UACMP) WITH MICROSCOPIC
Bacteria, UA: NONE SEEN
Bilirubin Urine: NEGATIVE
Glucose, UA: NEGATIVE mg/dL
Hgb urine dipstick: NEGATIVE
KETONES UR: NEGATIVE mg/dL
LEUKOCYTES UA: NEGATIVE
Nitrite: NEGATIVE
PH: 5 (ref 5.0–8.0)
Protein, ur: NEGATIVE mg/dL
RBC / HPF: NONE SEEN RBC/hpf (ref 0–5)
SPECIFIC GRAVITY, URINE: 1.023 (ref 1.005–1.030)

## 2016-04-14 LAB — URINE CULTURE

## 2016-04-16 DIAGNOSIS — R4182 Altered mental status, unspecified: Secondary | ICD-10-CM | POA: Diagnosis not present

## 2016-04-16 DIAGNOSIS — R410 Disorientation, unspecified: Secondary | ICD-10-CM | POA: Diagnosis not present

## 2016-04-16 LAB — GLUCOSE, CAPILLARY
GLUCOSE-CAPILLARY: 136 mg/dL — AB (ref 65–99)
Glucose-Capillary: 137 mg/dL — ABNORMAL HIGH (ref 65–99)
Glucose-Capillary: 137 mg/dL — ABNORMAL HIGH (ref 65–99)

## 2016-04-19 DIAGNOSIS — R410 Disorientation, unspecified: Secondary | ICD-10-CM | POA: Diagnosis not present

## 2016-04-19 DIAGNOSIS — R4182 Altered mental status, unspecified: Secondary | ICD-10-CM | POA: Diagnosis not present

## 2016-04-19 LAB — GLUCOSE, CAPILLARY: GLUCOSE-CAPILLARY: 102 mg/dL — AB (ref 65–99)

## 2016-04-20 DIAGNOSIS — R410 Disorientation, unspecified: Secondary | ICD-10-CM | POA: Diagnosis not present

## 2016-04-20 DIAGNOSIS — R4182 Altered mental status, unspecified: Secondary | ICD-10-CM | POA: Diagnosis not present

## 2016-04-20 LAB — GLUCOSE, CAPILLARY: Glucose-Capillary: 148 mg/dL — ABNORMAL HIGH (ref 65–99)

## 2016-04-22 ENCOUNTER — Encounter
Admission: RE | Admit: 2016-04-22 | Discharge: 2016-04-22 | Disposition: A | Discharge status: Home / Self Care | Payer: Medicare Other | Source: Ambulatory Visit | Attending: Internal Medicine | Admitting: Internal Medicine

## 2016-04-22 DIAGNOSIS — F0280 Dementia in other diseases classified elsewhere without behavioral disturbance: Secondary | ICD-10-CM | POA: Insufficient documentation

## 2016-04-24 DIAGNOSIS — F0280 Dementia in other diseases classified elsewhere without behavioral disturbance: Secondary | ICD-10-CM | POA: Diagnosis not present

## 2016-04-24 LAB — GLUCOSE, CAPILLARY: Glucose-Capillary: 148 mg/dL — ABNORMAL HIGH (ref 65–99)

## 2016-04-25 DIAGNOSIS — F0280 Dementia in other diseases classified elsewhere without behavioral disturbance: Secondary | ICD-10-CM | POA: Diagnosis not present

## 2016-04-25 LAB — GLUCOSE, CAPILLARY: Glucose-Capillary: 129 mg/dL — ABNORMAL HIGH (ref 65–99)

## 2016-04-28 DIAGNOSIS — F0280 Dementia in other diseases classified elsewhere without behavioral disturbance: Secondary | ICD-10-CM | POA: Diagnosis not present

## 2016-04-28 LAB — GLUCOSE, CAPILLARY: Glucose-Capillary: 121 mg/dL — ABNORMAL HIGH (ref 65–99)

## 2016-04-29 DIAGNOSIS — F0280 Dementia in other diseases classified elsewhere without behavioral disturbance: Secondary | ICD-10-CM | POA: Diagnosis not present

## 2016-04-29 LAB — GLUCOSE, CAPILLARY: Glucose-Capillary: 132 mg/dL — ABNORMAL HIGH (ref 65–99)

## 2016-04-30 DIAGNOSIS — F0280 Dementia in other diseases classified elsewhere without behavioral disturbance: Secondary | ICD-10-CM | POA: Diagnosis not present

## 2016-04-30 LAB — GLUCOSE, CAPILLARY: GLUCOSE-CAPILLARY: 111 mg/dL — AB (ref 65–99)

## 2016-05-03 DIAGNOSIS — F0280 Dementia in other diseases classified elsewhere without behavioral disturbance: Secondary | ICD-10-CM | POA: Diagnosis not present

## 2016-05-03 LAB — GLUCOSE, CAPILLARY: Glucose-Capillary: 106 mg/dL — ABNORMAL HIGH (ref 65–99)

## 2016-05-04 DIAGNOSIS — F0280 Dementia in other diseases classified elsewhere without behavioral disturbance: Secondary | ICD-10-CM | POA: Diagnosis not present

## 2016-05-04 LAB — GLUCOSE, CAPILLARY: Glucose-Capillary: 107 mg/dL — ABNORMAL HIGH (ref 65–99)

## 2016-05-07 DIAGNOSIS — R262 Difficulty in walking, not elsewhere classified: Secondary | ICD-10-CM | POA: Diagnosis not present

## 2016-05-07 DIAGNOSIS — G309 Alzheimer's disease, unspecified: Secondary | ICD-10-CM | POA: Diagnosis not present

## 2016-05-07 DIAGNOSIS — Z9181 History of falling: Secondary | ICD-10-CM | POA: Diagnosis not present

## 2016-05-07 DIAGNOSIS — M6281 Muscle weakness (generalized): Secondary | ICD-10-CM | POA: Diagnosis not present

## 2016-05-08 DIAGNOSIS — R262 Difficulty in walking, not elsewhere classified: Secondary | ICD-10-CM | POA: Diagnosis not present

## 2016-05-08 DIAGNOSIS — M6281 Muscle weakness (generalized): Secondary | ICD-10-CM | POA: Diagnosis not present

## 2016-05-08 DIAGNOSIS — Z9181 History of falling: Secondary | ICD-10-CM | POA: Diagnosis not present

## 2016-05-08 DIAGNOSIS — G309 Alzheimer's disease, unspecified: Secondary | ICD-10-CM | POA: Diagnosis not present

## 2016-05-08 DIAGNOSIS — F0280 Dementia in other diseases classified elsewhere without behavioral disturbance: Secondary | ICD-10-CM | POA: Diagnosis not present

## 2016-05-08 LAB — GLUCOSE, CAPILLARY: GLUCOSE-CAPILLARY: 108 mg/dL — AB (ref 65–99)

## 2016-05-09 DIAGNOSIS — M6281 Muscle weakness (generalized): Secondary | ICD-10-CM | POA: Diagnosis not present

## 2016-05-09 DIAGNOSIS — Z9181 History of falling: Secondary | ICD-10-CM | POA: Diagnosis not present

## 2016-05-09 DIAGNOSIS — R262 Difficulty in walking, not elsewhere classified: Secondary | ICD-10-CM | POA: Diagnosis not present

## 2016-05-09 DIAGNOSIS — G309 Alzheimer's disease, unspecified: Secondary | ICD-10-CM | POA: Diagnosis not present

## 2016-05-09 DIAGNOSIS — F0280 Dementia in other diseases classified elsewhere without behavioral disturbance: Secondary | ICD-10-CM | POA: Diagnosis not present

## 2016-05-09 LAB — GLUCOSE, CAPILLARY: GLUCOSE-CAPILLARY: 109 mg/dL — AB (ref 65–99)

## 2016-05-10 DIAGNOSIS — G309 Alzheimer's disease, unspecified: Secondary | ICD-10-CM | POA: Diagnosis not present

## 2016-05-10 DIAGNOSIS — M6281 Muscle weakness (generalized): Secondary | ICD-10-CM | POA: Diagnosis not present

## 2016-05-10 DIAGNOSIS — Z9181 History of falling: Secondary | ICD-10-CM | POA: Diagnosis not present

## 2016-05-10 DIAGNOSIS — R262 Difficulty in walking, not elsewhere classified: Secondary | ICD-10-CM | POA: Diagnosis not present

## 2016-05-11 ENCOUNTER — Non-Acute Institutional Stay (SKILLED_NURSING_FACILITY): Payer: Medicare Other | Admitting: Gerontology

## 2016-05-11 DIAGNOSIS — Z9181 History of falling: Secondary | ICD-10-CM

## 2016-05-11 DIAGNOSIS — M1611 Unilateral primary osteoarthritis, right hip: Secondary | ICD-10-CM

## 2016-05-11 DIAGNOSIS — K219 Gastro-esophageal reflux disease without esophagitis: Secondary | ICD-10-CM

## 2016-05-11 DIAGNOSIS — M6281 Muscle weakness (generalized): Secondary | ICD-10-CM | POA: Diagnosis not present

## 2016-05-11 DIAGNOSIS — R41841 Cognitive communication deficit: Secondary | ICD-10-CM

## 2016-05-11 DIAGNOSIS — F29 Unspecified psychosis not due to a substance or known physiological condition: Secondary | ICD-10-CM

## 2016-05-11 DIAGNOSIS — R609 Edema, unspecified: Secondary | ICD-10-CM

## 2016-05-11 DIAGNOSIS — G309 Alzheimer's disease, unspecified: Secondary | ICD-10-CM | POA: Diagnosis not present

## 2016-05-11 DIAGNOSIS — G4700 Insomnia, unspecified: Secondary | ICD-10-CM

## 2016-05-11 DIAGNOSIS — J309 Allergic rhinitis, unspecified: Secondary | ICD-10-CM | POA: Diagnosis not present

## 2016-05-11 DIAGNOSIS — I10 Essential (primary) hypertension: Secondary | ICD-10-CM | POA: Diagnosis not present

## 2016-05-11 DIAGNOSIS — E119 Type 2 diabetes mellitus without complications: Secondary | ICD-10-CM | POA: Diagnosis not present

## 2016-05-11 DIAGNOSIS — R262 Difficulty in walking, not elsewhere classified: Secondary | ICD-10-CM | POA: Diagnosis not present

## 2016-05-11 DIAGNOSIS — G301 Alzheimer's disease with late onset: Secondary | ICD-10-CM

## 2016-05-11 DIAGNOSIS — B372 Candidiasis of skin and nail: Secondary | ICD-10-CM

## 2016-05-11 DIAGNOSIS — D649 Anemia, unspecified: Secondary | ICD-10-CM

## 2016-05-11 DIAGNOSIS — F0280 Dementia in other diseases classified elsewhere without behavioral disturbance: Secondary | ICD-10-CM | POA: Diagnosis not present

## 2016-05-11 DIAGNOSIS — E039 Hypothyroidism, unspecified: Secondary | ICD-10-CM

## 2016-05-11 DIAGNOSIS — J449 Chronic obstructive pulmonary disease, unspecified: Secondary | ICD-10-CM

## 2016-05-11 DIAGNOSIS — G2581 Restless legs syndrome: Secondary | ICD-10-CM

## 2016-05-11 DIAGNOSIS — N289 Disorder of kidney and ureter, unspecified: Secondary | ICD-10-CM

## 2016-05-11 DIAGNOSIS — D510 Vitamin B12 deficiency anemia due to intrinsic factor deficiency: Secondary | ICD-10-CM

## 2016-05-11 DIAGNOSIS — E7800 Pure hypercholesterolemia, unspecified: Secondary | ICD-10-CM

## 2016-05-11 DIAGNOSIS — T148XXA Other injury of unspecified body region, initial encounter: Secondary | ICD-10-CM

## 2016-05-11 DIAGNOSIS — R1312 Dysphagia, oropharyngeal phase: Secondary | ICD-10-CM

## 2016-05-11 DIAGNOSIS — K5900 Constipation, unspecified: Secondary | ICD-10-CM

## 2016-05-12 DIAGNOSIS — F0280 Dementia in other diseases classified elsewhere without behavioral disturbance: Secondary | ICD-10-CM | POA: Diagnosis not present

## 2016-05-12 LAB — GLUCOSE, CAPILLARY: Glucose-Capillary: 131 mg/dL — ABNORMAL HIGH (ref 65–99)

## 2016-05-13 DIAGNOSIS — F0280 Dementia in other diseases classified elsewhere without behavioral disturbance: Secondary | ICD-10-CM | POA: Diagnosis not present

## 2016-05-13 LAB — GLUCOSE, CAPILLARY: GLUCOSE-CAPILLARY: 134 mg/dL — AB (ref 65–99)

## 2016-05-14 DIAGNOSIS — F0280 Dementia in other diseases classified elsewhere without behavioral disturbance: Secondary | ICD-10-CM | POA: Diagnosis not present

## 2016-05-14 LAB — GLUCOSE, CAPILLARY: Glucose-Capillary: 119 mg/dL — ABNORMAL HIGH (ref 65–99)

## 2016-05-16 DIAGNOSIS — Z9181 History of falling: Secondary | ICD-10-CM | POA: Diagnosis not present

## 2016-05-16 DIAGNOSIS — M6281 Muscle weakness (generalized): Secondary | ICD-10-CM | POA: Diagnosis not present

## 2016-05-16 DIAGNOSIS — R262 Difficulty in walking, not elsewhere classified: Secondary | ICD-10-CM | POA: Diagnosis not present

## 2016-05-16 DIAGNOSIS — G309 Alzheimer's disease, unspecified: Secondary | ICD-10-CM | POA: Diagnosis not present

## 2016-05-17 DIAGNOSIS — G309 Alzheimer's disease, unspecified: Secondary | ICD-10-CM | POA: Diagnosis not present

## 2016-05-17 DIAGNOSIS — R262 Difficulty in walking, not elsewhere classified: Secondary | ICD-10-CM | POA: Diagnosis not present

## 2016-05-17 DIAGNOSIS — M6281 Muscle weakness (generalized): Secondary | ICD-10-CM | POA: Diagnosis not present

## 2016-05-17 DIAGNOSIS — F0280 Dementia in other diseases classified elsewhere without behavioral disturbance: Secondary | ICD-10-CM | POA: Diagnosis not present

## 2016-05-17 DIAGNOSIS — Z9181 History of falling: Secondary | ICD-10-CM | POA: Diagnosis not present

## 2016-05-17 LAB — GLUCOSE, CAPILLARY: Glucose-Capillary: 129 mg/dL — ABNORMAL HIGH (ref 65–99)

## 2016-05-18 DIAGNOSIS — G309 Alzheimer's disease, unspecified: Secondary | ICD-10-CM | POA: Diagnosis not present

## 2016-05-18 DIAGNOSIS — Z9181 History of falling: Secondary | ICD-10-CM | POA: Diagnosis not present

## 2016-05-18 DIAGNOSIS — F0280 Dementia in other diseases classified elsewhere without behavioral disturbance: Secondary | ICD-10-CM | POA: Diagnosis not present

## 2016-05-18 DIAGNOSIS — M6281 Muscle weakness (generalized): Secondary | ICD-10-CM | POA: Diagnosis not present

## 2016-05-18 DIAGNOSIS — R262 Difficulty in walking, not elsewhere classified: Secondary | ICD-10-CM | POA: Diagnosis not present

## 2016-05-18 LAB — GLUCOSE, CAPILLARY: Glucose-Capillary: 103 mg/dL — ABNORMAL HIGH (ref 65–99)

## 2016-05-21 ENCOUNTER — Other Ambulatory Visit
Admission: RE | Admit: 2016-05-21 | Discharge: 2016-05-21 | Disposition: A | Discharge status: Home / Self Care | Payer: Medicare Other | Source: Ambulatory Visit | Attending: Gerontology | Admitting: Gerontology

## 2016-05-21 DIAGNOSIS — F0280 Dementia in other diseases classified elsewhere without behavioral disturbance: Secondary | ICD-10-CM | POA: Insufficient documentation

## 2016-05-21 LAB — CBC WITH DIFFERENTIAL/PLATELET
Basophils Absolute: 0.1 10*3/uL (ref 0–0.1)
Basophils Relative: 1 %
EOS ABS: 0.3 10*3/uL (ref 0–0.7)
Eosinophils Relative: 3 %
HEMATOCRIT: 44.2 % (ref 35.0–47.0)
HEMOGLOBIN: 14.3 g/dL (ref 12.0–16.0)
LYMPHS ABS: 2.2 10*3/uL (ref 1.0–3.6)
LYMPHS PCT: 25 %
MCH: 29.5 pg (ref 26.0–34.0)
MCHC: 32.3 g/dL (ref 32.0–36.0)
MCV: 91.2 fL (ref 80.0–100.0)
MONOS PCT: 7 %
Monocytes Absolute: 0.6 10*3/uL (ref 0.2–0.9)
NEUTROS PCT: 64 %
Neutro Abs: 5.7 10*3/uL (ref 1.4–6.5)
Platelets: 292 10*3/uL (ref 150–440)
RBC: 4.85 MIL/uL (ref 3.80–5.20)
RDW: 13.8 % (ref 11.5–14.5)
WBC: 8.9 10*3/uL (ref 3.6–11.0)

## 2016-05-21 LAB — TSH: TSH: 1.359 u[IU]/mL (ref 0.350–4.500)

## 2016-05-21 LAB — COMPREHENSIVE METABOLIC PANEL
ALK PHOS: 47 U/L (ref 38–126)
ALT: 18 U/L (ref 14–54)
AST: 25 U/L (ref 15–41)
Albumin: 3.9 g/dL (ref 3.5–5.0)
Anion gap: 12 (ref 5–15)
BILIRUBIN TOTAL: 0.5 mg/dL (ref 0.3–1.2)
BUN: 23 mg/dL — AB (ref 6–20)
CALCIUM: 9.1 mg/dL (ref 8.9–10.3)
CO2: 25 mmol/L (ref 22–32)
CREATININE: 0.96 mg/dL (ref 0.44–1.00)
Chloride: 105 mmol/L (ref 101–111)
GFR, EST NON AFRICAN AMERICAN: 54 mL/min — AB (ref >60)
Glucose, Bld: 158 mg/dL — ABNORMAL HIGH (ref 65–99)
Potassium: 3.7 mmol/L (ref 3.5–5.1)
Sodium: 142 mmol/L (ref 135–145)
TOTAL PROTEIN: 6.7 g/dL (ref 6.5–8.1)

## 2016-05-21 LAB — MAGNESIUM: MAGNESIUM: 1.7 mg/dL (ref 1.7–2.4)

## 2016-05-21 LAB — VITAMIN B12: VITAMIN B 12: 748 pg/mL (ref 180–914)

## 2016-05-22 ENCOUNTER — Encounter
Admission: RE | Admit: 2016-05-22 | Discharge: 2016-05-22 | Disposition: A | Discharge status: Home / Self Care | Payer: Medicare Other | Source: Ambulatory Visit | Attending: Internal Medicine | Admitting: Internal Medicine

## 2016-05-22 DIAGNOSIS — R410 Disorientation, unspecified: Secondary | ICD-10-CM | POA: Diagnosis not present

## 2016-05-22 DIAGNOSIS — Z9181 History of falling: Secondary | ICD-10-CM | POA: Diagnosis not present

## 2016-05-22 DIAGNOSIS — M6281 Muscle weakness (generalized): Secondary | ICD-10-CM | POA: Diagnosis not present

## 2016-05-22 DIAGNOSIS — R262 Difficulty in walking, not elsewhere classified: Secondary | ICD-10-CM | POA: Diagnosis not present

## 2016-05-22 LAB — VITAMIN D 25 HYDROXY (VIT D DEFICIENCY, FRACTURES): VIT D 25 HYDROXY: 47.1 ng/mL (ref 30.0–100.0)

## 2016-05-22 LAB — HEMOGLOBIN A1C
Hgb A1c MFr Bld: 6.8 % — ABNORMAL HIGH (ref 4.8–5.6)
MEAN PLASMA GLUCOSE: 148 mg/dL

## 2016-05-23 DIAGNOSIS — M6281 Muscle weakness (generalized): Secondary | ICD-10-CM | POA: Diagnosis not present

## 2016-05-23 DIAGNOSIS — Z9181 History of falling: Secondary | ICD-10-CM | POA: Diagnosis not present

## 2016-05-23 DIAGNOSIS — R262 Difficulty in walking, not elsewhere classified: Secondary | ICD-10-CM | POA: Diagnosis not present

## 2016-05-24 LAB — GLUCOSE, CAPILLARY
GLUCOSE-CAPILLARY: 214 mg/dL — AB (ref 65–99)
GLUCOSE-CAPILLARY: 137 mg/dL — AB (ref 65–99)

## 2016-05-25 DIAGNOSIS — Z9181 History of falling: Secondary | ICD-10-CM | POA: Diagnosis not present

## 2016-05-25 DIAGNOSIS — R262 Difficulty in walking, not elsewhere classified: Secondary | ICD-10-CM | POA: Diagnosis not present

## 2016-05-25 DIAGNOSIS — M6281 Muscle weakness (generalized): Secondary | ICD-10-CM | POA: Diagnosis not present

## 2016-05-28 DIAGNOSIS — R410 Disorientation, unspecified: Secondary | ICD-10-CM | POA: Diagnosis not present

## 2016-05-28 DIAGNOSIS — R262 Difficulty in walking, not elsewhere classified: Secondary | ICD-10-CM | POA: Diagnosis not present

## 2016-05-28 DIAGNOSIS — M6281 Muscle weakness (generalized): Secondary | ICD-10-CM | POA: Diagnosis not present

## 2016-05-28 DIAGNOSIS — Z9181 History of falling: Secondary | ICD-10-CM | POA: Diagnosis not present

## 2016-05-28 LAB — GLUCOSE, CAPILLARY
GLUCOSE-CAPILLARY: 122 mg/dL — AB (ref 65–99)
GLUCOSE-CAPILLARY: 144 mg/dL — AB (ref 65–99)
Glucose-Capillary: 140 mg/dL — ABNORMAL HIGH (ref 65–99)

## 2016-05-29 DIAGNOSIS — R262 Difficulty in walking, not elsewhere classified: Secondary | ICD-10-CM | POA: Diagnosis not present

## 2016-05-29 DIAGNOSIS — M6281 Muscle weakness (generalized): Secondary | ICD-10-CM | POA: Diagnosis not present

## 2016-05-29 DIAGNOSIS — Z9181 History of falling: Secondary | ICD-10-CM | POA: Diagnosis not present

## 2016-05-30 DIAGNOSIS — Z9181 History of falling: Secondary | ICD-10-CM | POA: Diagnosis not present

## 2016-05-30 DIAGNOSIS — M6281 Muscle weakness (generalized): Secondary | ICD-10-CM | POA: Diagnosis not present

## 2016-05-30 DIAGNOSIS — R262 Difficulty in walking, not elsewhere classified: Secondary | ICD-10-CM | POA: Diagnosis not present

## 2016-05-31 DIAGNOSIS — Z9181 History of falling: Secondary | ICD-10-CM | POA: Diagnosis not present

## 2016-05-31 DIAGNOSIS — R262 Difficulty in walking, not elsewhere classified: Secondary | ICD-10-CM | POA: Diagnosis not present

## 2016-05-31 DIAGNOSIS — M6281 Muscle weakness (generalized): Secondary | ICD-10-CM | POA: Diagnosis not present

## 2016-05-31 DIAGNOSIS — R410 Disorientation, unspecified: Secondary | ICD-10-CM | POA: Diagnosis not present

## 2016-05-31 LAB — GLUCOSE, CAPILLARY: Glucose-Capillary: 112 mg/dL — ABNORMAL HIGH (ref 65–99)

## 2016-06-01 DIAGNOSIS — R410 Disorientation, unspecified: Secondary | ICD-10-CM | POA: Diagnosis not present

## 2016-06-01 LAB — GLUCOSE, CAPILLARY: Glucose-Capillary: 130 mg/dL — ABNORMAL HIGH (ref 65–99)

## 2016-06-04 DIAGNOSIS — M6281 Muscle weakness (generalized): Secondary | ICD-10-CM | POA: Diagnosis not present

## 2016-06-04 DIAGNOSIS — R410 Disorientation, unspecified: Secondary | ICD-10-CM | POA: Diagnosis not present

## 2016-06-04 DIAGNOSIS — R262 Difficulty in walking, not elsewhere classified: Secondary | ICD-10-CM | POA: Diagnosis not present

## 2016-06-04 DIAGNOSIS — Z9181 History of falling: Secondary | ICD-10-CM | POA: Diagnosis not present

## 2016-06-04 LAB — GLUCOSE, CAPILLARY
GLUCOSE-CAPILLARY: 117 mg/dL — AB (ref 65–99)
GLUCOSE-CAPILLARY: 118 mg/dL — AB (ref 65–99)
GLUCOSE-CAPILLARY: 153 mg/dL — AB (ref 65–99)
Glucose-Capillary: 177 mg/dL — ABNORMAL HIGH (ref 65–99)

## 2016-06-05 DIAGNOSIS — Z9181 History of falling: Secondary | ICD-10-CM | POA: Insufficient documentation

## 2016-06-05 DIAGNOSIS — B372 Candidiasis of skin and nail: Secondary | ICD-10-CM | POA: Insufficient documentation

## 2016-06-05 DIAGNOSIS — M1990 Unspecified osteoarthritis, unspecified site: Secondary | ICD-10-CM | POA: Insufficient documentation

## 2016-06-05 DIAGNOSIS — R609 Edema, unspecified: Secondary | ICD-10-CM | POA: Insufficient documentation

## 2016-06-05 DIAGNOSIS — G309 Alzheimer's disease, unspecified: Secondary | ICD-10-CM

## 2016-06-05 DIAGNOSIS — E039 Hypothyroidism, unspecified: Secondary | ICD-10-CM | POA: Insufficient documentation

## 2016-06-05 DIAGNOSIS — K219 Gastro-esophageal reflux disease without esophagitis: Secondary | ICD-10-CM | POA: Insufficient documentation

## 2016-06-05 DIAGNOSIS — J309 Allergic rhinitis, unspecified: Secondary | ICD-10-CM | POA: Insufficient documentation

## 2016-06-05 DIAGNOSIS — R41841 Cognitive communication deficit: Secondary | ICD-10-CM | POA: Insufficient documentation

## 2016-06-05 DIAGNOSIS — R1312 Dysphagia, oropharyngeal phase: Secondary | ICD-10-CM | POA: Insufficient documentation

## 2016-06-05 DIAGNOSIS — F29 Unspecified psychosis not due to a substance or known physiological condition: Secondary | ICD-10-CM | POA: Insufficient documentation

## 2016-06-05 DIAGNOSIS — D649 Anemia, unspecified: Secondary | ICD-10-CM | POA: Insufficient documentation

## 2016-06-05 DIAGNOSIS — M6281 Muscle weakness (generalized): Secondary | ICD-10-CM | POA: Diagnosis not present

## 2016-06-05 DIAGNOSIS — G4700 Insomnia, unspecified: Secondary | ICD-10-CM | POA: Insufficient documentation

## 2016-06-05 DIAGNOSIS — R410 Disorientation, unspecified: Secondary | ICD-10-CM | POA: Diagnosis not present

## 2016-06-05 DIAGNOSIS — G2581 Restless legs syndrome: Secondary | ICD-10-CM | POA: Insufficient documentation

## 2016-06-05 DIAGNOSIS — K5900 Constipation, unspecified: Secondary | ICD-10-CM | POA: Insufficient documentation

## 2016-06-05 DIAGNOSIS — R262 Difficulty in walking, not elsewhere classified: Secondary | ICD-10-CM | POA: Diagnosis not present

## 2016-06-05 DIAGNOSIS — F0280 Dementia in other diseases classified elsewhere without behavioral disturbance: Secondary | ICD-10-CM | POA: Insufficient documentation

## 2016-06-05 DIAGNOSIS — D510 Vitamin B12 deficiency anemia due to intrinsic factor deficiency: Secondary | ICD-10-CM | POA: Insufficient documentation

## 2016-06-05 DIAGNOSIS — T148XXA Other injury of unspecified body region, initial encounter: Secondary | ICD-10-CM | POA: Insufficient documentation

## 2016-06-05 LAB — GLUCOSE, CAPILLARY: Glucose-Capillary: 132 mg/dL — ABNORMAL HIGH (ref 65–99)

## 2016-06-05 NOTE — Progress Notes (Signed)
Location:      Place of Service:  SNF (31) Provider:  Toni Arthurs, NP-C  Kirk Ruths, MD  Patient Care Team: Kirk Ruths, MD as PCP - General (Internal Medicine) Einar Pheasant, MD (Internal Medicine)  Extended Emergency Contact Information Primary Emergency Contact: Duwayne Heck Address: [address]          [place], [place] [address] [name] of [place] Phone: [phone] Relation: None Secondary Emergency Contact: [name] Address: [address]          [place], [place] [address] Home Phone: [phone] Relation: None  Code Status:  DO NOT RESUSCITATE Goals of care: Advanced Directive information Advanced Directives 02/05/2015  Does Patient Have a Medical Advance Directive? Yes  Type of Advance Directive [place]  Does patient want to make changes to medical advance directive? No - Patient declined  Copy of [place] in Chart? Yes     Chief Complaint  Patient presents with  . Acute Visit  . Medical Management of Chronic Issues    HPI:  Pt is a 83 y.o. female seen today for medical management of chronic diseases. Patient has been stable over the past month. She continues to have a very slow decline in cognition and mobility. She spends the majority of her day in a recliner making her pot holders. Patient is very slow to speak-responded now. However, she always has a big smile on her face when spoken to. She tries to communicate, but her words are mostly unintelligible now. When asked directly, patient denies pain or any other negative symptoms. Patient reports she is doing well. No falls this past month. Spoke to daughter, Lattie Haw, at length about patient's slow decline. Lattie Haw is agreeable to palliative care consult for symptom management and monitoring. Staff also reports some redness and tenderness to the left great toe. There is no break in skin integrity, however the tissue is pink. No warmth, no nail  discoloration, no drainage. Red coloring is mostly at the tip of toe. No known history of gout. Vital signs stable. No other complaints.   Past Medical History:  Diagnosis Date  . Anemia   . Atrial fibrillation ([place])    post op, converted to NSR wtih amiodarone  . Bladder cancer ([place]) 2003   s/p resection and chemotherapy, followed by Dr Jacqlyn Larsen  . COPD (chronic obstructive pulmonary disease) ([place])   . Dementia   . Diabetes mellitus ([place])   . Diabetes mellitus without complication ([place])   . GERD (gastroesophageal reflux disease)   . Hypercholesterolemia   . Hyperlipemia   . Hypertension   . Hypothyroidism   . Inner ear disease   . Lung cancer ([place])    squamous cell s/p left upper lobe llingulectomy 12/08/04 - Dr Arlyce Dice   Past Surgical History:  Procedure Laterality Date  . BLADDER SURGERY  2003   for bladder cancer - Dr Jacqlyn Larsen  . CHOLECYSTECTOMY  1986  . LUNG CANCER SURGERY  12/08/04   s/p left upper lobe lingulectomy - Dr Arlyce Dice    Allergies  Allergen Reactions  . Lovenox [Enoxaparin Sodium]   . No Known Drug Allergy   . Novolog [Insulin Aspart]     Allergies as of 05/11/2016      Reactions   Lovenox [enoxaparin Sodium]    No Known Drug Allergy    Novolog [insulin Aspart]       Medication List       Accurate as of 05/11/16  11:59 PM. Always use your most recent med list.          acetaminophen 650 MG CR tablet Commonly known as:  TYLENOL Take 650 mg by mouth every 12 (twelve) hours.   acetaminophen 325 MG tablet Commonly known as:  TYLENOL Take 650 mg by mouth every 4 (four) hours as needed for moderate pain or fever.   albuterol (2.5 MG/3ML) 0.083% nebulizer solution Commonly known as:  PROVENTIL Take 3 mLs (2.5 mg total) by nebulization every 6 (six) hours.   aspirin 81 MG tablet Take 81 mg by mouth daily.   bisacodyl 10 MG suppository Commonly known as:  DULCOLAX Place 10 mg rectally daily as needed for mild constipation or moderate constipation. *If  Milk of Magnesia does not resolve constipation.*   budesonide 0.25 MG/2ML nebulizer solution Commonly known as:  PULMICORT Take 2 mLs (0.25 mg total) by nebulization 2 (two) times daily.   calcium-vitamin D 500-200 MG-UNIT tablet Commonly known as:  OSCAL WITH D Take 1 tablet by mouth 2 (two) times daily.   camphor-menthol lotion Commonly known as:  SARNA Apply 1 application topically as needed for itching.   cetirizine 5 MG tablet Commonly known as:  ZYRTEC Take 5 mg by mouth at bedtime as needed for allergies.   cholecalciferol 1000 units tablet Commonly known as:  VITAMIN D Take 1,000 Units by mouth daily.   docusate sodium 100 MG capsule Commonly known as:  COLACE Take 100 mg by mouth 2 (two) times daily as needed for mild constipation or moderate constipation.   docusate sodium 100 MG capsule Commonly known as:  COLACE TAKE ONE CAPSULE BY MOUTH TWICE A DAY   Fluticasone-Salmeterol 250-50 MCG/DOSE Aepb Commonly known as:  ADVAIR DISKUS Inhale 1 puff into the lungs 2 (two) times daily.   folic acid 094 MCG tablet Commonly known as:  FOLVITE Take 400 mcg by mouth daily.   glimepiride 2 MG tablet Commonly known as:  AMARYL Take 3 mg by mouth 2 (two) times daily.   glimepiride 2 MG tablet Commonly known as:  AMARYL Take 2 mg by mouth daily.   guaifenesin 100 MG/5ML syrup Commonly known as:  ROBITUSSIN Take 200 mg by mouth every 4 (four) hours as needed for cough. *notify MD if symptoms continue more than 48 hours hours.   HYDROcodone-acetaminophen 5-325 MG tablet Commonly known as:  NORCO/VICODIN Take 1 tablet by mouth every 4 (four) hours as needed for moderate pain or severe pain.   Insulin Pen Needle 32G X 4 MM Misc Commonly known as:  CLICKFINE PEN NEEDLES Novolog FlexPen Needles-use twice a day   insulin regular 100 units/mL injection Commonly known as:  NOVOLIN R,HUMULIN R Inject into the skin 3 (three) times daily before meals. Sliding scale.     loperamide 2 MG tablet Commonly known as:  IMODIUM A-D Take 2-4 mg by mouth See admin instructions. Take 2 tablets ('4mg'$ ) by mouth for initial loose stool, then take 1 tablet by mouth after each subsequent loose stool as needed up to 8 doses in 24 hours.   losartan 50 MG tablet Commonly known as:  COZAAR Take 50 mg by mouth daily.   losartan 100 MG tablet Commonly known as:  COZAAR Take 100 mg by mouth daily.   lovastatin 20 MG tablet Commonly known as:  MEVACOR Take 20 mg by mouth at bedtime.   magnesium hydroxide 400 MG/5ML suspension Commonly known as:  MILK OF MAGNESIA Take 30 mLs by mouth daily as  needed for mild constipation. *no results in 24 hours may administer bisacodyl suppository*   metFORMIN 500 MG tablet Commonly known as:  GLUCOPHAGE Take 500 mg by mouth 2 (two) times daily with a meal.   metoprolol succinate 25 MG 24 hr tablet Commonly known as:  TOPROL-XL Take 25 mg by mouth daily.   metoprolol succinate 50 MG 24 hr tablet Commonly known as:  TOPROL-XL Take 1 tablet (50 mg total) by mouth daily. Take with or immediately following a meal.   montelukast 10 MG tablet Commonly known as:  SINGULAIR Take 10 mg by mouth at bedtime.   nystatin cream Commonly known as:  MYCOSTATIN Apply topically 2 (two) times daily.   nystatin powder Commonly known as:  MYCOSTATIN/NYSTOP Apply topically 2 (two) times daily.   omeprazole 20 MG capsule Commonly known as:  PRILOSEC Take 20 mg by mouth daily.   rOPINIRole 0.25 MG tablet Commonly known as:  REQUIP Take 1 tablet (0.25 mg total) by mouth at bedtime.   senna 8.6 MG Tabs tablet Commonly known as:  SENOKOT Take 8.6 mg by mouth 2 (two) times daily.   tiotropium 18 MCG inhalation capsule Commonly known as:  SPIRIVA Place 18 mcg into inhaler and inhale daily.   tiotropium 18 MCG inhalation capsule Commonly known as:  SPIRIVA Place 1 capsule (18 mcg total) into inhaler and inhale daily.   traMADol 50 MG  tablet Commonly known as:  ULTRAM Take 50 mg by mouth every 6 (six) hours as needed for moderate pain or severe pain.   triamcinolone cream 0.1 % Commonly known as:  KENALOG APPLY TOPICALLY TWICE A DAY   trolamine salicylate 10 % cream Commonly known as:  ASPERCREME Apply 1 application topically 4 (four) times daily as needed for muscle pain. Apply and massage on the right hip/outer thigh.   vitamin B-12 250 MCG tablet Commonly known as:  CYANOCOBALAMIN Take 250 mcg by mouth daily.       Review of Systems  Unable to perform ROS: Dementia  Constitutional: Negative.   HENT: Negative for congestion, postnasal drip, rhinorrhea, sinus pressure, trouble swallowing and voice change.   Eyes: Negative.   Respiratory: Negative for cough, choking, chest tightness, shortness of breath and wheezing.   Cardiovascular: Negative for chest pain and leg swelling.  Gastrointestinal: Negative.   Genitourinary: Negative.   Musculoskeletal: Negative.   Skin: Positive for color change.  Neurological: Positive for speech difficulty.  Psychiatric/Behavioral: Positive for confusion.  All other systems reviewed and are negative.   Immunization History  Administered Date(s) Administered  . Influenza Split 12/05/2011   Pertinent  Health Maintenance Due  Topic Date Due  . FOOT EXAM  12/05/1943  . OPHTHALMOLOGY EXAM  12/05/1943  . DEXA SCAN  12/05/2000  . PNA vac Low Risk Adult (1 of 2 - PCV13) 12/05/2000  . INFLUENZA VACCINE  08/22/2016  . HEMOGLOBIN A1C  11/20/2016   No flowsheet data found. Functional Status Survey:    Vitals:   05/02/16 1200  BP: (!) 116/53  Pulse: 66  Resp: 20  Temp: 97.8 F (36.6 C)  SpO2: 97%  Weight: 133 lb (60.3 kg)   Body mass index is 22.13 kg/m. Physical Exam  Constitutional: She is oriented to person, place, and time. She appears well-developed and well-nourished. No distress.  HENT:  Head: Normocephalic and atraumatic.  Mouth/Throat: Oropharynx is  clear and moist.  Eyes: Conjunctivae and EOM are normal. Pupils are equal, round, and reactive to light.  Neck: Normal  range of motion. Neck supple. No JVD present.  Cardiovascular: Normal rate, regular rhythm, normal heart sounds and intact distal pulses.  Exam reveals no gallop and no friction rub.   No murmur heard. Pulmonary/Chest: Effort normal and breath sounds normal. No respiratory distress. She has no wheezes. She has no rales. She exhibits no tenderness.  Abdominal: Soft. Bowel sounds are normal. She exhibits no distension. There is no tenderness. There is no rebound and no guarding.  Lymphadenopathy:    She has no cervical adenopathy.  Neurological: She is alert and oriented to person, place, and time.  Skin: Skin is warm and dry. No rash noted. She is not diaphoretic. There is erythema. No pallor.     Psychiatric: She has a normal mood and affect. Her behavior is normal. Judgment and thought content normal.  Nursing note and vitals reviewed.   Labs reviewed:  Recent Labs  10/21/15 1850 05/21/16 1300  NA 141 142  K 4.0 3.7  CL 107 105  CO2 27 25  GLUCOSE 101* 158*  BUN 22* 23*  CREATININE 0.93 0.96  CALCIUM 9.4 9.1  MG 1.5* 1.7    Recent Labs  10/21/15 1850 05/21/16 1300  AST 22 25  ALT 18 18  ALKPHOS 44 47  BILITOT 0.6 0.5  PROT 6.9 6.7  ALBUMIN 4.0 3.9    Recent Labs  10/21/15 1850 05/21/16 1300  WBC 10.4 8.9  NEUTROABS 5.3 5.7  HGB 14.3 14.3  HCT 42.9 44.2  MCV 90.6 91.2  PLT 294 292   Lab Results  Component Value Date   TSH 1.359 05/21/2016   Lab Results  Component Value Date   HGBA1C 6.8 (H) 05/21/2016   Lab Results  Component Value Date   CHOL 170 10/21/2015   HDL 40 (L) 10/21/2015   LDLCALC 92 10/21/2015   TRIG 188 (H) 10/21/2015   CHOLHDL 4.3 10/21/2015    Significant Diagnostic Results in last 30 days:  No results found.  Assessment/Plan 1. Late onset Alzheimer's disease without behavioral disturbance 2. Dementia  associated with other underlying disease without behavioral disturbance  Stable  Being followed by palliative care  3. Hx of falling  Stable  4. Anemia, unspecified type  Stable  5. Essential (primary) hypertension  Stable  Continue losartan 50 mg by mouth daily  Continue metoprolol ER 25 mg tablet by mouth daily  6. Chronic obstructive pulmonary disease, unspecified COPD type (HCC)  Stable  Continue Advair HFA 115-67mg 2 puffs every 12 hours for COPD  Continue Singulair 10 mg one tablet by mouth daily at bedtime  Continue Spiriva 18 g 1 capsule inhaled once daily   7. Type 2 diabetes mellitus without complication, without long-term current use of insulin (HCC)  Stable  Continue glimepiride 2 mg tablets 1-1/2 tabs by mouth twice a day  Continue metformin 500 mg by mouth twice a day  8. Gastroesophageal reflux disease without esophagitis  Stable  Continue omeprazole 20 mg by mouth daily  9. Pure hypercholesterolemia  Stable  10. Hypothyroidism, unspecified type  Stable  Monitoring  11. Constipation, unspecified constipation type  Stable  Milk of magnesia 30 mL by mouth every 4 hours when necessary constipation  Bisacodyl suppository 10 mg per rectum daily when necessary  12. Allergic rhinitis, unspecified seasonality, unspecified trigger  Stable  Continue cetirizine 5 mg by mouth daily at bedtime  13. Unsp psychosis not due to a substance or known physiol cond  Stable  14. Dysphagia, oropharyngeal phase  Stable  15. Vitamin B12 deficiency anemia due to intrinsic factor deficiency  Stable  Continue folic acid 678 g by mouth daily  Continue cyanocobalamin 250 g by mouth daily  16. Osteoarthritis of right hip, unspecified osteoarthritis type  Stable  Continue Tylenol 650 mg by mouth 3 times a day  Continue cholecalciferol 1000 units by mouth daily  Continue Os-Cal 500+ D31 tab by mouth twice a day  Continue gabapentin 300  mg by mouth daily at bedtime for pain  Continue gabapentin 100 mg by mouth daily pain  Continue hydrocodone-acetaminophen 5-3 25 mg 1 tablet by mouth every 4 hours when necessary pain  17. Cognitive communication deficit  Stable  18. Insomnia, unspecified type  Stable  Melatonin 3 mg tablets 2 tablets by mouth daily at bedtime  19. Edema, unspecified type  Stable   Continue torsemide 5 mg tablet by mouth daily  20. Hypomagnesemia  Stable  Attending magnesium oxide 250 mg tablet 1 tablet by mouth daily  21. Renal insufficiency  Stable  Renally adjust medications as appropriate  22. RLS (restless legs syndrome)  Stable  Continue ropinirole 0.25 mg by mouth daily at bedtime  23. Friction injury to skin  Skin prep to B- Great toes BID  Skin Prep to B-heels BID  24. Candidal intertrigo  Stable  Continue nystatin powder 100,000 units/gram apply thin film of powder under breasts twice a day until healed   Family/ staff Communication:   Total Time: 40 minutes  Documentation: 10 minutes  Face to Face: 20 minutes  Family/Phone: spoke with daughter in hallway 10 minutes   Labs/tests ordered:  Cbc, met c, tsh, b12, a1c  Medication list reviewed and assessed for continued appropriateness. Monthly medication orders reviewed and signed.  Vikki Ports, NP-C Geriatrics [facility] [phone], [place] [address] Cell Phone (Mon-Fri 8am-5pm):  [phone] On Call:  [phone] & follow prompts after 5pm & weekends Office Phone:  [phone] Office Fax:  [phone]

## 2016-06-06 ENCOUNTER — Non-Acute Institutional Stay (SKILLED_NURSING_FACILITY): Payer: Medicare Other | Admitting: Gerontology

## 2016-06-06 DIAGNOSIS — S51011A Laceration without foreign body of right elbow, initial encounter: Secondary | ICD-10-CM | POA: Diagnosis not present

## 2016-06-06 DIAGNOSIS — Z9181 History of falling: Secondary | ICD-10-CM | POA: Diagnosis not present

## 2016-06-06 DIAGNOSIS — R262 Difficulty in walking, not elsewhere classified: Secondary | ICD-10-CM | POA: Diagnosis not present

## 2016-06-06 DIAGNOSIS — R410 Disorientation, unspecified: Secondary | ICD-10-CM | POA: Diagnosis not present

## 2016-06-06 DIAGNOSIS — M6281 Muscle weakness (generalized): Secondary | ICD-10-CM | POA: Diagnosis not present

## 2016-06-06 LAB — URINALYSIS, COMPLETE (UACMP) WITH MICROSCOPIC
BILIRUBIN URINE: NEGATIVE
GLUCOSE, UA: NEGATIVE mg/dL
HGB URINE DIPSTICK: NEGATIVE
Ketones, ur: NEGATIVE mg/dL
LEUKOCYTES UA: NEGATIVE
NITRITE: NEGATIVE
PROTEIN: NEGATIVE mg/dL
SPECIFIC GRAVITY, URINE: 1.02 (ref 1.005–1.030)
pH: 5 (ref 5.0–8.0)

## 2016-06-06 LAB — GLUCOSE, CAPILLARY: Glucose-Capillary: 120 mg/dL — ABNORMAL HIGH (ref 65–99)

## 2016-06-07 DIAGNOSIS — R262 Difficulty in walking, not elsewhere classified: Secondary | ICD-10-CM | POA: Diagnosis not present

## 2016-06-07 DIAGNOSIS — M6281 Muscle weakness (generalized): Secondary | ICD-10-CM | POA: Diagnosis not present

## 2016-06-07 DIAGNOSIS — Z9181 History of falling: Secondary | ICD-10-CM | POA: Diagnosis not present

## 2016-06-08 LAB — URINE CULTURE

## 2016-06-11 DIAGNOSIS — R410 Disorientation, unspecified: Secondary | ICD-10-CM | POA: Diagnosis not present

## 2016-06-11 LAB — GLUCOSE, CAPILLARY
GLUCOSE-CAPILLARY: 164 mg/dL — AB (ref 65–99)
GLUCOSE-CAPILLARY: 127 mg/dL — AB (ref 65–99)
Glucose-Capillary: 111 mg/dL — ABNORMAL HIGH (ref 65–99)

## 2016-06-12 NOTE — Progress Notes (Signed)
Location:      Place of Service:  SNF (31) Provider:  Toni Arthurs, NP-C  Kirk Ruths, MD  Patient Care Team: Kirk Ruths, MD as PCP - General (Internal Medicine) Einar Pheasant, MD (Internal Medicine)  Extended Emergency Contact Information Primary Emergency Contact: Duwayne Heck Address: [address]          [place], [place] [address] [name] of [place] Phone: [phone] Relation: None Secondary Emergency Contact: [name] Address: [address]          [place], [place] [address] Home Phone: [phone] Relation: None  Code Status:  DNR Goals of care: Advanced Directive information Advanced Directives 02/05/2015  Does Patient Have a Medical Advance Directive? Yes  Type of Advance Directive [place]  Does patient want to make changes to medical advance directive? No - Patient declined  Copy of [place] in Chart? Yes     Chief Complaint  Patient presents with  . Acute Visit    HPI:  Pt is a 83 y.o. female seen today for an acute visit for increased confusion. Daughter walked pt to the bathroom yesterday and noticed the urine was dark and foul smelling. Pt has began having increased confusion, appears to have less recognition of her family. Appetite decreased some. Please note, pt with dementia and minimally verbal at baseline. Unable to obtain complete ROS. Also, pt obtained skin tear 2 days ago when she hit her elbow in the doorway while being rolled back into her room after an activity. Nursing had covered the area with gauze and tegaderm after applying steri-strips. Area appears to have some redness and drainage. Afebrile. VSS. No other complaints.    Past Medical History:  Diagnosis Date  . Anemia   . Atrial fibrillation ([place] [place])    post op, converted to NSR wtih amiodarone  . Bladder cancer ([facility]) 2003   s/p resection and chemotherapy, followed by Dr Jacqlyn Larsen  . COPD (chronic obstructive pulmonary  disease) ([place])   . Dementia   . Diabetes mellitus ([facility] [place])   . Diabetes mellitus without complication ([place])   . GERD (gastroesophageal reflux disease)   . Hypercholesterolemia   . Hyperlipemia   . Hypertension   . Hypothyroidism   . Inner ear disease   . Lung cancer ([place])    squamous cell s/p left upper lobe llingulectomy 12/08/04 - Dr Arlyce Dice   Past Surgical History:  Procedure Laterality Date  . BLADDER SURGERY  2003   for bladder cancer - Dr Jacqlyn Larsen  . CHOLECYSTECTOMY  1986  . LUNG CANCER SURGERY  12/08/04   s/p left upper lobe lingulectomy - Dr Arlyce Dice    Allergies  Allergen Reactions  . Lovenox [Enoxaparin Sodium]   . No Known Drug Allergy   . Novolog [Insulin Aspart]     Allergies as of 06/06/2016      Reactions   Lovenox [enoxaparin Sodium]    No Known Drug Allergy    Novolog [insulin Aspart]       Medication List       Accurate as of 06/06/16 11:59 PM. Always use your most recent med list.          acetaminophen 650 MG CR tablet Commonly known as:  TYLENOL Take 650 mg by mouth every 12 (twelve) hours.   acetaminophen 325 MG tablet Commonly known as:  TYLENOL Take 650 mg by mouth every 4 (four) hours as needed for moderate pain or fever.   albuterol (2.5  MG/3ML) 0.083% nebulizer solution Commonly known as:  PROVENTIL Take 3 mLs (2.5 mg total) by nebulization every 6 (six) hours.   aspirin 81 MG tablet Take 81 mg by mouth daily.   bisacodyl 10 MG suppository Commonly known as:  DULCOLAX Place 10 mg rectally daily as needed for mild constipation or moderate constipation. *If Milk of Magnesia does not resolve constipation.*   budesonide 0.25 MG/2ML nebulizer solution Commonly known as:  PULMICORT Take 2 mLs (0.25 mg total) by nebulization 2 (two) times daily.   calcium-vitamin D 500-200 MG-UNIT tablet Commonly known as:  OSCAL WITH D Take 1 tablet by mouth 2 (two) times daily.   camphor-menthol lotion Commonly known as:  SARNA Apply 1 application  topically as needed for itching.   cetirizine 5 MG tablet Commonly known as:  ZYRTEC Take 5 mg by mouth at bedtime as needed for allergies.   cholecalciferol 1000 units tablet Commonly known as:  VITAMIN D Take 1,000 Units by mouth daily.   docusate sodium 100 MG capsule Commonly known as:  COLACE Take 100 mg by mouth 2 (two) times daily as needed for mild constipation or moderate constipation.   docusate sodium 100 MG capsule Commonly known as:  COLACE TAKE ONE CAPSULE BY MOUTH TWICE A DAY   Fluticasone-Salmeterol 250-50 MCG/DOSE Aepb Commonly known as:  ADVAIR DISKUS Inhale 1 puff into the lungs 2 (two) times daily.   folic acid 073 MCG tablet Commonly known as:  FOLVITE Take 400 mcg by mouth daily.   glimepiride 2 MG tablet Commonly known as:  AMARYL Take 3 mg by mouth 2 (two) times daily.   glimepiride 2 MG tablet Commonly known as:  AMARYL Take 2 mg by mouth daily.   guaifenesin 100 MG/5ML syrup Commonly known as:  ROBITUSSIN Take 200 mg by mouth every 4 (four) hours as needed for cough. *notify MD if symptoms continue more than 48 hours hours.   HYDROcodone-acetaminophen 5-325 MG tablet Commonly known as:  NORCO/VICODIN Take 1 tablet by mouth every 4 (four) hours as needed for moderate pain or severe pain.   Insulin Pen Needle 32G X 4 MM Misc Commonly known as:  CLICKFINE PEN NEEDLES Novolog FlexPen Needles-use twice a day   insulin regular 100 units/mL injection Commonly known as:  NOVOLIN R,HUMULIN R Inject into the skin 3 (three) times daily before meals. Sliding scale.   loperamide 2 MG tablet Commonly known as:  IMODIUM A-D Take 2-4 mg by mouth See admin instructions. Take 2 tablets ('4mg'$ ) by mouth for initial loose stool, then take 1 tablet by mouth after each subsequent loose stool as needed up to 8 doses in 24 hours.   losartan 50 MG tablet Commonly known as:  COZAAR Take 50 mg by mouth daily.   losartan 100 MG tablet Commonly known as:   COZAAR Take 100 mg by mouth daily.   lovastatin 20 MG tablet Commonly known as:  MEVACOR Take 20 mg by mouth at bedtime.   magnesium hydroxide 400 MG/5ML suspension Commonly known as:  MILK OF MAGNESIA Take 30 mLs by mouth daily as needed for mild constipation. *no results in 24 hours may administer bisacodyl suppository*   metFORMIN 500 MG tablet Commonly known as:  GLUCOPHAGE Take 500 mg by mouth 2 (two) times daily with a meal.   metoprolol succinate 25 MG 24 hr tablet Commonly known as:  TOPROL-XL Take 25 mg by mouth daily.   metoprolol succinate 50 MG 24 hr tablet Commonly known as:  TOPROL-XL Take 1 tablet (50 mg total) by mouth daily. Take with or immediately following a meal.   montelukast 10 MG tablet Commonly known as:  SINGULAIR Take 10 mg by mouth at bedtime.   nystatin cream Commonly known as:  MYCOSTATIN Apply topically 2 (two) times daily.   nystatin powder Commonly known as:  MYCOSTATIN/NYSTOP Apply topically 2 (two) times daily.   omeprazole 20 MG capsule Commonly known as:  PRILOSEC Take 20 mg by mouth daily.   rOPINIRole 0.25 MG tablet Commonly known as:  REQUIP Take 1 tablet (0.25 mg total) by mouth at bedtime.   senna 8.6 MG Tabs tablet Commonly known as:  SENOKOT Take 8.6 mg by mouth 2 (two) times daily.   tiotropium 18 MCG inhalation capsule Commonly known as:  SPIRIVA Place 18 mcg into inhaler and inhale daily.   tiotropium 18 MCG inhalation capsule Commonly known as:  SPIRIVA Place 1 capsule (18 mcg total) into inhaler and inhale daily.   traMADol 50 MG tablet Commonly known as:  ULTRAM Take 50 mg by mouth every 6 (six) hours as needed for moderate pain or severe pain.   triamcinolone cream 0.1 % Commonly known as:  KENALOG APPLY TOPICALLY TWICE A DAY   trolamine salicylate 10 % cream Commonly known as:  ASPERCREME Apply 1 application topically 4 (four) times daily as needed for muscle pain. Apply and massage on the right  hip/outer thigh.   vitamin B-12 250 MCG tablet Commonly known as:  CYANOCOBALAMIN Take 250 mcg by mouth daily.       Review of Systems  Unable to perform ROS: Dementia  Constitutional: Negative.   HENT: Negative for congestion, postnasal drip, rhinorrhea, sinus pressure, trouble swallowing and voice change.   Eyes: Negative.   Respiratory: Negative for cough, choking, chest tightness, shortness of breath and wheezing.   Cardiovascular: Negative for chest pain and leg swelling.  Gastrointestinal: Negative.   Genitourinary: Positive for frequency. Negative for dysuria, enuresis and urgency.  Musculoskeletal: Negative.   Skin: Positive for wound.  Neurological: Negative.   Psychiatric/Behavioral: Positive for confusion and decreased concentration.  All other systems reviewed and are negative.   Immunization History  Administered Date(s) Administered  . Influenza Split 12/05/2011   Pertinent  Health Maintenance Due  Topic Date Due  . FOOT EXAM  12/05/1943  . OPHTHALMOLOGY EXAM  12/05/1943  . DEXA SCAN  12/05/2000  . PNA vac Low Risk Adult (1 of 2 - PCV13) 12/05/2000  . INFLUENZA VACCINE  08/22/2016  . HEMOGLOBIN A1C  11/20/2016   No flowsheet data found. Functional Status Survey:    Vitals:   06/06/16 1020  BP: (!) 141/58  Pulse: 64  Resp: 16  Temp: 97.9 F (36.6 C)  SpO2: 98%   There is no height or weight on file to calculate BMI. Physical Exam  Constitutional: She is oriented to person, place, and time. She appears well-developed and well-nourished. No distress.  HENT:  Head: Normocephalic and atraumatic.  Mouth/Throat: Oropharynx is clear and moist.  Eyes: Conjunctivae and EOM are normal. Pupils are equal, round, and reactive to light.  Neck: Normal range of motion. Neck supple. No JVD present.  Cardiovascular: Normal rate, regular rhythm, normal heart sounds and intact distal pulses.  Exam reveals no gallop and no friction rub.   No murmur  heard. Pulmonary/Chest: Effort normal and breath sounds normal. No respiratory distress. She has no wheezes. She has no rales. She exhibits no tenderness.  Abdominal: Soft. Bowel sounds are  normal. She exhibits no distension. There is no tenderness. There is no rebound and no guarding.  Lymphadenopathy:    She has no cervical adenopathy.  Neurological: She is alert and oriented to person, place, and time.  Skin: Skin is warm and dry. Laceration (skin tear) noted. No rash noted. She is not diaphoretic. There is erythema. No pallor.     Psychiatric: She has a normal mood and affect. Her behavior is normal. Judgment and thought content normal. Her speech is delayed. Cognition and memory are impaired. She exhibits abnormal recent memory and abnormal remote memory.  Nursing note and vitals reviewed.   Labs reviewed:  Recent Labs  10/21/15 1850 05/21/16 1300  NA 141 142  K 4.0 3.7  CL 107 105  CO2 27 25  GLUCOSE 101* 158*  BUN 22* 23*  CREATININE 0.93 0.96  CALCIUM 9.4 9.1  MG 1.5* 1.7    Recent Labs  10/21/15 1850 05/21/16 1300  AST 22 25  ALT 18 18  ALKPHOS 44 47  BILITOT 0.6 0.5  PROT 6.9 6.7  ALBUMIN 4.0 3.9    Recent Labs  10/21/15 1850 05/21/16 1300  WBC 10.4 8.9  NEUTROABS 5.3 5.7  HGB 14.3 14.3  HCT 42.9 44.2  MCV 90.6 91.2  PLT 294 292   Lab Results  Component Value Date   TSH 1.359 05/21/2016   Lab Results  Component Value Date   HGBA1C 6.8 (H) 05/21/2016   Lab Results  Component Value Date   CHOL 170 10/21/2015   HDL 40 (L) 10/21/2015   LDLCALC 92 10/21/2015   TRIG 188 (H) 10/21/2015   CHOLHDL 4.3 10/21/2015    Significant Diagnostic Results in last 30 days:  No results found.  Assessment/Plan 1. Confusion  UA, C&S  2. Skin tear of right elbow without complication, initial encounter  Clean area with NS, Apply Bactroban 2% ointment BID, cover with Allevyn.  Change Allevyn dressing Q 3 days  Family/ staff Communication:   Total  Time:  Documentation:  Face to Face:  Family/Phone:   Labs/tests ordered:  UA, C&S  Medication list reviewed and assessed for continued appropriateness.  Vikki Ports, NP-C Geriatrics [facility] [phone] [address], [place] [address] Cell Phone (Mon-Fri 8am-5pm):  [phone] On Call:  [phone] & follow prompts after 5pm & weekends Office Phone:  [phone] Office Fax:  [phone]

## 2016-06-13 ENCOUNTER — Other Ambulatory Visit
Admission: RE | Admit: 2016-06-13 | Discharge: 2016-06-13 | Disposition: A | Discharge status: Home / Self Care | Payer: Medicare Other | Source: Ambulatory Visit | Attending: Gerontology | Admitting: Gerontology

## 2016-06-13 DIAGNOSIS — M545 Low back pain: Secondary | ICD-10-CM | POA: Diagnosis not present

## 2016-06-13 DIAGNOSIS — R410 Disorientation, unspecified: Secondary | ICD-10-CM | POA: Insufficient documentation

## 2016-06-13 DIAGNOSIS — W19XXXA Unspecified fall, initial encounter: Secondary | ICD-10-CM | POA: Diagnosis not present

## 2016-06-13 LAB — URINALYSIS, COMPLETE (UACMP) WITH MICROSCOPIC
Bilirubin Urine: NEGATIVE
Glucose, UA: 150 mg/dL — AB
Hgb urine dipstick: NEGATIVE
Ketones, ur: NEGATIVE mg/dL
Leukocytes, UA: NEGATIVE
NITRITE: NEGATIVE
PROTEIN: NEGATIVE mg/dL
SPECIFIC GRAVITY, URINE: 1.013 (ref 1.005–1.030)
pH: 5 (ref 5.0–8.0)

## 2016-06-14 DIAGNOSIS — R410 Disorientation, unspecified: Secondary | ICD-10-CM | POA: Diagnosis not present

## 2016-06-14 LAB — GLUCOSE, CAPILLARY: GLUCOSE-CAPILLARY: 130 mg/dL — AB (ref 65–99)

## 2016-06-15 DIAGNOSIS — R410 Disorientation, unspecified: Secondary | ICD-10-CM | POA: Diagnosis not present

## 2016-06-15 LAB — URINE CULTURE

## 2016-06-15 LAB — GLUCOSE, CAPILLARY: Glucose-Capillary: 110 mg/dL — ABNORMAL HIGH (ref 65–99)

## 2016-06-16 ENCOUNTER — Other Ambulatory Visit
Admission: RE | Admit: 2016-06-16 | Discharge: 2016-06-16 | Disposition: A | Discharge status: Home / Self Care | Payer: Medicare Other | Source: Ambulatory Visit | Attending: Internal Medicine | Admitting: Internal Medicine

## 2016-06-16 DIAGNOSIS — R8299 Other abnormal findings in urine: Secondary | ICD-10-CM | POA: Diagnosis not present

## 2016-06-16 DIAGNOSIS — R410 Disorientation, unspecified: Secondary | ICD-10-CM | POA: Diagnosis not present

## 2016-06-16 LAB — URINALYSIS, COMPLETE (UACMP) WITH MICROSCOPIC
BILIRUBIN URINE: NEGATIVE
Bacteria, UA: NONE SEEN
Glucose, UA: 150 mg/dL — AB
Hgb urine dipstick: NEGATIVE
Ketones, ur: NEGATIVE mg/dL
Leukocytes, UA: NEGATIVE
Nitrite: NEGATIVE
PH: 5 (ref 5.0–8.0)
Protein, ur: NEGATIVE mg/dL
SPECIFIC GRAVITY, URINE: 1.017 (ref 1.005–1.030)

## 2016-06-17 LAB — URINE CULTURE: CULTURE: NO GROWTH

## 2016-06-19 DIAGNOSIS — R410 Disorientation, unspecified: Secondary | ICD-10-CM | POA: Diagnosis not present

## 2016-06-19 LAB — GLUCOSE, CAPILLARY: Glucose-Capillary: 154 mg/dL — ABNORMAL HIGH (ref 65–99)

## 2016-06-20 DIAGNOSIS — R410 Disorientation, unspecified: Secondary | ICD-10-CM | POA: Diagnosis not present

## 2016-06-20 LAB — GLUCOSE, CAPILLARY: Glucose-Capillary: 101 mg/dL — ABNORMAL HIGH (ref 65–99)

## 2016-06-22 ENCOUNTER — Encounter
Admission: RE | Admit: 2016-06-22 | Discharge: 2016-06-22 | Disposition: A | Discharge status: Home / Self Care | Payer: Medicare Other | Source: Ambulatory Visit | Attending: Internal Medicine | Admitting: Internal Medicine

## 2016-07-10 ENCOUNTER — Non-Acute Institutional Stay (SKILLED_NURSING_FACILITY): Payer: Medicare Other | Admitting: Gerontology

## 2016-07-10 DIAGNOSIS — M1611 Unilateral primary osteoarthritis, right hip: Secondary | ICD-10-CM | POA: Diagnosis not present

## 2016-07-10 DIAGNOSIS — R141 Gas pain: Secondary | ICD-10-CM

## 2016-07-11 DIAGNOSIS — M79651 Pain in right thigh: Secondary | ICD-10-CM | POA: Diagnosis not present

## 2016-07-11 DIAGNOSIS — M810 Age-related osteoporosis without current pathological fracture: Secondary | ICD-10-CM | POA: Diagnosis not present

## 2016-07-11 DIAGNOSIS — J410 Simple chronic bronchitis: Secondary | ICD-10-CM | POA: Diagnosis not present

## 2016-07-11 DIAGNOSIS — G301 Alzheimer's disease with late onset: Secondary | ICD-10-CM | POA: Diagnosis not present

## 2016-07-11 DIAGNOSIS — E1122 Type 2 diabetes mellitus with diabetic chronic kidney disease: Secondary | ICD-10-CM | POA: Diagnosis not present

## 2016-07-11 DIAGNOSIS — R109 Unspecified abdominal pain: Secondary | ICD-10-CM | POA: Diagnosis not present

## 2016-07-13 ENCOUNTER — Other Ambulatory Visit
Admission: RE | Admit: 2016-07-13 | Discharge: 2016-07-13 | Disposition: A | Discharge status: Home / Self Care | Payer: Medicare Other | Source: Skilled Nursing Facility | Attending: Internal Medicine | Admitting: Internal Medicine

## 2016-07-13 DIAGNOSIS — R410 Disorientation, unspecified: Secondary | ICD-10-CM | POA: Diagnosis not present

## 2016-07-13 DIAGNOSIS — Z9181 History of falling: Secondary | ICD-10-CM | POA: Diagnosis not present

## 2016-07-13 LAB — CBC WITH DIFFERENTIAL/PLATELET
BASOS ABS: 0.1 10*3/uL (ref 0–0.1)
Basophils Relative: 1 %
EOS ABS: 0.4 10*3/uL (ref 0–0.7)
Eosinophils Relative: 4 %
HCT: 40.4 % (ref 35.0–47.0)
HEMOGLOBIN: 13.6 g/dL (ref 12.0–16.0)
LYMPHS ABS: 3 10*3/uL (ref 1.0–3.6)
LYMPHS PCT: 33 %
MCH: 29.9 pg (ref 26.0–34.0)
MCHC: 33.6 g/dL (ref 32.0–36.0)
MCV: 89.1 fL (ref 80.0–100.0)
Monocytes Absolute: 0.9 10*3/uL (ref 0.2–0.9)
Monocytes Relative: 10 %
NEUTROS PCT: 52 %
Neutro Abs: 4.7 10*3/uL (ref 1.4–6.5)
Platelets: 308 10*3/uL (ref 150–440)
RBC: 4.53 MIL/uL (ref 3.80–5.20)
RDW: 13.2 % (ref 11.5–14.5)
WBC: 9.2 10*3/uL (ref 3.6–11.0)

## 2016-07-13 LAB — COMPREHENSIVE METABOLIC PANEL
ALK PHOS: 46 U/L (ref 38–126)
ALT: 14 U/L (ref 14–54)
AST: 15 U/L (ref 15–41)
Albumin: 3.6 g/dL (ref 3.5–5.0)
Anion gap: 8 (ref 5–15)
BUN: 25 mg/dL — AB (ref 6–20)
CALCIUM: 9 mg/dL (ref 8.9–10.3)
CO2: 26 mmol/L (ref 22–32)
CREATININE: 0.8 mg/dL (ref 0.44–1.00)
Chloride: 106 mmol/L (ref 101–111)
GFR calc Af Amer: >60 mL/min (ref >60)
GFR calc non Af Amer: >60 mL/min (ref >60)
GLUCOSE: 128 mg/dL — AB (ref 65–99)
Potassium: 3.9 mmol/L (ref 3.5–5.1)
SODIUM: 140 mmol/L (ref 135–145)
Total Bilirubin: 0.6 mg/dL (ref 0.3–1.2)
Total Protein: 6.4 g/dL — ABNORMAL LOW (ref 6.5–8.1)

## 2016-07-22 ENCOUNTER — Encounter
Admission: RE | Admit: 2016-07-22 | Discharge: 2016-07-22 | Disposition: A | Discharge status: Home / Self Care | Payer: Medicare Other | Source: Ambulatory Visit | Attending: Internal Medicine | Admitting: Internal Medicine

## 2016-07-24 ENCOUNTER — Non-Acute Institutional Stay (SKILLED_NURSING_FACILITY): Payer: Medicare Other | Admitting: Gerontology

## 2016-07-24 DIAGNOSIS — F0280 Dementia in other diseases classified elsewhere without behavioral disturbance: Secondary | ICD-10-CM

## 2016-07-24 DIAGNOSIS — Z9181 History of falling: Secondary | ICD-10-CM

## 2016-07-24 DIAGNOSIS — R634 Abnormal weight loss: Secondary | ICD-10-CM | POA: Diagnosis not present

## 2016-07-24 DIAGNOSIS — G301 Alzheimer's disease with late onset: Secondary | ICD-10-CM | POA: Diagnosis not present

## 2016-07-26 ENCOUNTER — Encounter: Payer: Self-pay | Admitting: Gerontology

## 2016-07-26 NOTE — Progress Notes (Signed)
Location:   [place] [facility] Room Number: [place] of Service:  SNF [phone]) Provider:  Toni Arthurs, NP-C  Kirk Ruths, MD  Patient Care Team: Kirk Ruths, MD as PCP - General (Internal Medicine) Einar Pheasant, MD (Internal Medicine)  Extended Emergency Contact Information Primary Emergency Contact: Duwayne Heck Address: [address]          [place], [place] [address] [name] of [place] Phone: [phone] Relation: None Secondary Emergency Contact: [name] Address: [address]          [place], [place] [address] Home Phone: [phone] Relation: None  Code Status:  DNR Goals of care: Advanced Directive information Advanced Directives 07/24/2016  Does Patient Have a Medical Advance Directive? Yes  Type of Advance Directive Out of facility DNR (pink MOST or yellow form)  Does patient want to make changes to medical advance directive? No - Patient declined  Copy of [place] in Chart? -     Chief Complaint  Patient presents with  . Acute Visit    Acute     HPI:  Pt is a 83 y.o. female seen today for an acute visit for abnormal weight loss and falls. Pt has had a significant decline over the past few months. She has had a reduced PO intake. She seems to forget to eat. Family is also concerned that she has had multiple falls in the past few weeks. She seems to have the most falls in the middle of the night when she attempts to get up and go to the bathroom unassisted. Pt typically sits in her recliner most of the day and typically does not attempt to get up alone. 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 Pt's vocabulary has diminished. Pt has lost weight. Labs stable. VSS. No other complaints.     Past Medical History:  Diagnosis Date  . Anemia   . Atrial fibrillation ([place])    post op, converted to  NSR wtih amiodarone  . Bladder cancer ([facility]) 2003   s/p resection and chemotherapy, followed by Dr Jacqlyn Larsen  . COPD (chronic obstructive pulmonary disease) ([place])   . Dementia   . Diabetes mellitus ([facility])   . Diabetes mellitus without complication ([place] [facility])   . GERD (gastroesophageal reflux disease)   . Hypercholesterolemia   . Hyperlipemia   . Hypertension   . Hypothyroidism   . Inner ear disease   . Lung cancer ([place])    squamous cell s/p left upper lobe llingulectomy 12/08/04 - Dr Arlyce Dice   Past Surgical History:  Procedure Laterality Date  . BLADDER SURGERY  2003   for bladder cancer - Dr Jacqlyn Larsen  . CHOLECYSTECTOMY  1986  . LUNG CANCER SURGERY  12/08/04   s/p left upper lobe lingulectomy - Dr Arlyce Dice    Allergies  Allergen Reactions  . Lovenox [Enoxaparin Sodium]   . Novolog [Insulin Aspart]     Allergies as of 07/24/2016      Reactions   Lovenox [enoxaparin Sodium]    No Known Drug Allergy    Novolog [insulin Aspart]       Medication List       Accurate as of 07/24/16 11:59 PM. Always use your most recent med list.          acetaminophen 650 MG CR tablet Commonly known as:  TYLENOL Take 650 mg  by mouth 3 (three) times daily. Do not exceed 3000 mg in 24 hr   bisacodyl 10 MG suppository Commonly known as:  DULCOLAX Place 10 mg rectally daily as needed for mild constipation or moderate constipation. *If Milk of Magnesia does not resolve constipation.*   calcium-vitamin D 500-200 MG-UNIT tablet Commonly known as:  OSCAL WITH D Take 1 tablet by mouth 2 (two) times daily.   cholecalciferol 1000 units tablet Commonly known as:  VITAMIN D Take 1,000 Units by mouth daily.   fluticasone-salmeterol 115-21 MCG/ACT inhaler Commonly known as:  ADVAIR HFA Inhale 2 puffs into the lungs 2 (two) times daily.   folic acid 403 MCG tablet Commonly known as:  FOLVITE Take 400 mcg by mouth daily.   gabapentin 100 MG capsule Commonly known as:  NEURONTIN Take 100 mg by mouth daily. 9  am   gabapentin 300 MG capsule Commonly known as:  NEURONTIN Take 300 mg by mouth at bedtime.   glimepiride 2 MG tablet Commonly known as:  AMARYL Take 3 mg by mouth 2 (two) times daily. 1 1/2 tab = 3 mg   HYDROcodone-acetaminophen 5-325 MG tablet Commonly known as:  NORCO/VICODIN Take 1 tablet by mouth every 4 (four) hours as needed for moderate pain or severe pain.   Lidocaine 4 % Ptch Apply 1 patch topically daily. Apply at 9 am to area on lower back daily. Remove after 12 hours   losartan 50 MG tablet Commonly known as:  COZAAR Take 50 mg by mouth daily.   magnesium hydroxide 400 MG/5ML suspension Commonly known as:  MILK OF MAGNESIA Take 30 mLs by mouth daily as needed for mild constipation. *no results in 24 hours may administer bisacodyl suppository*   Magnesium Oxide 250 MG Tabs Take 1 tablet by mouth daily.   metFORMIN 500 MG tablet Commonly known as:  GLUCOPHAGE Take 500 mg by mouth 2 (two) times daily with a meal.   metoprolol succinate 25 MG 24 hr tablet Commonly known as:  TOPROL-XL Take 25 mg by mouth daily.   montelukast 10 MG tablet Commonly known as:  SINGULAIR Take 10 mg by mouth at bedtime.   nystatin powder Commonly known as:  MYCOSTATIN/NYSTOP Apply thin film of powder topically underneath each breast every shift until healed for redness/itching.   omeprazole 20 MG capsule Commonly known as:  PRILOSEC Take 20 mg by mouth daily.   polyethylene glycol packet Commonly known as:  MIRALAX / GLYCOLAX Take 17 g by mouth daily.   rOPINIRole 0.25 MG tablet Commonly known as:  REQUIP Take 1 tablet (0.25 mg total) by mouth at bedtime.   sennosides-docusate sodium 8.6-50 MG tablet Commonly known as:  SENOKOT-S Take 1 tablet by mouth 2 (two) times daily.   tiotropium 18 MCG inhalation capsule Commonly known as:  SPIRIVA Place 18 mcg into inhaler and inhale daily.   torsemide 10 MG tablet Commonly known as:  DEMADEX Take 5 mg by mouth daily.  1/2 tablet   trolamine salicylate 10 % cream Commonly known as:  ASPERCREME Apply 1 application topically 3 (three) times daily as needed for muscle pain. Apply and massage on the right hip/outer thigh.   vitamin B-12 250 MCG tablet Commonly known as:  CYANOCOBALAMIN Take 250 mcg by mouth daily.       Review of Systems  Unable to perform ROS: Dementia  Constitutional: Negative.   HENT: Negative for congestion, postnasal drip, rhinorrhea, sinus pressure, trouble swallowing and voice change.   Eyes: Negative.  Respiratory: Negative for cough, choking, chest tightness, shortness of breath and wheezing.   Cardiovascular: Negative for chest pain and leg swelling.  Gastrointestinal: Negative.   Genitourinary: Positive for frequency. Negative for dysuria, enuresis and urgency.  Musculoskeletal: Negative.   Skin: Positive for wound.  Neurological: Negative.   Psychiatric/Behavioral: Positive for confusion and decreased concentration.  All other systems reviewed and are negative.   Immunization History  Administered Date(s) Administered  . Influenza Split 12/05/2011  . Influenza-Unspecified 10/21/2013, 10/08/2014, 10/08/2015  . PPD Test 11/05/2015   Pertinent  Health Maintenance Due  Topic Date Due  . FOOT EXAM  12/05/1943  . OPHTHALMOLOGY EXAM  12/05/1943  . DEXA SCAN  12/05/2000  . PNA vac Low Risk Adult (1 of 2 - PCV13) 12/05/2000  . INFLUENZA VACCINE  08/22/2016  . HEMOGLOBIN A1C  11/20/2016   No flowsheet data found. Functional Status Survey:    Vitals:   07/24/16 1542  BP: (!) 115/53  Pulse: 65  Resp: 20  Temp: 97.9 F (36.6 C)  SpO2: 96%  Weight: 126 lb 3.2 oz (57.2 kg)  Height: 5\' 5"  (1.651 m)   Body mass index is 21 kg/m. Physical Exam  Constitutional: She is oriented to person, place, and time. She appears well-developed and well-nourished. No distress.  HENT:  Head: Normocephalic and atraumatic.  Mouth/Throat: Oropharynx is clear and moist.  Eyes:  Pupils are equal, round, and reactive to light. Conjunctivae and EOM are normal.  Neck: Normal range of motion. Neck supple. No JVD present.  Cardiovascular: Normal rate, regular rhythm, normal heart sounds and intact distal pulses.  Exam reveals no gallop and no friction rub.   No murmur heard. Pulmonary/Chest: Effort normal and breath sounds normal. No respiratory distress. She has no wheezes. She has no rales. She exhibits no tenderness.  Abdominal: Soft. Bowel sounds are normal. She exhibits no distension. There is no tenderness. There is no rebound and no guarding.  Lymphadenopathy:    She has no cervical adenopathy.  Neurological: She is alert and oriented to person, place, and time.  Skin: Skin is warm and dry. No laceration and no rash noted. She is not diaphoretic. No erythema. No pallor.  Psychiatric: She has a normal mood and affect. Her behavior is normal. Judgment and thought content normal. Her speech is delayed. Cognition and memory are impaired. She exhibits abnormal recent memory and abnormal remote memory.  Nursing note and vitals reviewed.   Labs reviewed:  Recent Labs  10/21/15 1850 05/21/16 1300 07/13/16 0710  NA 141 142 140  K 4.0 3.7 3.9  CL 107 105 106  CO2 27 25 26   GLUCOSE 101* 158* 128*  BUN 22* 23* 25*  CREATININE 0.93 0.96 0.80  CALCIUM 9.4 9.1 9.0  MG 1.5* 1.7  --     Recent Labs  10/21/15 1850 05/21/16 1300 07/13/16 0710  AST 22 25 15   ALT 18 18 14   ALKPHOS 44 47 46  BILITOT 0.6 0.5 0.6  PROT 6.9 6.7 6.4*  ALBUMIN 4.0 3.9 3.6    Recent Labs  10/21/15 1850 05/21/16 1300 07/13/16 0710  WBC 10.4 8.9 9.2  NEUTROABS 5.3 5.7 4.7  HGB 14.3 14.3 13.6  HCT 42.9 44.2 40.4  MCV 90.6 91.2 89.1  PLT 294 292 308   Lab Results  Component Value Date   TSH 1.359 05/21/2016   Lab Results  Component Value Date   HGBA1C 6.8 (H) 05/21/2016   Lab Results  Component Value Date  CHOL 170 10/21/2015   HDL 40 (L) 10/21/2015   LDLCALC 92  10/21/2015   TRIG 188 (H) 10/21/2015   CHOLHDL 4.3 10/21/2015    Significant Diagnostic Results in last 30 days:  No results found.  Assessment/Plan 1. Late Onset Alzheimer's disease without behavioral disturbance 2. Hx of falling  Have staff schedule bathroom assistance at 2 am   Monitor closely  Fall precautions  3. Abnormal Weight Loss  Mirtazepine 7.5 mg po Q HS for appetite stimulant and sleep  Magic Cup BID with lunch and supper  Update Palliative Care of changes  Family/ staff Communication:   Total Time:  Documentation:  Face to Face:  Family/Phone:   Labs/tests ordered:    Medication list reviewed and assessed for continued appropriateness.  Vikki Ports, NP-C Geriatrics [facility] [phone], [place] [address] Cell Phone (Mon-Fri 8am-5pm):  [phone] On Call:  [phone] & follow prompts after 5pm & weekends Office Phone:  [phone] Office Fax:  [phone]

## 2016-07-29 NOTE — Progress Notes (Signed)
Location:      Place of Service:  SNF (31) Provider:  Toni Arthurs, NP-C  Kirk Ruths, MD  Patient Care Team: Kirk Ruths, MD as PCP - General (Internal Medicine) Einar Pheasant, MD (Internal Medicine)  Extended Emergency Contact Information Primary Emergency Contact: Duwayne Heck Address: [address]          [place], [place] [address] [name] of [place] Phone: [phone] Relation: None Secondary Emergency Contact: [name] Address: [address]          [place], [place] [address] Home Phone: [phone] Relation: None  Code Status:  DNR Goals of care: Advanced Directive information Advanced Directives 07/24/2016  Does Patient Have a Medical Advance Directive? Yes  Type of Advance Directive Out of facility DNR (pink MOST or yellow form)  Does patient want to make changes to medical advance directive? No - Patient declined  Copy of [place] in Chart? -     Chief Complaint  Patient presents with  . Acute Visit    HPI:  Pt is a 83 y.o. female seen today for an acute visit for pain. Pt is minimally verbal at this point, making it difficult to obtain a ROS. Pt will grimace intermittently and rub her abdomen. She does have a h/o intermittent constipation. She also will grimace if her right hip is palpated. However, it is non-specific. She has a h/o multiple fractures of the right hip/femur with subsequent arthritis. Pt's cognition has diminished further over the past few months. Staff deny pt having n/v/d/f/c/cp/SOB. She has been getting up unassisted and fallen several times. No injuries aside from skin tear on the left arm. VSS. No other complaints.    Past Medical History:  Diagnosis Date  . Anemia   . Atrial fibrillation ([place])    post op, converted to NSR wtih amiodarone  . Bladder cancer ([place]) 2003   s/p resection and chemotherapy, followed by Dr Jacqlyn Larsen  . COPD (chronic obstructive pulmonary disease) ([place])   .  Dementia   . Diabetes mellitus ([place])   . Diabetes mellitus without complication ([place])   . GERD (gastroesophageal reflux disease)   . Hypercholesterolemia   . Hyperlipemia   . Hypertension   . Hypothyroidism   . Inner ear disease   . Lung cancer ([place])    squamous cell s/p left upper lobe llingulectomy 12/08/04 - Dr Arlyce Dice   Past Surgical History:  Procedure Laterality Date  . BLADDER SURGERY  2003   for bladder cancer - Dr Jacqlyn Larsen  . CHOLECYSTECTOMY  1986  . LUNG CANCER SURGERY  12/08/04   s/p left upper lobe lingulectomy - Dr Arlyce Dice    Allergies  Allergen Reactions  . Lovenox [Enoxaparin Sodium]   . Novolog [Insulin Aspart]     Allergies as of 07/10/2016      Reactions   Lovenox [enoxaparin Sodium]    No Known Drug Allergy    Novolog [insulin Aspart]       Medication List       Accurate as of 07/10/16 11:59 PM. Always use your most recent med list.          acetaminophen 650 MG CR tablet Commonly known as:  TYLENOL Take 650 mg by mouth 3 (three) times daily. Do not exceed 3000 mg in 24 hr   bisacodyl 10 MG suppository Commonly known as:  DULCOLAX Place 10 mg rectally daily as needed for mild constipation or moderate constipation. *If Milk of Magnesia does not  resolve constipation.*   calcium-vitamin D 500-200 MG-UNIT tablet Commonly known as:  OSCAL WITH D Take 1 tablet by mouth 2 (two) times daily.   cholecalciferol 1000 units tablet Commonly known as:  VITAMIN D Take 1,000 Units by mouth daily.   folic acid 401 MCG tablet Commonly known as:  FOLVITE Take 400 mcg by mouth daily.   glimepiride 2 MG tablet Commonly known as:  AMARYL Take 3 mg by mouth 2 (two) times daily. 1 1/2 tab = 3 mg   HYDROcodone-acetaminophen 5-325 MG tablet Commonly known as:  NORCO/VICODIN Take 1 tablet by mouth every 4 (four) hours as needed for moderate pain or severe pain.   losartan 50 MG tablet Commonly known as:  COZAAR Take 50 mg by mouth daily.   magnesium hydroxide  400 MG/5ML suspension Commonly known as:  MILK OF MAGNESIA Take 30 mLs by mouth daily as needed for mild constipation. *no results in 24 hours may administer bisacodyl suppository*   metFORMIN 500 MG tablet Commonly known as:  GLUCOPHAGE Take 500 mg by mouth 2 (two) times daily with a meal.   metoprolol succinate 25 MG 24 hr tablet Commonly known as:  TOPROL-XL Take 25 mg by mouth daily.   montelukast 10 MG tablet Commonly known as:  SINGULAIR Take 10 mg by mouth at bedtime.   omeprazole 20 MG capsule Commonly known as:  PRILOSEC Take 20 mg by mouth daily.   rOPINIRole 0.25 MG tablet Commonly known as:  REQUIP Take 1 tablet (0.25 mg total) by mouth at bedtime.   tiotropium 18 MCG inhalation capsule Commonly known as:  SPIRIVA Place 18 mcg into inhaler and inhale daily.   trolamine salicylate 10 % cream Commonly known as:  ASPERCREME Apply 1 application topically 3 (three) times daily as needed for muscle pain. Apply and massage on the right hip/outer thigh.   vitamin B-12 250 MCG tablet Commonly known as:  CYANOCOBALAMIN Take 250 mcg by mouth daily.       Review of Systems  Unable to perform ROS: Dementia  Constitutional: Negative.   HENT: Negative for congestion, postnasal drip, rhinorrhea, sinus pressure, trouble swallowing and voice change.   Eyes: Negative.   Respiratory: Negative for cough, choking, chest tightness, shortness of breath and wheezing.   Cardiovascular: Negative for chest pain and leg swelling.  Gastrointestinal: Negative.   Genitourinary: Positive for frequency. Negative for dysuria, enuresis and urgency.  Musculoskeletal: Negative.   Skin: Positive for wound.  Neurological: Negative.   Psychiatric/Behavioral: Positive for confusion and decreased concentration.  All other systems reviewed and are negative.   Immunization History  Administered Date(s) Administered  . Influenza Split 12/05/2011  . Influenza-Unspecified 10/21/2013,  10/08/2014, 10/08/2015  . PPD Test 11/05/2015   Pertinent  Health Maintenance Due  Topic Date Due  . FOOT EXAM  12/05/1943  . OPHTHALMOLOGY EXAM  12/05/1943  . DEXA SCAN  12/05/2000  . PNA vac Low Risk Adult (1 of 2 - PCV13) 12/05/2000  . INFLUENZA VACCINE  08/22/2016  . HEMOGLOBIN A1C  11/20/2016   No flowsheet data found. Functional Status Survey:    Vitals:   06/27/16 1040  BP: 137/83  Pulse: 84  Resp: 20  Temp: 98.4 F (36.9 C)  SpO2: 96%   There is no height or weight on file to calculate BMI. Physical Exam  Constitutional: She is oriented to person, place, and time. Vital signs are normal. She appears well-developed and well-nourished. No distress.  HENT:  Head: Normocephalic and atraumatic.  Mouth/Throat: Oropharynx is clear and moist.  Eyes: Conjunctivae and EOM are normal. Pupils are equal, round, and reactive to light.  Neck: Normal range of motion. Neck supple. No JVD present.  Cardiovascular: Normal rate, regular rhythm, normal heart sounds and intact distal pulses.  Exam reveals no gallop and no friction rub.   No murmur heard. Pulmonary/Chest: Effort normal and breath sounds normal. No respiratory distress. She has no wheezes. She has no rales. She exhibits no tenderness.  Abdominal: Soft. Bowel sounds are normal. She exhibits no distension. There is tenderness in the right upper quadrant and epigastric area. There is no rebound and no guarding.  Musculoskeletal:       Right hip: She exhibits tenderness. She exhibits normal range of motion, no swelling, no deformity and no laceration.  Lymphadenopathy:    She has no cervical adenopathy.  Neurological: She is alert and oriented to person, place, and time.  Skin: Skin is warm and dry. Laceration (skin tear) noted. No rash noted. She is not diaphoretic. No erythema. No pallor.  Psychiatric: She has a normal mood and affect. Her behavior is normal. Judgment and thought content normal. Her speech is delayed.  Cognition and memory are impaired. She exhibits abnormal recent memory and abnormal remote memory.  Nursing note and vitals reviewed.   Labs reviewed:  Recent Labs  10/21/15 1850 05/21/16 1300 07/13/16 0710  NA 141 142 140  K 4.0 3.7 3.9  CL 107 105 106  CO2 27 25 26   GLUCOSE 101* 158* 128*  BUN 22* 23* 25*  CREATININE 0.93 0.96 0.80  CALCIUM 9.4 9.1 9.0  MG 1.5* 1.7  --     Recent Labs  10/21/15 1850 05/21/16 1300 07/13/16 0710  AST 22 25 15   ALT 18 18 14   ALKPHOS 44 47 46  BILITOT 0.6 0.5 0.6  PROT 6.9 6.7 6.4*  ALBUMIN 4.0 3.9 3.6    Recent Labs  10/21/15 1850 05/21/16 1300 07/13/16 0710  WBC 10.4 8.9 9.2  NEUTROABS 5.3 5.7 4.7  HGB 14.3 14.3 13.6  HCT 42.9 44.2 40.4  MCV 90.6 91.2 89.1  PLT 294 292 308   Lab Results  Component Value Date   TSH 1.359 05/21/2016   Lab Results  Component Value Date   HGBA1C 6.8 (H) 05/21/2016   Lab Results  Component Value Date   CHOL 170 10/21/2015   HDL 40 (L) 10/21/2015   LDLCALC 92 10/21/2015   TRIG 188 (H) 10/21/2015   CHOLHDL 4.3 10/21/2015    Significant Diagnostic Results in last 30 days:  No results found.  Assessment/Plan 1. Gas pain  Mylanta 30 ml po Q 6 hours prn  Monitor and ensure regular BMs  2. Osteoarthritis  Continue Tylenol ER 650 mg po TID  Continue Aspercreme TID prn to right hip  Continue Gabapentin BID for pain  Family/ staff Communication:   Total Time:  Documentation:  Face to Face:  Family/Phone:   Labs/tests ordered:  KUB, Right Femur Xray  Medication list reviewed and assessed for continued appropriateness.  Vikki Ports, NP-C Geriatrics [facility] [phone], [place] [address] Cell Phone (Mon-Fri 8am-5pm):  [phone] On Call:  [phone] & follow prompts after 5pm & weekends Office Phone:  [phone] Office Fax:  [phone]

## 2016-08-05 DIAGNOSIS — R634 Abnormal weight loss: Secondary | ICD-10-CM | POA: Insufficient documentation

## 2016-08-13 ENCOUNTER — Non-Acute Institutional Stay (SKILLED_NURSING_FACILITY): Payer: Medicare Other | Admitting: Gerontology

## 2016-08-13 ENCOUNTER — Encounter: Payer: Self-pay | Admitting: Gerontology

## 2016-08-13 ENCOUNTER — Other Ambulatory Visit
Admission: RE | Admit: 2016-08-13 | Discharge: 2016-08-13 | Disposition: A | Discharge status: Home / Self Care | Payer: Medicare Other | Source: Ambulatory Visit | Attending: Internal Medicine | Admitting: Internal Medicine

## 2016-08-13 DIAGNOSIS — R410 Disorientation, unspecified: Secondary | ICD-10-CM | POA: Diagnosis not present

## 2016-08-13 DIAGNOSIS — K219 Gastro-esophageal reflux disease without esophagitis: Secondary | ICD-10-CM | POA: Diagnosis not present

## 2016-08-13 DIAGNOSIS — R627 Adult failure to thrive: Secondary | ICD-10-CM | POA: Diagnosis not present

## 2016-08-13 DIAGNOSIS — E119 Type 2 diabetes mellitus without complications: Secondary | ICD-10-CM | POA: Diagnosis not present

## 2016-08-13 DIAGNOSIS — J449 Chronic obstructive pulmonary disease, unspecified: Secondary | ICD-10-CM | POA: Diagnosis not present

## 2016-08-13 DIAGNOSIS — Z85118 Personal history of other malignant neoplasm of bronchus and lung: Secondary | ICD-10-CM | POA: Diagnosis not present

## 2016-08-13 DIAGNOSIS — E46 Unspecified protein-calorie malnutrition: Secondary | ICD-10-CM | POA: Diagnosis not present

## 2016-08-13 DIAGNOSIS — R531 Weakness: Secondary | ICD-10-CM

## 2016-08-13 DIAGNOSIS — D649 Anemia, unspecified: Secondary | ICD-10-CM | POA: Diagnosis not present

## 2016-08-13 DIAGNOSIS — G309 Alzheimer's disease, unspecified: Secondary | ICD-10-CM | POA: Diagnosis not present

## 2016-08-13 DIAGNOSIS — E039 Hypothyroidism, unspecified: Secondary | ICD-10-CM | POA: Diagnosis not present

## 2016-08-13 DIAGNOSIS — I10 Essential (primary) hypertension: Secondary | ICD-10-CM | POA: Diagnosis not present

## 2016-08-13 DIAGNOSIS — Z8551 Personal history of malignant neoplasm of bladder: Secondary | ICD-10-CM | POA: Diagnosis not present

## 2016-08-13 DIAGNOSIS — I4891 Unspecified atrial fibrillation: Secondary | ICD-10-CM | POA: Diagnosis not present

## 2016-08-13 LAB — URINALYSIS, COMPLETE (UACMP) WITH MICROSCOPIC
Bacteria, UA: NONE SEEN
Bilirubin Urine: NEGATIVE
Glucose, UA: NEGATIVE mg/dL
HGB URINE DIPSTICK: NEGATIVE
Ketones, ur: NEGATIVE mg/dL
LEUKOCYTES UA: NEGATIVE
NITRITE: NEGATIVE
Protein, ur: NEGATIVE mg/dL
SPECIFIC GRAVITY, URINE: 1.017 (ref 1.005–1.030)
pH: 6 (ref 5.0–8.0)

## 2016-08-13 NOTE — Progress Notes (Signed)
Location:   [facility] Room Number: [place] of Service:  SNF [id number]) Provider:  Toni Arthurs, NP-C  Kirk Ruths, MD  Patient Care Team: Kirk Ruths, MD as PCP - General (Internal Medicine) Einar Pheasant, MD (Internal Medicine)  Extended Emergency Contact Information Primary Emergency Contact: Duwayne Heck Address: [address]          [place], [place] [address] [name] of [place] Phone: [phone] Relation: None Secondary Emergency Contact: [name] Address: [address]          [place], [place] [address] Home Phone: [phone] Relation: None  Code Status:  DNR Goals of care: Advanced Directive information Advanced Directives 08/13/2016  Does Patient Have a Medical Advance Directive? Yes  Type of Advance Directive Out of facility DNR (pink MOST or yellow form)  Does patient want to make changes to medical advance directive? No - Patient declined  Copy of [place] in Chart? -     Chief Complaint  Patient presents with  . Follow up    weakness    HPI:  Pt is a 83 y.o. female seen today for increasing weakness. Family is concerned as they have noticed she has started leaning to the right. She is becoming increasingly weak with progressive decreased cognition. She has had multiple falls recently. Pt is essentially non-verbal and unable to give ROS. She is also not able to follow many simple instructions/directions. She continues to smile at familiar faces and when she tries to speak, it is very slow and very simple statements. Sister is here with her and having to cut her food up into very small pieces. She has had weight loss and decreased appetite. Family is concerned her changes are resulting from a UTI as she is prone to infections. They are requesting a UA, C&S.    Past Medical History:  Diagnosis Date  . Anemia   . Atrial fibrillation ([place])    post op, converted to NSR wtih amiodarone    . Bladder cancer ([place]) 2003   s/p resection and chemotherapy, followed by Dr Jacqlyn Larsen  . COPD (chronic obstructive pulmonary disease) ([place])   . Dementia   . Diabetes mellitus ([place])   . Diabetes mellitus without complication ([place])   . GERD (gastroesophageal reflux disease)   . Hypercholesterolemia   . Hyperlipemia   . Hypertension   . Hypothyroidism   . Inner ear disease   . Lung cancer ([place])    squamous cell s/p left upper lobe llingulectomy 12/08/04 - Dr Arlyce Dice   Past Surgical History:  Procedure Laterality Date  . BLADDER SURGERY  2003   for bladder cancer - Dr Jacqlyn Larsen  . CHOLECYSTECTOMY  1986  . LUNG CANCER SURGERY  12/08/04   s/p left upper lobe lingulectomy - Dr Arlyce Dice    Allergies  Allergen Reactions  . Lovenox [Enoxaparin Sodium]   . Novolog [Insulin Aspart]     Allergies as of 08/13/2016      Reactions   Lovenox [enoxaparin Sodium]    Novolog [insulin Aspart]       Medication List       Accurate as of 08/13/16 12:05 PM. Always use your most recent med list.          acetaminophen 650 MG CR tablet Commonly known as:  TYLENOL Take 650 mg by mouth 3 (three) times daily. Do not exceed 3000 mg in 24 hr   bisacodyl 10 MG suppository Commonly known as:  DULCOLAX Place 10 mg rectally daily as needed for mild constipation or moderate constipation. *If Milk of Magnesia does not resolve constipation.*   calcium-vitamin D 500-200 MG-UNIT tablet Commonly known as:  OSCAL WITH D Take 1 tablet by mouth 2 (two) times daily with a meal.   cetirizine 10 MG tablet Commonly known as:  ZYRTEC Take 5 mg by mouth at bedtime. 1/2 tab   cholecalciferol 1000 units tablet Commonly known as:  VITAMIN D Take 1,000 Units by mouth daily.   fluticasone-salmeterol 115-21 MCG/ACT inhaler Commonly known as:  ADVAIR HFA Inhale 2 puffs into the lungs 2 (two) times daily.   folic acid 564 MCG tablet Commonly known as:  FOLVITE Take 400 mcg by mouth daily.   gabapentin 100 MG  capsule Commonly known as:  NEURONTIN Take 100 mg by mouth daily. 9 am   gabapentin 300 MG capsule Commonly known as:  NEURONTIN Take 300 mg by mouth at bedtime.   glimepiride 2 MG tablet Commonly known as:  AMARYL Take 3 mg by mouth 2 (two) times daily. 1 1/2 tab = 3 mg   HYDROcodone-acetaminophen 5-325 MG tablet Commonly known as:  NORCO/VICODIN Take 1 tablet by mouth every 4 (four) hours as needed. Do not exceed 3000 mg of tylenol in 24 hours. Consider all sources.   Lidocaine 4 % Ptch Apply 1 patch topically daily. Apply at 9 am to area on lower back daily. Remove after 12 hours   losartan 50 MG tablet Commonly known as:  COZAAR Take 50 mg by mouth daily.   MAG-AL PLUS XS 332-951-88 MG/5ML suspension Generic drug:  alum & mag hydroxide-simeth Take 15 mLs by mouth 4 (four) times daily. Give with or right after meals and at bedtime   magnesium hydroxide 400 MG/5ML suspension Commonly known as:  MILK OF MAGNESIA Take 30 mLs by mouth every 4 (four) hours as needed for mild constipation. If Constipation/ no BM for 2 days.  *no results in 24 hours may administer bisacodyl suppository*   Magnesium Oxide 250 MG Tabs Take 1 tablet by mouth daily.   Melatonin 3 MG Tabs Take 6 mg by mouth at bedtime. 2 tabs   metFORMIN 500 MG tablet Commonly known as:  GLUCOPHAGE Take 500 mg by mouth 2 (two) times daily with a meal.   metoprolol succinate 25 MG 24 hr tablet Commonly known as:  TOPROL-XL Take 25 mg by mouth daily.   mirtazapine 7.5 MG tablet Commonly known as:  REMERON Take 7.5 mg by mouth at bedtime.   montelukast 10 MG tablet Commonly known as:  SINGULAIR Take 10 mg by mouth at bedtime.   NUTRITIONAL SUPPLEMENTS PO Give Magic cup by mouth 2 times daily with lunch and dinner trays for nutritional support. Pt prefers Vanilla.   nystatin powder Commonly known as:  MYCOSTATIN/NYSTOP Apply thin film of powder topically underneath each breast every shift until healed for  redness/itching.   omeprazole 20 MG capsule Commonly known as:  PRILOSEC Take 20 mg by mouth 2 (two) times daily.   polyethylene glycol packet Commonly known as:  MIRALAX / GLYCOLAX Take 17 g by mouth daily.   rOPINIRole 0.25 MG tablet Commonly known as:  REQUIP Take 1 tablet (0.25 mg total) by mouth at bedtime.   sennosides-docusate sodium 8.6-50 MG tablet Commonly known as:  SENOKOT-S Take 1 tablet by mouth 2 (two) times daily.   tiotropium 18 MCG inhalation capsule Commonly known as:  SPIRIVA Place 18 mcg into inhaler and inhale  daily.   torsemide 10 MG tablet Commonly known as:  DEMADEX Take 5 mg by mouth daily. 1/2 tablet   trolamine salicylate 10 % cream Commonly known as:  ASPERCREME Apply 1 application topically 3 (three) times daily as needed for muscle pain. Apply and massage on the right hip/outer thigh.   vitamin B-12 250 MCG tablet Commonly known as:  CYANOCOBALAMIN Take 250 mcg by mouth daily.       Review of Systems  Unable to perform ROS: Dementia  Constitutional: Positive for activity change and appetite change.  HENT: Negative for congestion, postnasal drip, rhinorrhea, sinus pressure, trouble swallowing and voice change.   Eyes: Negative.   Respiratory: Negative for cough, choking, chest tightness, shortness of breath and wheezing.   Cardiovascular: Negative for chest pain and leg swelling.  Gastrointestinal: Negative.   Genitourinary: Negative.   Musculoskeletal: Negative.   Skin: Positive for color change.  Neurological: Positive for speech difficulty and weakness. Negative for dizziness, tremors, seizures, syncope, facial asymmetry, light-headedness, numbness and headaches.  Psychiatric/Behavioral: Positive for confusion.  All other systems reviewed and are negative.   Immunization History  Administered Date(s) Administered  . Influenza Split 12/05/2011  . Influenza-Unspecified 10/21/2013, 10/08/2014, 10/08/2015  . PPD Test 11/05/2015    Pertinent  Health Maintenance Due  Topic Date Due  . FOOT EXAM  12/05/1943  . OPHTHALMOLOGY EXAM  12/05/1943  . DEXA SCAN  12/05/2000  . PNA vac Low Risk Adult (1 of 2 - PCV13) 12/05/2000  . INFLUENZA VACCINE  08/22/2016  . HEMOGLOBIN A1C  11/20/2016   No flowsheet data found. Functional Status Survey:    Vitals:   08/13/16 1131  BP: 139/75  Pulse: 82  Resp: 18  Temp: 98.5 F (36.9 C)  SpO2: 96%  Weight: 126 lb 3.2 oz (57.2 kg)  Height: 5\' 5"  (1.651 m)   Body mass index is 21 kg/m. Physical Exam  Constitutional: She is oriented to person, place, and time. Vital signs are normal. She appears well-developed and well-nourished. No distress.  HENT:  Head: Normocephalic and atraumatic.  Mouth/Throat: Oropharynx is clear and moist.  Eyes: Pupils are equal, round, and reactive to light. Conjunctivae and EOM are normal.  Neck: Normal range of motion. Neck supple. No JVD present.  Cardiovascular: Normal rate, regular rhythm, normal heart sounds and intact distal pulses.  Exam reveals no gallop and no friction rub.   No murmur heard. Pulses:      Dorsalis pedis pulses are 1+ on the right side, and 1+ on the left side.  Pulmonary/Chest: Effort normal and breath sounds normal. No respiratory distress. She has no decreased breath sounds. She has no wheezes. She has no rhonchi. She has no rales. She exhibits no tenderness.  Abdominal: Soft. Bowel sounds are normal. She exhibits no distension. There is no tenderness. There is no rebound and no guarding.  Lymphadenopathy:    She has no cervical adenopathy.  Neurological: She is alert and oriented to person, place, and time. Coordination and gait abnormal.  Skin: Skin is warm and dry. No rash noted. She is not diaphoretic. No erythema. No pallor.  Psychiatric: She has a normal mood and affect. Her behavior is normal. Judgment and thought content normal.  Nursing note and vitals reviewed.   Labs reviewed:  Recent Labs   10/21/15 1850 05/21/16 1300 07/13/16 0710  NA 141 142 140  K 4.0 3.7 3.9  CL 107 105 106  CO2 27 25 26   GLUCOSE 101* 158* 128*  BUN 22* 23* 25*  CREATININE 0.93 0.96 0.80  CALCIUM 9.4 9.1 9.0  MG 1.5* 1.7  --     Recent Labs  10/21/15 1850 05/21/16 1300 07/13/16 0710  AST 22 25 15   ALT 18 18 14   ALKPHOS 44 47 46  BILITOT 0.6 0.5 0.6  PROT 6.9 6.7 6.4*  ALBUMIN 4.0 3.9 3.6    Recent Labs  10/21/15 1850 05/21/16 1300 07/13/16 0710  WBC 10.4 8.9 9.2  NEUTROABS 5.3 5.7 4.7  HGB 14.3 14.3 13.6  HCT 42.9 44.2 40.4  MCV 90.6 91.2 89.1  PLT 294 292 308   Lab Results  Component Value Date   TSH 1.359 05/21/2016   Lab Results  Component Value Date   HGBA1C 6.8 (H) 05/21/2016   Lab Results  Component Value Date   CHOL 170 10/21/2015   HDL 40 (L) 10/21/2015   LDLCALC 92 10/21/2015   TRIG 188 (H) 10/21/2015   CHOLHDL 4.3 10/21/2015    Significant Diagnostic Results in last 30 days:  No results found.  Assessment/Plan 1. Generalized weakness  Check UA  Open to Hospice services  D/C non-essential medications  Frequent rounding/checking on pt  Family/ staff Communication:   Total Time:   Documentation:   Face to Face:   Family/Phone:    Labs/tests ordered:    Medication list reviewed and assessed for continued appropriateness. Monthly medication orders reviewed and signed.  Vikki Ports, NP-C Geriatrics [facility] [phone], [place] [address] Cell Phone (Mon-Fri 8am-5pm):  [phone] On Call:  [phone] & follow prompts after 5pm & weekends Office Phone:  [phone] Office Fax:  [phone]

## 2016-08-14 DIAGNOSIS — E119 Type 2 diabetes mellitus without complications: Secondary | ICD-10-CM | POA: Diagnosis not present

## 2016-08-14 DIAGNOSIS — K219 Gastro-esophageal reflux disease without esophagitis: Secondary | ICD-10-CM | POA: Diagnosis not present

## 2016-08-14 DIAGNOSIS — G309 Alzheimer's disease, unspecified: Secondary | ICD-10-CM | POA: Diagnosis not present

## 2016-08-14 DIAGNOSIS — I4891 Unspecified atrial fibrillation: Secondary | ICD-10-CM | POA: Diagnosis not present

## 2016-08-14 DIAGNOSIS — J449 Chronic obstructive pulmonary disease, unspecified: Secondary | ICD-10-CM | POA: Diagnosis not present

## 2016-08-14 DIAGNOSIS — E039 Hypothyroidism, unspecified: Secondary | ICD-10-CM | POA: Diagnosis not present

## 2016-08-14 DIAGNOSIS — I10 Essential (primary) hypertension: Secondary | ICD-10-CM | POA: Diagnosis not present

## 2016-08-14 DIAGNOSIS — Z8551 Personal history of malignant neoplasm of bladder: Secondary | ICD-10-CM | POA: Diagnosis not present

## 2016-08-14 DIAGNOSIS — R627 Adult failure to thrive: Secondary | ICD-10-CM | POA: Diagnosis not present

## 2016-08-14 DIAGNOSIS — Z85118 Personal history of other malignant neoplasm of bronchus and lung: Secondary | ICD-10-CM | POA: Diagnosis not present

## 2016-08-14 DIAGNOSIS — E46 Unspecified protein-calorie malnutrition: Secondary | ICD-10-CM | POA: Diagnosis not present

## 2016-08-14 DIAGNOSIS — D649 Anemia, unspecified: Secondary | ICD-10-CM | POA: Diagnosis not present

## 2016-08-15 DIAGNOSIS — J449 Chronic obstructive pulmonary disease, unspecified: Secondary | ICD-10-CM | POA: Diagnosis not present

## 2016-08-15 DIAGNOSIS — R627 Adult failure to thrive: Secondary | ICD-10-CM | POA: Diagnosis not present

## 2016-08-15 DIAGNOSIS — Z85118 Personal history of other malignant neoplasm of bronchus and lung: Secondary | ICD-10-CM | POA: Diagnosis not present

## 2016-08-15 DIAGNOSIS — G309 Alzheimer's disease, unspecified: Secondary | ICD-10-CM | POA: Diagnosis not present

## 2016-08-15 DIAGNOSIS — E119 Type 2 diabetes mellitus without complications: Secondary | ICD-10-CM | POA: Diagnosis not present

## 2016-08-15 DIAGNOSIS — E039 Hypothyroidism, unspecified: Secondary | ICD-10-CM | POA: Diagnosis not present

## 2016-08-15 DIAGNOSIS — I4891 Unspecified atrial fibrillation: Secondary | ICD-10-CM | POA: Diagnosis not present

## 2016-08-15 DIAGNOSIS — K219 Gastro-esophageal reflux disease without esophagitis: Secondary | ICD-10-CM | POA: Diagnosis not present

## 2016-08-15 DIAGNOSIS — E46 Unspecified protein-calorie malnutrition: Secondary | ICD-10-CM | POA: Diagnosis not present

## 2016-08-15 DIAGNOSIS — D649 Anemia, unspecified: Secondary | ICD-10-CM | POA: Diagnosis not present

## 2016-08-15 DIAGNOSIS — Z8551 Personal history of malignant neoplasm of bladder: Secondary | ICD-10-CM | POA: Diagnosis not present

## 2016-08-15 DIAGNOSIS — I10 Essential (primary) hypertension: Secondary | ICD-10-CM | POA: Diagnosis not present

## 2016-08-15 LAB — URINE CULTURE

## 2016-08-16 DIAGNOSIS — Z85118 Personal history of other malignant neoplasm of bronchus and lung: Secondary | ICD-10-CM | POA: Diagnosis not present

## 2016-08-16 DIAGNOSIS — E119 Type 2 diabetes mellitus without complications: Secondary | ICD-10-CM | POA: Diagnosis not present

## 2016-08-16 DIAGNOSIS — K219 Gastro-esophageal reflux disease without esophagitis: Secondary | ICD-10-CM | POA: Diagnosis not present

## 2016-08-16 DIAGNOSIS — R2681 Unsteadiness on feet: Secondary | ICD-10-CM | POA: Diagnosis not present

## 2016-08-16 DIAGNOSIS — Z8551 Personal history of malignant neoplasm of bladder: Secondary | ICD-10-CM | POA: Diagnosis not present

## 2016-08-16 DIAGNOSIS — G309 Alzheimer's disease, unspecified: Secondary | ICD-10-CM | POA: Diagnosis not present

## 2016-08-16 DIAGNOSIS — R627 Adult failure to thrive: Secondary | ICD-10-CM | POA: Diagnosis not present

## 2016-08-16 DIAGNOSIS — J449 Chronic obstructive pulmonary disease, unspecified: Secondary | ICD-10-CM | POA: Diagnosis not present

## 2016-08-16 DIAGNOSIS — D649 Anemia, unspecified: Secondary | ICD-10-CM | POA: Diagnosis not present

## 2016-08-16 DIAGNOSIS — E46 Unspecified protein-calorie malnutrition: Secondary | ICD-10-CM | POA: Diagnosis not present

## 2016-08-16 DIAGNOSIS — M6281 Muscle weakness (generalized): Secondary | ICD-10-CM | POA: Diagnosis not present

## 2016-08-16 DIAGNOSIS — E039 Hypothyroidism, unspecified: Secondary | ICD-10-CM | POA: Diagnosis not present

## 2016-08-16 DIAGNOSIS — I4891 Unspecified atrial fibrillation: Secondary | ICD-10-CM | POA: Diagnosis not present

## 2016-08-16 DIAGNOSIS — I10 Essential (primary) hypertension: Secondary | ICD-10-CM | POA: Diagnosis not present

## 2016-08-17 DIAGNOSIS — Z8551 Personal history of malignant neoplasm of bladder: Secondary | ICD-10-CM | POA: Diagnosis not present

## 2016-08-17 DIAGNOSIS — R627 Adult failure to thrive: Secondary | ICD-10-CM | POA: Diagnosis not present

## 2016-08-17 DIAGNOSIS — E119 Type 2 diabetes mellitus without complications: Secondary | ICD-10-CM | POA: Diagnosis not present

## 2016-08-17 DIAGNOSIS — K219 Gastro-esophageal reflux disease without esophagitis: Secondary | ICD-10-CM | POA: Diagnosis not present

## 2016-08-17 DIAGNOSIS — I4891 Unspecified atrial fibrillation: Secondary | ICD-10-CM | POA: Diagnosis not present

## 2016-08-17 DIAGNOSIS — Z85118 Personal history of other malignant neoplasm of bronchus and lung: Secondary | ICD-10-CM | POA: Diagnosis not present

## 2016-08-17 DIAGNOSIS — D649 Anemia, unspecified: Secondary | ICD-10-CM | POA: Diagnosis not present

## 2016-08-17 DIAGNOSIS — G309 Alzheimer's disease, unspecified: Secondary | ICD-10-CM | POA: Diagnosis not present

## 2016-08-17 DIAGNOSIS — M6281 Muscle weakness (generalized): Secondary | ICD-10-CM | POA: Diagnosis not present

## 2016-08-17 DIAGNOSIS — R2681 Unsteadiness on feet: Secondary | ICD-10-CM | POA: Diagnosis not present

## 2016-08-17 DIAGNOSIS — E039 Hypothyroidism, unspecified: Secondary | ICD-10-CM | POA: Diagnosis not present

## 2016-08-17 DIAGNOSIS — J449 Chronic obstructive pulmonary disease, unspecified: Secondary | ICD-10-CM | POA: Diagnosis not present

## 2016-08-17 DIAGNOSIS — E46 Unspecified protein-calorie malnutrition: Secondary | ICD-10-CM | POA: Diagnosis not present

## 2016-08-17 DIAGNOSIS — I10 Essential (primary) hypertension: Secondary | ICD-10-CM | POA: Diagnosis not present

## 2016-08-18 DIAGNOSIS — Z85118 Personal history of other malignant neoplasm of bronchus and lung: Secondary | ICD-10-CM | POA: Diagnosis not present

## 2016-08-18 DIAGNOSIS — E46 Unspecified protein-calorie malnutrition: Secondary | ICD-10-CM | POA: Diagnosis not present

## 2016-08-18 DIAGNOSIS — D649 Anemia, unspecified: Secondary | ICD-10-CM | POA: Diagnosis not present

## 2016-08-18 DIAGNOSIS — G309 Alzheimer's disease, unspecified: Secondary | ICD-10-CM | POA: Diagnosis not present

## 2016-08-18 DIAGNOSIS — K219 Gastro-esophageal reflux disease without esophagitis: Secondary | ICD-10-CM | POA: Diagnosis not present

## 2016-08-18 DIAGNOSIS — J449 Chronic obstructive pulmonary disease, unspecified: Secondary | ICD-10-CM | POA: Diagnosis not present

## 2016-08-18 DIAGNOSIS — R627 Adult failure to thrive: Secondary | ICD-10-CM | POA: Diagnosis not present

## 2016-08-18 DIAGNOSIS — Z8551 Personal history of malignant neoplasm of bladder: Secondary | ICD-10-CM | POA: Diagnosis not present

## 2016-08-18 DIAGNOSIS — I4891 Unspecified atrial fibrillation: Secondary | ICD-10-CM | POA: Diagnosis not present

## 2016-08-18 DIAGNOSIS — E039 Hypothyroidism, unspecified: Secondary | ICD-10-CM | POA: Diagnosis not present

## 2016-08-18 DIAGNOSIS — E119 Type 2 diabetes mellitus without complications: Secondary | ICD-10-CM | POA: Diagnosis not present

## 2016-08-18 DIAGNOSIS — I10 Essential (primary) hypertension: Secondary | ICD-10-CM | POA: Diagnosis not present

## 2016-08-19 DIAGNOSIS — E119 Type 2 diabetes mellitus without complications: Secondary | ICD-10-CM | POA: Diagnosis not present

## 2016-08-19 DIAGNOSIS — I10 Essential (primary) hypertension: Secondary | ICD-10-CM | POA: Diagnosis not present

## 2016-08-19 DIAGNOSIS — D649 Anemia, unspecified: Secondary | ICD-10-CM | POA: Diagnosis not present

## 2016-08-19 DIAGNOSIS — G309 Alzheimer's disease, unspecified: Secondary | ICD-10-CM | POA: Diagnosis not present

## 2016-08-19 DIAGNOSIS — I4891 Unspecified atrial fibrillation: Secondary | ICD-10-CM | POA: Diagnosis not present

## 2016-08-19 DIAGNOSIS — Z8551 Personal history of malignant neoplasm of bladder: Secondary | ICD-10-CM | POA: Diagnosis not present

## 2016-08-19 DIAGNOSIS — E46 Unspecified protein-calorie malnutrition: Secondary | ICD-10-CM | POA: Diagnosis not present

## 2016-08-19 DIAGNOSIS — R627 Adult failure to thrive: Secondary | ICD-10-CM | POA: Diagnosis not present

## 2016-08-19 DIAGNOSIS — Z85118 Personal history of other malignant neoplasm of bronchus and lung: Secondary | ICD-10-CM | POA: Diagnosis not present

## 2016-08-19 DIAGNOSIS — J449 Chronic obstructive pulmonary disease, unspecified: Secondary | ICD-10-CM | POA: Diagnosis not present

## 2016-08-19 DIAGNOSIS — K219 Gastro-esophageal reflux disease without esophagitis: Secondary | ICD-10-CM | POA: Diagnosis not present

## 2016-08-19 DIAGNOSIS — E039 Hypothyroidism, unspecified: Secondary | ICD-10-CM | POA: Diagnosis not present

## 2016-08-20 DIAGNOSIS — I10 Essential (primary) hypertension: Secondary | ICD-10-CM | POA: Diagnosis not present

## 2016-08-20 DIAGNOSIS — D649 Anemia, unspecified: Secondary | ICD-10-CM | POA: Diagnosis not present

## 2016-08-20 DIAGNOSIS — K219 Gastro-esophageal reflux disease without esophagitis: Secondary | ICD-10-CM | POA: Diagnosis not present

## 2016-08-20 DIAGNOSIS — E039 Hypothyroidism, unspecified: Secondary | ICD-10-CM | POA: Diagnosis not present

## 2016-08-20 DIAGNOSIS — M6281 Muscle weakness (generalized): Secondary | ICD-10-CM | POA: Diagnosis not present

## 2016-08-20 DIAGNOSIS — J449 Chronic obstructive pulmonary disease, unspecified: Secondary | ICD-10-CM | POA: Diagnosis not present

## 2016-08-20 DIAGNOSIS — Z8551 Personal history of malignant neoplasm of bladder: Secondary | ICD-10-CM | POA: Diagnosis not present

## 2016-08-20 DIAGNOSIS — I4891 Unspecified atrial fibrillation: Secondary | ICD-10-CM | POA: Diagnosis not present

## 2016-08-20 DIAGNOSIS — R2681 Unsteadiness on feet: Secondary | ICD-10-CM | POA: Diagnosis not present

## 2016-08-20 DIAGNOSIS — G309 Alzheimer's disease, unspecified: Secondary | ICD-10-CM | POA: Diagnosis not present

## 2016-08-20 DIAGNOSIS — E46 Unspecified protein-calorie malnutrition: Secondary | ICD-10-CM | POA: Diagnosis not present

## 2016-08-20 DIAGNOSIS — E119 Type 2 diabetes mellitus without complications: Secondary | ICD-10-CM | POA: Diagnosis not present

## 2016-08-20 DIAGNOSIS — R627 Adult failure to thrive: Secondary | ICD-10-CM | POA: Diagnosis not present

## 2016-08-20 DIAGNOSIS — Z85118 Personal history of other malignant neoplasm of bronchus and lung: Secondary | ICD-10-CM | POA: Diagnosis not present

## 2016-08-21 DIAGNOSIS — E46 Unspecified protein-calorie malnutrition: Secondary | ICD-10-CM | POA: Diagnosis not present

## 2016-08-21 DIAGNOSIS — K219 Gastro-esophageal reflux disease without esophagitis: Secondary | ICD-10-CM | POA: Diagnosis not present

## 2016-08-21 DIAGNOSIS — E119 Type 2 diabetes mellitus without complications: Secondary | ICD-10-CM | POA: Diagnosis not present

## 2016-08-21 DIAGNOSIS — I10 Essential (primary) hypertension: Secondary | ICD-10-CM | POA: Diagnosis not present

## 2016-08-21 DIAGNOSIS — Z85118 Personal history of other malignant neoplasm of bronchus and lung: Secondary | ICD-10-CM | POA: Diagnosis not present

## 2016-08-21 DIAGNOSIS — R2681 Unsteadiness on feet: Secondary | ICD-10-CM | POA: Diagnosis not present

## 2016-08-21 DIAGNOSIS — I4891 Unspecified atrial fibrillation: Secondary | ICD-10-CM | POA: Diagnosis not present

## 2016-08-21 DIAGNOSIS — E039 Hypothyroidism, unspecified: Secondary | ICD-10-CM | POA: Diagnosis not present

## 2016-08-21 DIAGNOSIS — J449 Chronic obstructive pulmonary disease, unspecified: Secondary | ICD-10-CM | POA: Diagnosis not present

## 2016-08-21 DIAGNOSIS — G309 Alzheimer's disease, unspecified: Secondary | ICD-10-CM | POA: Diagnosis not present

## 2016-08-21 DIAGNOSIS — D649 Anemia, unspecified: Secondary | ICD-10-CM | POA: Diagnosis not present

## 2016-08-21 DIAGNOSIS — M6281 Muscle weakness (generalized): Secondary | ICD-10-CM | POA: Diagnosis not present

## 2016-08-21 DIAGNOSIS — R627 Adult failure to thrive: Secondary | ICD-10-CM | POA: Diagnosis not present

## 2016-08-21 DIAGNOSIS — Z8551 Personal history of malignant neoplasm of bladder: Secondary | ICD-10-CM | POA: Diagnosis not present

## 2016-08-22 ENCOUNTER — Encounter
Admission: RE | Admit: 2016-08-22 | Discharge: 2016-08-22 | Disposition: A | Discharge status: Home / Self Care | Payer: Medicare Other | Source: Ambulatory Visit | Attending: Internal Medicine | Admitting: Internal Medicine

## 2016-08-22 DIAGNOSIS — K219 Gastro-esophageal reflux disease without esophagitis: Secondary | ICD-10-CM | POA: Diagnosis not present

## 2016-08-22 DIAGNOSIS — I10 Essential (primary) hypertension: Secondary | ICD-10-CM | POA: Diagnosis not present

## 2016-08-22 DIAGNOSIS — E119 Type 2 diabetes mellitus without complications: Secondary | ICD-10-CM | POA: Diagnosis not present

## 2016-08-22 DIAGNOSIS — J449 Chronic obstructive pulmonary disease, unspecified: Secondary | ICD-10-CM | POA: Diagnosis not present

## 2016-08-22 DIAGNOSIS — R627 Adult failure to thrive: Secondary | ICD-10-CM | POA: Diagnosis not present

## 2016-08-22 DIAGNOSIS — D649 Anemia, unspecified: Secondary | ICD-10-CM | POA: Diagnosis not present

## 2016-08-22 DIAGNOSIS — I4891 Unspecified atrial fibrillation: Secondary | ICD-10-CM | POA: Diagnosis not present

## 2016-08-22 DIAGNOSIS — E46 Unspecified protein-calorie malnutrition: Secondary | ICD-10-CM | POA: Diagnosis not present

## 2016-08-22 DIAGNOSIS — E039 Hypothyroidism, unspecified: Secondary | ICD-10-CM | POA: Diagnosis not present

## 2016-08-22 DIAGNOSIS — Z85118 Personal history of other malignant neoplasm of bronchus and lung: Secondary | ICD-10-CM | POA: Diagnosis not present

## 2016-08-22 DIAGNOSIS — G309 Alzheimer's disease, unspecified: Secondary | ICD-10-CM | POA: Diagnosis not present

## 2016-08-22 DIAGNOSIS — Z8551 Personal history of malignant neoplasm of bladder: Secondary | ICD-10-CM | POA: Diagnosis not present

## 2016-08-23 DIAGNOSIS — I4891 Unspecified atrial fibrillation: Secondary | ICD-10-CM | POA: Diagnosis not present

## 2016-08-23 DIAGNOSIS — G309 Alzheimer's disease, unspecified: Secondary | ICD-10-CM | POA: Diagnosis not present

## 2016-08-23 DIAGNOSIS — J449 Chronic obstructive pulmonary disease, unspecified: Secondary | ICD-10-CM | POA: Diagnosis not present

## 2016-08-23 DIAGNOSIS — E039 Hypothyroidism, unspecified: Secondary | ICD-10-CM | POA: Diagnosis not present

## 2016-08-23 DIAGNOSIS — R627 Adult failure to thrive: Secondary | ICD-10-CM | POA: Diagnosis not present

## 2016-08-23 DIAGNOSIS — E119 Type 2 diabetes mellitus without complications: Secondary | ICD-10-CM | POA: Diagnosis not present

## 2016-08-23 DIAGNOSIS — I10 Essential (primary) hypertension: Secondary | ICD-10-CM | POA: Diagnosis not present

## 2016-08-23 DIAGNOSIS — Z8551 Personal history of malignant neoplasm of bladder: Secondary | ICD-10-CM | POA: Diagnosis not present

## 2016-08-23 DIAGNOSIS — E46 Unspecified protein-calorie malnutrition: Secondary | ICD-10-CM | POA: Diagnosis not present

## 2016-08-23 DIAGNOSIS — K219 Gastro-esophageal reflux disease without esophagitis: Secondary | ICD-10-CM | POA: Diagnosis not present

## 2016-08-23 DIAGNOSIS — Z85118 Personal history of other malignant neoplasm of bronchus and lung: Secondary | ICD-10-CM | POA: Diagnosis not present

## 2016-08-23 DIAGNOSIS — D649 Anemia, unspecified: Secondary | ICD-10-CM | POA: Diagnosis not present

## 2016-08-24 DIAGNOSIS — Z85118 Personal history of other malignant neoplasm of bronchus and lung: Secondary | ICD-10-CM | POA: Diagnosis not present

## 2016-08-24 DIAGNOSIS — E039 Hypothyroidism, unspecified: Secondary | ICD-10-CM | POA: Diagnosis not present

## 2016-08-24 DIAGNOSIS — E119 Type 2 diabetes mellitus without complications: Secondary | ICD-10-CM | POA: Diagnosis not present

## 2016-08-24 DIAGNOSIS — I10 Essential (primary) hypertension: Secondary | ICD-10-CM | POA: Diagnosis not present

## 2016-08-24 DIAGNOSIS — I4891 Unspecified atrial fibrillation: Secondary | ICD-10-CM | POA: Diagnosis not present

## 2016-08-24 DIAGNOSIS — E46 Unspecified protein-calorie malnutrition: Secondary | ICD-10-CM | POA: Diagnosis not present

## 2016-08-24 DIAGNOSIS — Z8551 Personal history of malignant neoplasm of bladder: Secondary | ICD-10-CM | POA: Diagnosis not present

## 2016-08-24 DIAGNOSIS — D649 Anemia, unspecified: Secondary | ICD-10-CM | POA: Diagnosis not present

## 2016-08-24 DIAGNOSIS — R627 Adult failure to thrive: Secondary | ICD-10-CM | POA: Diagnosis not present

## 2016-08-24 DIAGNOSIS — K219 Gastro-esophageal reflux disease without esophagitis: Secondary | ICD-10-CM | POA: Diagnosis not present

## 2016-08-24 DIAGNOSIS — J449 Chronic obstructive pulmonary disease, unspecified: Secondary | ICD-10-CM | POA: Diagnosis not present

## 2016-08-24 DIAGNOSIS — G309 Alzheimer's disease, unspecified: Secondary | ICD-10-CM | POA: Diagnosis not present

## 2016-08-25 DIAGNOSIS — E039 Hypothyroidism, unspecified: Secondary | ICD-10-CM | POA: Diagnosis not present

## 2016-08-25 DIAGNOSIS — G309 Alzheimer's disease, unspecified: Secondary | ICD-10-CM | POA: Diagnosis not present

## 2016-08-25 DIAGNOSIS — I4891 Unspecified atrial fibrillation: Secondary | ICD-10-CM | POA: Diagnosis not present

## 2016-08-25 DIAGNOSIS — Z8551 Personal history of malignant neoplasm of bladder: Secondary | ICD-10-CM | POA: Diagnosis not present

## 2016-08-25 DIAGNOSIS — Z85118 Personal history of other malignant neoplasm of bronchus and lung: Secondary | ICD-10-CM | POA: Diagnosis not present

## 2016-08-25 DIAGNOSIS — R627 Adult failure to thrive: Secondary | ICD-10-CM | POA: Diagnosis not present

## 2016-08-25 DIAGNOSIS — K219 Gastro-esophageal reflux disease without esophagitis: Secondary | ICD-10-CM | POA: Diagnosis not present

## 2016-08-25 DIAGNOSIS — E46 Unspecified protein-calorie malnutrition: Secondary | ICD-10-CM | POA: Diagnosis not present

## 2016-08-25 DIAGNOSIS — E119 Type 2 diabetes mellitus without complications: Secondary | ICD-10-CM | POA: Diagnosis not present

## 2016-08-25 DIAGNOSIS — D649 Anemia, unspecified: Secondary | ICD-10-CM | POA: Diagnosis not present

## 2016-08-25 DIAGNOSIS — I10 Essential (primary) hypertension: Secondary | ICD-10-CM | POA: Diagnosis not present

## 2016-08-25 DIAGNOSIS — J449 Chronic obstructive pulmonary disease, unspecified: Secondary | ICD-10-CM | POA: Diagnosis not present

## 2016-08-26 DIAGNOSIS — G309 Alzheimer's disease, unspecified: Secondary | ICD-10-CM | POA: Diagnosis not present

## 2016-08-26 DIAGNOSIS — J449 Chronic obstructive pulmonary disease, unspecified: Secondary | ICD-10-CM | POA: Diagnosis not present

## 2016-08-26 DIAGNOSIS — K219 Gastro-esophageal reflux disease without esophagitis: Secondary | ICD-10-CM | POA: Diagnosis not present

## 2016-08-26 DIAGNOSIS — R627 Adult failure to thrive: Secondary | ICD-10-CM | POA: Diagnosis not present

## 2016-08-26 DIAGNOSIS — I4891 Unspecified atrial fibrillation: Secondary | ICD-10-CM | POA: Diagnosis not present

## 2016-08-26 DIAGNOSIS — E119 Type 2 diabetes mellitus without complications: Secondary | ICD-10-CM | POA: Diagnosis not present

## 2016-08-26 DIAGNOSIS — I10 Essential (primary) hypertension: Secondary | ICD-10-CM | POA: Diagnosis not present

## 2016-08-26 DIAGNOSIS — E46 Unspecified protein-calorie malnutrition: Secondary | ICD-10-CM | POA: Diagnosis not present

## 2016-08-26 DIAGNOSIS — D649 Anemia, unspecified: Secondary | ICD-10-CM | POA: Diagnosis not present

## 2016-08-26 DIAGNOSIS — E039 Hypothyroidism, unspecified: Secondary | ICD-10-CM | POA: Diagnosis not present

## 2016-08-26 DIAGNOSIS — Z85118 Personal history of other malignant neoplasm of bronchus and lung: Secondary | ICD-10-CM | POA: Diagnosis not present

## 2016-08-26 DIAGNOSIS — Z8551 Personal history of malignant neoplasm of bladder: Secondary | ICD-10-CM | POA: Diagnosis not present

## 2016-08-27 DIAGNOSIS — D649 Anemia, unspecified: Secondary | ICD-10-CM | POA: Diagnosis not present

## 2016-08-27 DIAGNOSIS — I4891 Unspecified atrial fibrillation: Secondary | ICD-10-CM | POA: Diagnosis not present

## 2016-08-27 DIAGNOSIS — R627 Adult failure to thrive: Secondary | ICD-10-CM | POA: Diagnosis not present

## 2016-08-27 DIAGNOSIS — E1122 Type 2 diabetes mellitus with diabetic chronic kidney disease: Secondary | ICD-10-CM | POA: Diagnosis not present

## 2016-08-27 DIAGNOSIS — Z85118 Personal history of other malignant neoplasm of bronchus and lung: Secondary | ICD-10-CM | POA: Diagnosis not present

## 2016-08-27 DIAGNOSIS — G309 Alzheimer's disease, unspecified: Secondary | ICD-10-CM | POA: Diagnosis not present

## 2016-08-27 DIAGNOSIS — Z0000 Encounter for general adult medical examination without abnormal findings: Secondary | ICD-10-CM | POA: Diagnosis not present

## 2016-08-27 DIAGNOSIS — J410 Simple chronic bronchitis: Secondary | ICD-10-CM | POA: Diagnosis not present

## 2016-08-27 DIAGNOSIS — I10 Essential (primary) hypertension: Secondary | ICD-10-CM | POA: Diagnosis not present

## 2016-08-27 DIAGNOSIS — E46 Unspecified protein-calorie malnutrition: Secondary | ICD-10-CM | POA: Diagnosis not present

## 2016-08-27 DIAGNOSIS — N183 Chronic kidney disease, stage 3 (moderate): Secondary | ICD-10-CM | POA: Diagnosis not present

## 2016-08-27 DIAGNOSIS — J449 Chronic obstructive pulmonary disease, unspecified: Secondary | ICD-10-CM | POA: Diagnosis not present

## 2016-08-27 DIAGNOSIS — K219 Gastro-esophageal reflux disease without esophagitis: Secondary | ICD-10-CM | POA: Diagnosis not present

## 2016-08-27 DIAGNOSIS — E119 Type 2 diabetes mellitus without complications: Secondary | ICD-10-CM | POA: Diagnosis not present

## 2016-08-27 DIAGNOSIS — Z8551 Personal history of malignant neoplasm of bladder: Secondary | ICD-10-CM | POA: Diagnosis not present

## 2016-08-27 DIAGNOSIS — E039 Hypothyroidism, unspecified: Secondary | ICD-10-CM | POA: Diagnosis not present

## 2016-08-28 DIAGNOSIS — Z8551 Personal history of malignant neoplasm of bladder: Secondary | ICD-10-CM | POA: Diagnosis not present

## 2016-08-28 DIAGNOSIS — I10 Essential (primary) hypertension: Secondary | ICD-10-CM | POA: Diagnosis not present

## 2016-08-28 DIAGNOSIS — R627 Adult failure to thrive: Secondary | ICD-10-CM | POA: Diagnosis not present

## 2016-08-28 DIAGNOSIS — Z85118 Personal history of other malignant neoplasm of bronchus and lung: Secondary | ICD-10-CM | POA: Diagnosis not present

## 2016-08-28 DIAGNOSIS — E119 Type 2 diabetes mellitus without complications: Secondary | ICD-10-CM | POA: Diagnosis not present

## 2016-08-28 DIAGNOSIS — I4891 Unspecified atrial fibrillation: Secondary | ICD-10-CM | POA: Diagnosis not present

## 2016-08-28 DIAGNOSIS — J449 Chronic obstructive pulmonary disease, unspecified: Secondary | ICD-10-CM | POA: Diagnosis not present

## 2016-08-28 DIAGNOSIS — D649 Anemia, unspecified: Secondary | ICD-10-CM | POA: Diagnosis not present

## 2016-08-28 DIAGNOSIS — E46 Unspecified protein-calorie malnutrition: Secondary | ICD-10-CM | POA: Diagnosis not present

## 2016-08-28 DIAGNOSIS — G309 Alzheimer's disease, unspecified: Secondary | ICD-10-CM | POA: Diagnosis not present

## 2016-08-28 DIAGNOSIS — E039 Hypothyroidism, unspecified: Secondary | ICD-10-CM | POA: Diagnosis not present

## 2016-08-28 DIAGNOSIS — K219 Gastro-esophageal reflux disease without esophagitis: Secondary | ICD-10-CM | POA: Diagnosis not present

## 2016-08-29 DIAGNOSIS — I10 Essential (primary) hypertension: Secondary | ICD-10-CM | POA: Diagnosis not present

## 2016-08-29 DIAGNOSIS — Z85118 Personal history of other malignant neoplasm of bronchus and lung: Secondary | ICD-10-CM | POA: Diagnosis not present

## 2016-08-29 DIAGNOSIS — R627 Adult failure to thrive: Secondary | ICD-10-CM | POA: Diagnosis not present

## 2016-08-29 DIAGNOSIS — E039 Hypothyroidism, unspecified: Secondary | ICD-10-CM | POA: Diagnosis not present

## 2016-08-29 DIAGNOSIS — K219 Gastro-esophageal reflux disease without esophagitis: Secondary | ICD-10-CM | POA: Diagnosis not present

## 2016-08-29 DIAGNOSIS — D649 Anemia, unspecified: Secondary | ICD-10-CM | POA: Diagnosis not present

## 2016-08-29 DIAGNOSIS — E119 Type 2 diabetes mellitus without complications: Secondary | ICD-10-CM | POA: Diagnosis not present

## 2016-08-29 DIAGNOSIS — Z8551 Personal history of malignant neoplasm of bladder: Secondary | ICD-10-CM | POA: Diagnosis not present

## 2016-08-29 DIAGNOSIS — E46 Unspecified protein-calorie malnutrition: Secondary | ICD-10-CM | POA: Diagnosis not present

## 2016-08-29 DIAGNOSIS — G309 Alzheimer's disease, unspecified: Secondary | ICD-10-CM | POA: Diagnosis not present

## 2016-08-29 DIAGNOSIS — J449 Chronic obstructive pulmonary disease, unspecified: Secondary | ICD-10-CM | POA: Diagnosis not present

## 2016-08-29 DIAGNOSIS — I4891 Unspecified atrial fibrillation: Secondary | ICD-10-CM | POA: Diagnosis not present

## 2016-08-30 DIAGNOSIS — Z85118 Personal history of other malignant neoplasm of bronchus and lung: Secondary | ICD-10-CM | POA: Diagnosis not present

## 2016-08-30 DIAGNOSIS — D649 Anemia, unspecified: Secondary | ICD-10-CM | POA: Diagnosis not present

## 2016-08-30 DIAGNOSIS — E119 Type 2 diabetes mellitus without complications: Secondary | ICD-10-CM | POA: Diagnosis not present

## 2016-08-30 DIAGNOSIS — J449 Chronic obstructive pulmonary disease, unspecified: Secondary | ICD-10-CM | POA: Diagnosis not present

## 2016-08-30 DIAGNOSIS — E039 Hypothyroidism, unspecified: Secondary | ICD-10-CM | POA: Diagnosis not present

## 2016-08-30 DIAGNOSIS — I10 Essential (primary) hypertension: Secondary | ICD-10-CM | POA: Diagnosis not present

## 2016-08-30 DIAGNOSIS — E46 Unspecified protein-calorie malnutrition: Secondary | ICD-10-CM | POA: Diagnosis not present

## 2016-08-30 DIAGNOSIS — K219 Gastro-esophageal reflux disease without esophagitis: Secondary | ICD-10-CM | POA: Diagnosis not present

## 2016-08-30 DIAGNOSIS — I4891 Unspecified atrial fibrillation: Secondary | ICD-10-CM | POA: Diagnosis not present

## 2016-08-30 DIAGNOSIS — R627 Adult failure to thrive: Secondary | ICD-10-CM | POA: Diagnosis not present

## 2016-08-30 DIAGNOSIS — Z8551 Personal history of malignant neoplasm of bladder: Secondary | ICD-10-CM | POA: Diagnosis not present

## 2016-08-30 DIAGNOSIS — G309 Alzheimer's disease, unspecified: Secondary | ICD-10-CM | POA: Diagnosis not present

## 2016-08-31 DIAGNOSIS — G309 Alzheimer's disease, unspecified: Secondary | ICD-10-CM | POA: Diagnosis not present

## 2016-08-31 DIAGNOSIS — E039 Hypothyroidism, unspecified: Secondary | ICD-10-CM | POA: Diagnosis not present

## 2016-08-31 DIAGNOSIS — E119 Type 2 diabetes mellitus without complications: Secondary | ICD-10-CM | POA: Diagnosis not present

## 2016-08-31 DIAGNOSIS — I4891 Unspecified atrial fibrillation: Secondary | ICD-10-CM | POA: Diagnosis not present

## 2016-08-31 DIAGNOSIS — E46 Unspecified protein-calorie malnutrition: Secondary | ICD-10-CM | POA: Diagnosis not present

## 2016-08-31 DIAGNOSIS — I10 Essential (primary) hypertension: Secondary | ICD-10-CM | POA: Diagnosis not present

## 2016-08-31 DIAGNOSIS — Z8551 Personal history of malignant neoplasm of bladder: Secondary | ICD-10-CM | POA: Diagnosis not present

## 2016-08-31 DIAGNOSIS — K219 Gastro-esophageal reflux disease without esophagitis: Secondary | ICD-10-CM | POA: Diagnosis not present

## 2016-08-31 DIAGNOSIS — D649 Anemia, unspecified: Secondary | ICD-10-CM | POA: Diagnosis not present

## 2016-08-31 DIAGNOSIS — R627 Adult failure to thrive: Secondary | ICD-10-CM | POA: Diagnosis not present

## 2016-08-31 DIAGNOSIS — J449 Chronic obstructive pulmonary disease, unspecified: Secondary | ICD-10-CM | POA: Diagnosis not present

## 2016-08-31 DIAGNOSIS — Z85118 Personal history of other malignant neoplasm of bronchus and lung: Secondary | ICD-10-CM | POA: Diagnosis not present

## 2016-09-01 DIAGNOSIS — E119 Type 2 diabetes mellitus without complications: Secondary | ICD-10-CM | POA: Diagnosis not present

## 2016-09-01 DIAGNOSIS — I10 Essential (primary) hypertension: Secondary | ICD-10-CM | POA: Diagnosis not present

## 2016-09-01 DIAGNOSIS — D649 Anemia, unspecified: Secondary | ICD-10-CM | POA: Diagnosis not present

## 2016-09-01 DIAGNOSIS — Z8551 Personal history of malignant neoplasm of bladder: Secondary | ICD-10-CM | POA: Diagnosis not present

## 2016-09-01 DIAGNOSIS — E039 Hypothyroidism, unspecified: Secondary | ICD-10-CM | POA: Diagnosis not present

## 2016-09-01 DIAGNOSIS — E46 Unspecified protein-calorie malnutrition: Secondary | ICD-10-CM | POA: Diagnosis not present

## 2016-09-01 DIAGNOSIS — J449 Chronic obstructive pulmonary disease, unspecified: Secondary | ICD-10-CM | POA: Diagnosis not present

## 2016-09-01 DIAGNOSIS — K219 Gastro-esophageal reflux disease without esophagitis: Secondary | ICD-10-CM | POA: Diagnosis not present

## 2016-09-01 DIAGNOSIS — R627 Adult failure to thrive: Secondary | ICD-10-CM | POA: Diagnosis not present

## 2016-09-01 DIAGNOSIS — G309 Alzheimer's disease, unspecified: Secondary | ICD-10-CM | POA: Diagnosis not present

## 2016-09-01 DIAGNOSIS — I4891 Unspecified atrial fibrillation: Secondary | ICD-10-CM | POA: Diagnosis not present

## 2016-09-01 DIAGNOSIS — Z85118 Personal history of other malignant neoplasm of bronchus and lung: Secondary | ICD-10-CM | POA: Diagnosis not present

## 2016-09-02 DIAGNOSIS — I10 Essential (primary) hypertension: Secondary | ICD-10-CM | POA: Diagnosis not present

## 2016-09-02 DIAGNOSIS — E039 Hypothyroidism, unspecified: Secondary | ICD-10-CM | POA: Diagnosis not present

## 2016-09-02 DIAGNOSIS — G309 Alzheimer's disease, unspecified: Secondary | ICD-10-CM | POA: Diagnosis not present

## 2016-09-02 DIAGNOSIS — J449 Chronic obstructive pulmonary disease, unspecified: Secondary | ICD-10-CM | POA: Diagnosis not present

## 2016-09-02 DIAGNOSIS — Z8551 Personal history of malignant neoplasm of bladder: Secondary | ICD-10-CM | POA: Diagnosis not present

## 2016-09-02 DIAGNOSIS — Z85118 Personal history of other malignant neoplasm of bronchus and lung: Secondary | ICD-10-CM | POA: Diagnosis not present

## 2016-09-02 DIAGNOSIS — D649 Anemia, unspecified: Secondary | ICD-10-CM | POA: Diagnosis not present

## 2016-09-02 DIAGNOSIS — R627 Adult failure to thrive: Secondary | ICD-10-CM | POA: Diagnosis not present

## 2016-09-02 DIAGNOSIS — I4891 Unspecified atrial fibrillation: Secondary | ICD-10-CM | POA: Diagnosis not present

## 2016-09-02 DIAGNOSIS — K219 Gastro-esophageal reflux disease without esophagitis: Secondary | ICD-10-CM | POA: Diagnosis not present

## 2016-09-02 DIAGNOSIS — E119 Type 2 diabetes mellitus without complications: Secondary | ICD-10-CM | POA: Diagnosis not present

## 2016-09-02 DIAGNOSIS — E46 Unspecified protein-calorie malnutrition: Secondary | ICD-10-CM | POA: Diagnosis not present

## 2016-09-03 DIAGNOSIS — G309 Alzheimer's disease, unspecified: Secondary | ICD-10-CM | POA: Diagnosis not present

## 2016-09-03 DIAGNOSIS — K219 Gastro-esophageal reflux disease without esophagitis: Secondary | ICD-10-CM | POA: Diagnosis not present

## 2016-09-03 DIAGNOSIS — I10 Essential (primary) hypertension: Secondary | ICD-10-CM | POA: Diagnosis not present

## 2016-09-03 DIAGNOSIS — Z85118 Personal history of other malignant neoplasm of bronchus and lung: Secondary | ICD-10-CM | POA: Diagnosis not present

## 2016-09-03 DIAGNOSIS — E119 Type 2 diabetes mellitus without complications: Secondary | ICD-10-CM | POA: Diagnosis not present

## 2016-09-03 DIAGNOSIS — R627 Adult failure to thrive: Secondary | ICD-10-CM | POA: Diagnosis not present

## 2016-09-03 DIAGNOSIS — J449 Chronic obstructive pulmonary disease, unspecified: Secondary | ICD-10-CM | POA: Diagnosis not present

## 2016-09-03 DIAGNOSIS — E039 Hypothyroidism, unspecified: Secondary | ICD-10-CM | POA: Diagnosis not present

## 2016-09-03 DIAGNOSIS — Z8551 Personal history of malignant neoplasm of bladder: Secondary | ICD-10-CM | POA: Diagnosis not present

## 2016-09-03 DIAGNOSIS — I4891 Unspecified atrial fibrillation: Secondary | ICD-10-CM | POA: Diagnosis not present

## 2016-09-03 DIAGNOSIS — D649 Anemia, unspecified: Secondary | ICD-10-CM | POA: Diagnosis not present

## 2016-09-03 DIAGNOSIS — E46 Unspecified protein-calorie malnutrition: Secondary | ICD-10-CM | POA: Diagnosis not present

## 2016-09-04 DIAGNOSIS — R627 Adult failure to thrive: Secondary | ICD-10-CM | POA: Diagnosis not present

## 2016-09-04 DIAGNOSIS — E46 Unspecified protein-calorie malnutrition: Secondary | ICD-10-CM | POA: Diagnosis not present

## 2016-09-04 DIAGNOSIS — E119 Type 2 diabetes mellitus without complications: Secondary | ICD-10-CM | POA: Diagnosis not present

## 2016-09-04 DIAGNOSIS — I10 Essential (primary) hypertension: Secondary | ICD-10-CM | POA: Diagnosis not present

## 2016-09-04 DIAGNOSIS — E039 Hypothyroidism, unspecified: Secondary | ICD-10-CM | POA: Diagnosis not present

## 2016-09-04 DIAGNOSIS — I4891 Unspecified atrial fibrillation: Secondary | ICD-10-CM | POA: Diagnosis not present

## 2016-09-04 DIAGNOSIS — J449 Chronic obstructive pulmonary disease, unspecified: Secondary | ICD-10-CM | POA: Diagnosis not present

## 2016-09-04 DIAGNOSIS — G309 Alzheimer's disease, unspecified: Secondary | ICD-10-CM | POA: Diagnosis not present

## 2016-09-04 DIAGNOSIS — Z85118 Personal history of other malignant neoplasm of bronchus and lung: Secondary | ICD-10-CM | POA: Diagnosis not present

## 2016-09-04 DIAGNOSIS — Z8551 Personal history of malignant neoplasm of bladder: Secondary | ICD-10-CM | POA: Diagnosis not present

## 2016-09-04 DIAGNOSIS — K219 Gastro-esophageal reflux disease without esophagitis: Secondary | ICD-10-CM | POA: Diagnosis not present

## 2016-09-04 DIAGNOSIS — D649 Anemia, unspecified: Secondary | ICD-10-CM | POA: Diagnosis not present

## 2016-09-05 DIAGNOSIS — J449 Chronic obstructive pulmonary disease, unspecified: Secondary | ICD-10-CM | POA: Diagnosis not present

## 2016-09-05 DIAGNOSIS — I4891 Unspecified atrial fibrillation: Secondary | ICD-10-CM | POA: Diagnosis not present

## 2016-09-05 DIAGNOSIS — G309 Alzheimer's disease, unspecified: Secondary | ICD-10-CM | POA: Diagnosis not present

## 2016-09-05 DIAGNOSIS — D649 Anemia, unspecified: Secondary | ICD-10-CM | POA: Diagnosis not present

## 2016-09-05 DIAGNOSIS — R627 Adult failure to thrive: Secondary | ICD-10-CM | POA: Diagnosis not present

## 2016-09-05 DIAGNOSIS — Z8551 Personal history of malignant neoplasm of bladder: Secondary | ICD-10-CM | POA: Diagnosis not present

## 2016-09-05 DIAGNOSIS — E039 Hypothyroidism, unspecified: Secondary | ICD-10-CM | POA: Diagnosis not present

## 2016-09-05 DIAGNOSIS — K219 Gastro-esophageal reflux disease without esophagitis: Secondary | ICD-10-CM | POA: Diagnosis not present

## 2016-09-05 DIAGNOSIS — E46 Unspecified protein-calorie malnutrition: Secondary | ICD-10-CM | POA: Diagnosis not present

## 2016-09-05 DIAGNOSIS — E119 Type 2 diabetes mellitus without complications: Secondary | ICD-10-CM | POA: Diagnosis not present

## 2016-09-05 DIAGNOSIS — Z85118 Personal history of other malignant neoplasm of bronchus and lung: Secondary | ICD-10-CM | POA: Diagnosis not present

## 2016-09-05 DIAGNOSIS — I10 Essential (primary) hypertension: Secondary | ICD-10-CM | POA: Diagnosis not present

## 2016-09-06 DIAGNOSIS — J449 Chronic obstructive pulmonary disease, unspecified: Secondary | ICD-10-CM | POA: Diagnosis not present

## 2016-09-06 DIAGNOSIS — K219 Gastro-esophageal reflux disease without esophagitis: Secondary | ICD-10-CM | POA: Diagnosis not present

## 2016-09-06 DIAGNOSIS — I4891 Unspecified atrial fibrillation: Secondary | ICD-10-CM | POA: Diagnosis not present

## 2016-09-06 DIAGNOSIS — D649 Anemia, unspecified: Secondary | ICD-10-CM | POA: Diagnosis not present

## 2016-09-06 DIAGNOSIS — G309 Alzheimer's disease, unspecified: Secondary | ICD-10-CM | POA: Diagnosis not present

## 2016-09-06 DIAGNOSIS — I10 Essential (primary) hypertension: Secondary | ICD-10-CM | POA: Diagnosis not present

## 2016-09-06 DIAGNOSIS — E039 Hypothyroidism, unspecified: Secondary | ICD-10-CM | POA: Diagnosis not present

## 2016-09-06 DIAGNOSIS — Z85118 Personal history of other malignant neoplasm of bronchus and lung: Secondary | ICD-10-CM | POA: Diagnosis not present

## 2016-09-06 DIAGNOSIS — E46 Unspecified protein-calorie malnutrition: Secondary | ICD-10-CM | POA: Diagnosis not present

## 2016-09-06 DIAGNOSIS — E119 Type 2 diabetes mellitus without complications: Secondary | ICD-10-CM | POA: Diagnosis not present

## 2016-09-06 DIAGNOSIS — Z8551 Personal history of malignant neoplasm of bladder: Secondary | ICD-10-CM | POA: Diagnosis not present

## 2016-09-06 DIAGNOSIS — R627 Adult failure to thrive: Secondary | ICD-10-CM | POA: Diagnosis not present

## 2016-09-07 DIAGNOSIS — E46 Unspecified protein-calorie malnutrition: Secondary | ICD-10-CM | POA: Diagnosis not present

## 2016-09-07 DIAGNOSIS — G309 Alzheimer's disease, unspecified: Secondary | ICD-10-CM | POA: Diagnosis not present

## 2016-09-07 DIAGNOSIS — R627 Adult failure to thrive: Secondary | ICD-10-CM | POA: Diagnosis not present

## 2016-09-07 DIAGNOSIS — I4891 Unspecified atrial fibrillation: Secondary | ICD-10-CM | POA: Diagnosis not present

## 2016-09-07 DIAGNOSIS — E119 Type 2 diabetes mellitus without complications: Secondary | ICD-10-CM | POA: Diagnosis not present

## 2016-09-07 DIAGNOSIS — I10 Essential (primary) hypertension: Secondary | ICD-10-CM | POA: Diagnosis not present

## 2016-09-07 DIAGNOSIS — J449 Chronic obstructive pulmonary disease, unspecified: Secondary | ICD-10-CM | POA: Diagnosis not present

## 2016-09-07 DIAGNOSIS — K219 Gastro-esophageal reflux disease without esophagitis: Secondary | ICD-10-CM | POA: Diagnosis not present

## 2016-09-07 DIAGNOSIS — D649 Anemia, unspecified: Secondary | ICD-10-CM | POA: Diagnosis not present

## 2016-09-07 DIAGNOSIS — Z85118 Personal history of other malignant neoplasm of bronchus and lung: Secondary | ICD-10-CM | POA: Diagnosis not present

## 2016-09-07 DIAGNOSIS — E039 Hypothyroidism, unspecified: Secondary | ICD-10-CM | POA: Diagnosis not present

## 2016-09-07 DIAGNOSIS — Z8551 Personal history of malignant neoplasm of bladder: Secondary | ICD-10-CM | POA: Diagnosis not present

## 2016-09-08 DIAGNOSIS — D649 Anemia, unspecified: Secondary | ICD-10-CM | POA: Diagnosis not present

## 2016-09-08 DIAGNOSIS — E039 Hypothyroidism, unspecified: Secondary | ICD-10-CM | POA: Diagnosis not present

## 2016-09-08 DIAGNOSIS — G309 Alzheimer's disease, unspecified: Secondary | ICD-10-CM | POA: Diagnosis not present

## 2016-09-08 DIAGNOSIS — Z85118 Personal history of other malignant neoplasm of bronchus and lung: Secondary | ICD-10-CM | POA: Diagnosis not present

## 2016-09-08 DIAGNOSIS — E119 Type 2 diabetes mellitus without complications: Secondary | ICD-10-CM | POA: Diagnosis not present

## 2016-09-08 DIAGNOSIS — I4891 Unspecified atrial fibrillation: Secondary | ICD-10-CM | POA: Diagnosis not present

## 2016-09-08 DIAGNOSIS — E46 Unspecified protein-calorie malnutrition: Secondary | ICD-10-CM | POA: Diagnosis not present

## 2016-09-08 DIAGNOSIS — J449 Chronic obstructive pulmonary disease, unspecified: Secondary | ICD-10-CM | POA: Diagnosis not present

## 2016-09-08 DIAGNOSIS — I10 Essential (primary) hypertension: Secondary | ICD-10-CM | POA: Diagnosis not present

## 2016-09-08 DIAGNOSIS — K219 Gastro-esophageal reflux disease without esophagitis: Secondary | ICD-10-CM | POA: Diagnosis not present

## 2016-09-08 DIAGNOSIS — R627 Adult failure to thrive: Secondary | ICD-10-CM | POA: Diagnosis not present

## 2016-09-08 DIAGNOSIS — Z8551 Personal history of malignant neoplasm of bladder: Secondary | ICD-10-CM | POA: Diagnosis not present

## 2016-09-09 DIAGNOSIS — I4891 Unspecified atrial fibrillation: Secondary | ICD-10-CM | POA: Diagnosis not present

## 2016-09-09 DIAGNOSIS — E119 Type 2 diabetes mellitus without complications: Secondary | ICD-10-CM | POA: Diagnosis not present

## 2016-09-09 DIAGNOSIS — G309 Alzheimer's disease, unspecified: Secondary | ICD-10-CM | POA: Diagnosis not present

## 2016-09-09 DIAGNOSIS — E46 Unspecified protein-calorie malnutrition: Secondary | ICD-10-CM | POA: Diagnosis not present

## 2016-09-09 DIAGNOSIS — J449 Chronic obstructive pulmonary disease, unspecified: Secondary | ICD-10-CM | POA: Diagnosis not present

## 2016-09-09 DIAGNOSIS — I10 Essential (primary) hypertension: Secondary | ICD-10-CM | POA: Diagnosis not present

## 2016-09-09 DIAGNOSIS — R627 Adult failure to thrive: Secondary | ICD-10-CM | POA: Diagnosis not present

## 2016-09-09 DIAGNOSIS — Z8551 Personal history of malignant neoplasm of bladder: Secondary | ICD-10-CM | POA: Diagnosis not present

## 2016-09-09 DIAGNOSIS — K219 Gastro-esophageal reflux disease without esophagitis: Secondary | ICD-10-CM | POA: Diagnosis not present

## 2016-09-09 DIAGNOSIS — Z85118 Personal history of other malignant neoplasm of bronchus and lung: Secondary | ICD-10-CM | POA: Diagnosis not present

## 2016-09-09 DIAGNOSIS — E039 Hypothyroidism, unspecified: Secondary | ICD-10-CM | POA: Diagnosis not present

## 2016-09-09 DIAGNOSIS — D649 Anemia, unspecified: Secondary | ICD-10-CM | POA: Diagnosis not present

## 2016-09-10 DIAGNOSIS — E119 Type 2 diabetes mellitus without complications: Secondary | ICD-10-CM | POA: Diagnosis not present

## 2016-09-10 DIAGNOSIS — E46 Unspecified protein-calorie malnutrition: Secondary | ICD-10-CM | POA: Diagnosis not present

## 2016-09-10 DIAGNOSIS — Z8551 Personal history of malignant neoplasm of bladder: Secondary | ICD-10-CM | POA: Diagnosis not present

## 2016-09-10 DIAGNOSIS — K219 Gastro-esophageal reflux disease without esophagitis: Secondary | ICD-10-CM | POA: Diagnosis not present

## 2016-09-10 DIAGNOSIS — G309 Alzheimer's disease, unspecified: Secondary | ICD-10-CM | POA: Diagnosis not present

## 2016-09-10 DIAGNOSIS — D649 Anemia, unspecified: Secondary | ICD-10-CM | POA: Diagnosis not present

## 2016-09-10 DIAGNOSIS — J449 Chronic obstructive pulmonary disease, unspecified: Secondary | ICD-10-CM | POA: Diagnosis not present

## 2016-09-10 DIAGNOSIS — R627 Adult failure to thrive: Secondary | ICD-10-CM | POA: Diagnosis not present

## 2016-09-10 DIAGNOSIS — I4891 Unspecified atrial fibrillation: Secondary | ICD-10-CM | POA: Diagnosis not present

## 2016-09-10 DIAGNOSIS — I10 Essential (primary) hypertension: Secondary | ICD-10-CM | POA: Diagnosis not present

## 2016-09-10 DIAGNOSIS — Z85118 Personal history of other malignant neoplasm of bronchus and lung: Secondary | ICD-10-CM | POA: Diagnosis not present

## 2016-09-10 DIAGNOSIS — E039 Hypothyroidism, unspecified: Secondary | ICD-10-CM | POA: Diagnosis not present

## 2016-09-11 DIAGNOSIS — Z8551 Personal history of malignant neoplasm of bladder: Secondary | ICD-10-CM | POA: Diagnosis not present

## 2016-09-11 DIAGNOSIS — K219 Gastro-esophageal reflux disease without esophagitis: Secondary | ICD-10-CM | POA: Diagnosis not present

## 2016-09-11 DIAGNOSIS — E119 Type 2 diabetes mellitus without complications: Secondary | ICD-10-CM | POA: Diagnosis not present

## 2016-09-11 DIAGNOSIS — I10 Essential (primary) hypertension: Secondary | ICD-10-CM | POA: Diagnosis not present

## 2016-09-11 DIAGNOSIS — R627 Adult failure to thrive: Secondary | ICD-10-CM | POA: Diagnosis not present

## 2016-09-11 DIAGNOSIS — G309 Alzheimer's disease, unspecified: Secondary | ICD-10-CM | POA: Diagnosis not present

## 2016-09-11 DIAGNOSIS — E46 Unspecified protein-calorie malnutrition: Secondary | ICD-10-CM | POA: Diagnosis not present

## 2016-09-11 DIAGNOSIS — J449 Chronic obstructive pulmonary disease, unspecified: Secondary | ICD-10-CM | POA: Diagnosis not present

## 2016-09-11 DIAGNOSIS — Z85118 Personal history of other malignant neoplasm of bronchus and lung: Secondary | ICD-10-CM | POA: Diagnosis not present

## 2016-09-11 DIAGNOSIS — I4891 Unspecified atrial fibrillation: Secondary | ICD-10-CM | POA: Diagnosis not present

## 2016-09-11 DIAGNOSIS — D649 Anemia, unspecified: Secondary | ICD-10-CM | POA: Diagnosis not present

## 2016-09-11 DIAGNOSIS — E039 Hypothyroidism, unspecified: Secondary | ICD-10-CM | POA: Diagnosis not present

## 2016-09-12 DIAGNOSIS — D649 Anemia, unspecified: Secondary | ICD-10-CM | POA: Diagnosis not present

## 2016-09-12 DIAGNOSIS — Z85118 Personal history of other malignant neoplasm of bronchus and lung: Secondary | ICD-10-CM | POA: Diagnosis not present

## 2016-09-12 DIAGNOSIS — K219 Gastro-esophageal reflux disease without esophagitis: Secondary | ICD-10-CM | POA: Diagnosis not present

## 2016-09-12 DIAGNOSIS — E46 Unspecified protein-calorie malnutrition: Secondary | ICD-10-CM | POA: Diagnosis not present

## 2016-09-12 DIAGNOSIS — E039 Hypothyroidism, unspecified: Secondary | ICD-10-CM | POA: Diagnosis not present

## 2016-09-12 DIAGNOSIS — J449 Chronic obstructive pulmonary disease, unspecified: Secondary | ICD-10-CM | POA: Diagnosis not present

## 2016-09-12 DIAGNOSIS — G309 Alzheimer's disease, unspecified: Secondary | ICD-10-CM | POA: Diagnosis not present

## 2016-09-12 DIAGNOSIS — Z8551 Personal history of malignant neoplasm of bladder: Secondary | ICD-10-CM | POA: Diagnosis not present

## 2016-09-12 DIAGNOSIS — I4891 Unspecified atrial fibrillation: Secondary | ICD-10-CM | POA: Diagnosis not present

## 2016-09-12 DIAGNOSIS — E119 Type 2 diabetes mellitus without complications: Secondary | ICD-10-CM | POA: Diagnosis not present

## 2016-09-12 DIAGNOSIS — R627 Adult failure to thrive: Secondary | ICD-10-CM | POA: Diagnosis not present

## 2016-09-12 DIAGNOSIS — I10 Essential (primary) hypertension: Secondary | ICD-10-CM | POA: Diagnosis not present

## 2016-09-13 DIAGNOSIS — I10 Essential (primary) hypertension: Secondary | ICD-10-CM | POA: Diagnosis not present

## 2016-09-13 DIAGNOSIS — K219 Gastro-esophageal reflux disease without esophagitis: Secondary | ICD-10-CM | POA: Diagnosis not present

## 2016-09-13 DIAGNOSIS — R627 Adult failure to thrive: Secondary | ICD-10-CM | POA: Diagnosis not present

## 2016-09-13 DIAGNOSIS — J449 Chronic obstructive pulmonary disease, unspecified: Secondary | ICD-10-CM | POA: Diagnosis not present

## 2016-09-13 DIAGNOSIS — E46 Unspecified protein-calorie malnutrition: Secondary | ICD-10-CM | POA: Diagnosis not present

## 2016-09-13 DIAGNOSIS — E039 Hypothyroidism, unspecified: Secondary | ICD-10-CM | POA: Diagnosis not present

## 2016-09-13 DIAGNOSIS — G309 Alzheimer's disease, unspecified: Secondary | ICD-10-CM | POA: Diagnosis not present

## 2016-09-13 DIAGNOSIS — Z85118 Personal history of other malignant neoplasm of bronchus and lung: Secondary | ICD-10-CM | POA: Diagnosis not present

## 2016-09-13 DIAGNOSIS — Z8551 Personal history of malignant neoplasm of bladder: Secondary | ICD-10-CM | POA: Diagnosis not present

## 2016-09-13 DIAGNOSIS — E119 Type 2 diabetes mellitus without complications: Secondary | ICD-10-CM | POA: Diagnosis not present

## 2016-09-13 DIAGNOSIS — I4891 Unspecified atrial fibrillation: Secondary | ICD-10-CM | POA: Diagnosis not present

## 2016-09-13 DIAGNOSIS — D649 Anemia, unspecified: Secondary | ICD-10-CM | POA: Diagnosis not present

## 2016-09-14 DIAGNOSIS — E46 Unspecified protein-calorie malnutrition: Secondary | ICD-10-CM | POA: Diagnosis not present

## 2016-09-14 DIAGNOSIS — J449 Chronic obstructive pulmonary disease, unspecified: Secondary | ICD-10-CM | POA: Diagnosis not present

## 2016-09-14 DIAGNOSIS — I4891 Unspecified atrial fibrillation: Secondary | ICD-10-CM | POA: Diagnosis not present

## 2016-09-14 DIAGNOSIS — D649 Anemia, unspecified: Secondary | ICD-10-CM | POA: Diagnosis not present

## 2016-09-14 DIAGNOSIS — R627 Adult failure to thrive: Secondary | ICD-10-CM | POA: Diagnosis not present

## 2016-09-14 DIAGNOSIS — Z85118 Personal history of other malignant neoplasm of bronchus and lung: Secondary | ICD-10-CM | POA: Diagnosis not present

## 2016-09-14 DIAGNOSIS — K219 Gastro-esophageal reflux disease without esophagitis: Secondary | ICD-10-CM | POA: Diagnosis not present

## 2016-09-14 DIAGNOSIS — Z8551 Personal history of malignant neoplasm of bladder: Secondary | ICD-10-CM | POA: Diagnosis not present

## 2016-09-14 DIAGNOSIS — E039 Hypothyroidism, unspecified: Secondary | ICD-10-CM | POA: Diagnosis not present

## 2016-09-14 DIAGNOSIS — I10 Essential (primary) hypertension: Secondary | ICD-10-CM | POA: Diagnosis not present

## 2016-09-14 DIAGNOSIS — E119 Type 2 diabetes mellitus without complications: Secondary | ICD-10-CM | POA: Diagnosis not present

## 2016-09-14 DIAGNOSIS — G309 Alzheimer's disease, unspecified: Secondary | ICD-10-CM | POA: Diagnosis not present

## 2016-09-15 DIAGNOSIS — E46 Unspecified protein-calorie malnutrition: Secondary | ICD-10-CM | POA: Diagnosis not present

## 2016-09-15 DIAGNOSIS — D649 Anemia, unspecified: Secondary | ICD-10-CM | POA: Diagnosis not present

## 2016-09-15 DIAGNOSIS — K219 Gastro-esophageal reflux disease without esophagitis: Secondary | ICD-10-CM | POA: Diagnosis not present

## 2016-09-15 DIAGNOSIS — J449 Chronic obstructive pulmonary disease, unspecified: Secondary | ICD-10-CM | POA: Diagnosis not present

## 2016-09-15 DIAGNOSIS — R627 Adult failure to thrive: Secondary | ICD-10-CM | POA: Diagnosis not present

## 2016-09-15 DIAGNOSIS — G309 Alzheimer's disease, unspecified: Secondary | ICD-10-CM | POA: Diagnosis not present

## 2016-09-15 DIAGNOSIS — E039 Hypothyroidism, unspecified: Secondary | ICD-10-CM | POA: Diagnosis not present

## 2016-09-15 DIAGNOSIS — Z85118 Personal history of other malignant neoplasm of bronchus and lung: Secondary | ICD-10-CM | POA: Diagnosis not present

## 2016-09-15 DIAGNOSIS — I4891 Unspecified atrial fibrillation: Secondary | ICD-10-CM | POA: Diagnosis not present

## 2016-09-15 DIAGNOSIS — E119 Type 2 diabetes mellitus without complications: Secondary | ICD-10-CM | POA: Diagnosis not present

## 2016-09-15 DIAGNOSIS — I10 Essential (primary) hypertension: Secondary | ICD-10-CM | POA: Diagnosis not present

## 2016-09-15 DIAGNOSIS — Z8551 Personal history of malignant neoplasm of bladder: Secondary | ICD-10-CM | POA: Diagnosis not present

## 2016-09-16 DIAGNOSIS — I4891 Unspecified atrial fibrillation: Secondary | ICD-10-CM | POA: Diagnosis not present

## 2016-09-16 DIAGNOSIS — Z8551 Personal history of malignant neoplasm of bladder: Secondary | ICD-10-CM | POA: Diagnosis not present

## 2016-09-16 DIAGNOSIS — E46 Unspecified protein-calorie malnutrition: Secondary | ICD-10-CM | POA: Diagnosis not present

## 2016-09-16 DIAGNOSIS — J449 Chronic obstructive pulmonary disease, unspecified: Secondary | ICD-10-CM | POA: Diagnosis not present

## 2016-09-16 DIAGNOSIS — D649 Anemia, unspecified: Secondary | ICD-10-CM | POA: Diagnosis not present

## 2016-09-16 DIAGNOSIS — E119 Type 2 diabetes mellitus without complications: Secondary | ICD-10-CM | POA: Diagnosis not present

## 2016-09-16 DIAGNOSIS — K219 Gastro-esophageal reflux disease without esophagitis: Secondary | ICD-10-CM | POA: Diagnosis not present

## 2016-09-16 DIAGNOSIS — R627 Adult failure to thrive: Secondary | ICD-10-CM | POA: Diagnosis not present

## 2016-09-16 DIAGNOSIS — Z85118 Personal history of other malignant neoplasm of bronchus and lung: Secondary | ICD-10-CM | POA: Diagnosis not present

## 2016-09-16 DIAGNOSIS — G309 Alzheimer's disease, unspecified: Secondary | ICD-10-CM | POA: Diagnosis not present

## 2016-09-16 DIAGNOSIS — E039 Hypothyroidism, unspecified: Secondary | ICD-10-CM | POA: Diagnosis not present

## 2016-09-16 DIAGNOSIS — I10 Essential (primary) hypertension: Secondary | ICD-10-CM | POA: Diagnosis not present

## 2016-09-17 DIAGNOSIS — E46 Unspecified protein-calorie malnutrition: Secondary | ICD-10-CM | POA: Diagnosis not present

## 2016-09-17 DIAGNOSIS — R627 Adult failure to thrive: Secondary | ICD-10-CM | POA: Diagnosis not present

## 2016-09-17 DIAGNOSIS — G309 Alzheimer's disease, unspecified: Secondary | ICD-10-CM | POA: Diagnosis not present

## 2016-09-17 DIAGNOSIS — Z8551 Personal history of malignant neoplasm of bladder: Secondary | ICD-10-CM | POA: Diagnosis not present

## 2016-09-17 DIAGNOSIS — I4891 Unspecified atrial fibrillation: Secondary | ICD-10-CM | POA: Diagnosis not present

## 2016-09-17 DIAGNOSIS — J449 Chronic obstructive pulmonary disease, unspecified: Secondary | ICD-10-CM | POA: Diagnosis not present

## 2016-09-17 DIAGNOSIS — E119 Type 2 diabetes mellitus without complications: Secondary | ICD-10-CM | POA: Diagnosis not present

## 2016-09-17 DIAGNOSIS — E039 Hypothyroidism, unspecified: Secondary | ICD-10-CM | POA: Diagnosis not present

## 2016-09-17 DIAGNOSIS — D649 Anemia, unspecified: Secondary | ICD-10-CM | POA: Diagnosis not present

## 2016-09-17 DIAGNOSIS — K219 Gastro-esophageal reflux disease without esophagitis: Secondary | ICD-10-CM | POA: Diagnosis not present

## 2016-09-17 DIAGNOSIS — Z85118 Personal history of other malignant neoplasm of bronchus and lung: Secondary | ICD-10-CM | POA: Diagnosis not present

## 2016-09-17 DIAGNOSIS — I10 Essential (primary) hypertension: Secondary | ICD-10-CM | POA: Diagnosis not present

## 2016-09-18 DIAGNOSIS — E46 Unspecified protein-calorie malnutrition: Secondary | ICD-10-CM | POA: Diagnosis not present

## 2016-09-18 DIAGNOSIS — E039 Hypothyroidism, unspecified: Secondary | ICD-10-CM | POA: Diagnosis not present

## 2016-09-18 DIAGNOSIS — I10 Essential (primary) hypertension: Secondary | ICD-10-CM | POA: Diagnosis not present

## 2016-09-18 DIAGNOSIS — Z8551 Personal history of malignant neoplasm of bladder: Secondary | ICD-10-CM | POA: Diagnosis not present

## 2016-09-18 DIAGNOSIS — K219 Gastro-esophageal reflux disease without esophagitis: Secondary | ICD-10-CM | POA: Diagnosis not present

## 2016-09-18 DIAGNOSIS — I4891 Unspecified atrial fibrillation: Secondary | ICD-10-CM | POA: Diagnosis not present

## 2016-09-18 DIAGNOSIS — D649 Anemia, unspecified: Secondary | ICD-10-CM | POA: Diagnosis not present

## 2016-09-18 DIAGNOSIS — E119 Type 2 diabetes mellitus without complications: Secondary | ICD-10-CM | POA: Diagnosis not present

## 2016-09-18 DIAGNOSIS — Z85118 Personal history of other malignant neoplasm of bronchus and lung: Secondary | ICD-10-CM | POA: Diagnosis not present

## 2016-09-18 DIAGNOSIS — R627 Adult failure to thrive: Secondary | ICD-10-CM | POA: Diagnosis not present

## 2016-09-18 DIAGNOSIS — J449 Chronic obstructive pulmonary disease, unspecified: Secondary | ICD-10-CM | POA: Diagnosis not present

## 2016-09-18 DIAGNOSIS — G309 Alzheimer's disease, unspecified: Secondary | ICD-10-CM | POA: Diagnosis not present

## 2016-09-19 ENCOUNTER — Non-Acute Institutional Stay (SKILLED_NURSING_FACILITY): Payer: Medicare Other | Admitting: Gerontology

## 2016-09-19 ENCOUNTER — Encounter: Payer: Self-pay | Admitting: Gerontology

## 2016-09-19 DIAGNOSIS — Z9181 History of falling: Secondary | ICD-10-CM | POA: Diagnosis not present

## 2016-09-19 DIAGNOSIS — I10 Essential (primary) hypertension: Secondary | ICD-10-CM | POA: Diagnosis not present

## 2016-09-19 DIAGNOSIS — E46 Unspecified protein-calorie malnutrition: Secondary | ICD-10-CM | POA: Diagnosis not present

## 2016-09-19 DIAGNOSIS — D649 Anemia, unspecified: Secondary | ICD-10-CM

## 2016-09-19 DIAGNOSIS — E119 Type 2 diabetes mellitus without complications: Secondary | ICD-10-CM

## 2016-09-19 DIAGNOSIS — K5900 Constipation, unspecified: Secondary | ICD-10-CM

## 2016-09-19 DIAGNOSIS — T148XXA Other injury of unspecified body region, initial encounter: Secondary | ICD-10-CM

## 2016-09-19 DIAGNOSIS — E7800 Pure hypercholesterolemia, unspecified: Secondary | ICD-10-CM

## 2016-09-19 DIAGNOSIS — J449 Chronic obstructive pulmonary disease, unspecified: Secondary | ICD-10-CM | POA: Diagnosis not present

## 2016-09-19 DIAGNOSIS — E039 Hypothyroidism, unspecified: Secondary | ICD-10-CM

## 2016-09-19 DIAGNOSIS — R41841 Cognitive communication deficit: Secondary | ICD-10-CM

## 2016-09-19 DIAGNOSIS — M1611 Unilateral primary osteoarthritis, right hip: Secondary | ICD-10-CM

## 2016-09-19 DIAGNOSIS — J309 Allergic rhinitis, unspecified: Secondary | ICD-10-CM

## 2016-09-19 DIAGNOSIS — G301 Alzheimer's disease with late onset: Secondary | ICD-10-CM

## 2016-09-19 DIAGNOSIS — G2581 Restless legs syndrome: Secondary | ICD-10-CM

## 2016-09-19 DIAGNOSIS — I4891 Unspecified atrial fibrillation: Secondary | ICD-10-CM | POA: Diagnosis not present

## 2016-09-19 DIAGNOSIS — R1312 Dysphagia, oropharyngeal phase: Secondary | ICD-10-CM

## 2016-09-19 DIAGNOSIS — F0280 Dementia in other diseases classified elsewhere without behavioral disturbance: Secondary | ICD-10-CM

## 2016-09-19 DIAGNOSIS — Z85118 Personal history of other malignant neoplasm of bronchus and lung: Secondary | ICD-10-CM | POA: Diagnosis not present

## 2016-09-19 DIAGNOSIS — K219 Gastro-esophageal reflux disease without esophagitis: Secondary | ICD-10-CM

## 2016-09-19 DIAGNOSIS — Z8551 Personal history of malignant neoplasm of bladder: Secondary | ICD-10-CM | POA: Diagnosis not present

## 2016-09-19 DIAGNOSIS — G4700 Insomnia, unspecified: Secondary | ICD-10-CM

## 2016-09-19 DIAGNOSIS — F29 Unspecified psychosis not due to a substance or known physiological condition: Secondary | ICD-10-CM

## 2016-09-19 DIAGNOSIS — R609 Edema, unspecified: Secondary | ICD-10-CM

## 2016-09-19 DIAGNOSIS — R627 Adult failure to thrive: Secondary | ICD-10-CM | POA: Diagnosis not present

## 2016-09-19 DIAGNOSIS — G309 Alzheimer's disease, unspecified: Secondary | ICD-10-CM | POA: Diagnosis not present

## 2016-09-19 DIAGNOSIS — N289 Disorder of kidney and ureter, unspecified: Secondary | ICD-10-CM

## 2016-09-19 DIAGNOSIS — D510 Vitamin B12 deficiency anemia due to intrinsic factor deficiency: Secondary | ICD-10-CM

## 2016-09-19 NOTE — Progress Notes (Signed)
Location:   [facility] Room Number: [place] of Service:  SNF [phone]) Provider:  Toni Arthurs, NP-C  Kirk Ruths, MD  Patient Care Team: Kirk Ruths, MD as PCP - General (Internal Medicine) Einar Pheasant, MD (Internal Medicine)  Extended Emergency Contact Information Primary Emergency Contact: Duwayne Heck Address: [address]          [place], [place] [address] [name] of [place] Phone: [phone] Relation: None Secondary Emergency Contact: [name] Address: [address]          [place], [place] [address] Home Phone: [phone] Relation: None  Code Status:  DNR Goals of care: Advanced Directive information Advanced Directives 09/19/2016  Does Patient Have a Medical Advance Directive? Yes  Type of Advance Directive Out of facility DNR (pink MOST or yellow form)  Does patient want to make changes to medical advance directive? No - Patient declined  Copy of [place] in Chart? -     Chief Complaint  Patient presents with  . Medical Management of Chronic Issues    Routine Visit    HPI:  Pt is a 83 y.o. female seen today for medical management of chronic diseases. Patient has been stable over the past month. She continues to have a very slow decline in cognition and mobility. She spends the majority of her day in a recliner making her pot holders. Patient is very slow to speak-responded now. However, she always has a big smile on her face when spoken to. She tries to communicate, but her words are mostly unintelligible now. When asked directly, patient denies pain or any other negative symptoms. Patient reports she is doing well. No falls this past month. Pt is being followed by Hospice for symptom management and monitoring. Vital signs stable. No other complaints.   Past Medical History:  Diagnosis Date  . Anemia   . Atrial fibrillation ([place])    post op, converted to NSR wtih amiodarone  .  Bladder cancer ([place]) 2003   s/p resection and chemotherapy, followed by Dr Jacqlyn Larsen  . COPD (chronic obstructive pulmonary disease) ([place])   . Dementia   . Diabetes mellitus ([place])   . Diabetes mellitus without complication ([place])   . GERD (gastroesophageal reflux disease)   . Hypercholesterolemia   . Hyperlipemia   . Hypertension   . Hypothyroidism   . Inner ear disease   . Lung cancer ([place])    squamous cell s/p left upper lobe llingulectomy 12/08/04 - Dr Arlyce Dice   Past Surgical History:  Procedure Laterality Date  . BLADDER SURGERY  2003   for bladder cancer - Dr Jacqlyn Larsen  . CHOLECYSTECTOMY  1986  . LUNG CANCER SURGERY  12/08/04   s/p left upper lobe lingulectomy - Dr Arlyce Dice    Allergies  Allergen Reactions  . Lovenox [Enoxaparin Sodium]   . Novolog [Insulin Aspart]     Allergies as of 09/19/2016      Reactions   Lovenox [enoxaparin Sodium]    Novolog [insulin Aspart]       Medication List       Accurate as of 09/19/16  9:51 AM. Always use your most recent med list.          acetaminophen 650 MG CR tablet Commonly known as:  TYLENOL Take 650 mg by mouth 3 (three) times daily. Do not exceed 3000 mg in 24 hr   bisacodyl 10 MG suppository Commonly known as:  DULCOLAX Place 10 mg  rectally daily as needed for mild constipation or moderate constipation. *If Milk of Magnesia does not resolve constipation.*   gabapentin 100 MG capsule Commonly known as:  NEURONTIN Take 100 mg by mouth daily. 9 am   gabapentin 300 MG capsule Commonly known as:  NEURONTIN Take 300 mg by mouth at bedtime.   glimepiride 2 MG tablet Commonly known as:  AMARYL Take 3 mg by mouth 2 (two) times daily. 1 and 1/2 tablet = 3 mg   HYDROcodone-acetaminophen 5-325 MG tablet Commonly known as:  NORCO/VICODIN Take 1 tablet by mouth every 4 (four) hours as needed. Do not exceed 3000 mg of tylenol in 24 hours. Consider all sources.   ipratropium-albuterol 0.5-2.5 (3) MG/3ML Soln Commonly known as:   DUONEB Take 3 mLs by nebulization 3 (three) times daily.   ipratropium-albuterol 0.5-2.5 (3) MG/3ML Soln Commonly known as:  DUONEB Take 3 mLs by nebulization every 6 (six) hours as needed.   Lidocaine 4 % Ptch Apply 1 patch topically daily. Apply at 9 am to area on lower back daily. Remove after 12 hours   losartan 50 MG tablet Commonly known as:  COZAAR Take 50 mg by mouth daily.   MAG-AL PLUS XS 161-096-04 MG/5ML suspension Generic drug:  alum & mag hydroxide-simeth Take 15 mLs by mouth 4 (four) times daily. Give with or right after meals and at bedtime   magnesium hydroxide 400 MG/5ML suspension Commonly known as:  MILK OF MAGNESIA Take 30 mLs by mouth every 4 (four) hours as needed for mild constipation. If Constipation/ no BM for 2 days.  *no results in 24 hours may administer bisacodyl suppository*   Melatonin 3 MG Tabs Take 6 mg by mouth at bedtime. 2 tabs   metoprolol succinate 25 MG 24 hr tablet Commonly known as:  TOPROL-XL Take 25 mg by mouth daily.   mirtazapine 7.5 MG tablet Commonly known as:  REMERON Take 7.5 mg by mouth at bedtime.   montelukast 10 MG tablet Commonly known as:  SINGULAIR Take 10 mg by mouth at bedtime.   NUTRITIONAL SUPPLEMENTS PO Give Magic cup by mouth 2 times daily with lunch and dinner trays for nutritional support. Pt prefers Vanilla.   nystatin powder Commonly known as:  MYCOSTATIN/NYSTOP Apply thin film of powder topically underneath each breast every shift until healed for redness/itching.   omeprazole 20 MG capsule Commonly known as:  PRILOSEC Take 20 mg by mouth 2 (two) times daily.   polyethylene glycol packet Commonly known as:  MIRALAX / GLYCOLAX Take 17 g by mouth daily.   rOPINIRole 0.25 MG tablet Commonly known as:  REQUIP Take 1 tablet (0.25 mg total) by mouth at bedtime.   sennosides-docusate sodium 8.6-50 MG tablet Commonly known as:  SENOKOT-S Take 1 tablet by mouth 2 (two) times daily.   torsemide 5 MG  tablet Commonly known as:  DEMADEX Take 5 mg by mouth daily.   trolamine salicylate 10 % cream Commonly known as:  ASPERCREME Apply 1 application topically 3 (three) times daily as needed for muscle pain. Apply and massage on the right hip/outer thigh.       Review of Systems  Unable to perform ROS: Dementia  Constitutional: Negative.   HENT: Negative for congestion, postnasal drip, rhinorrhea, sinus pressure, trouble swallowing and voice change.   Eyes: Negative.   Respiratory: Negative for cough, choking, chest tightness, shortness of breath and wheezing.   Cardiovascular: Negative for chest pain and leg swelling.  Gastrointestinal: Negative.   Genitourinary:  Negative.   Musculoskeletal: Negative.   Skin: Positive for color change.  Neurological: Positive for speech difficulty.  Psychiatric/Behavioral: Positive for confusion.  All other systems reviewed and are negative.   Immunization History  Administered Date(s) Administered  . Influenza Split 12/05/2011  . Influenza-Unspecified 10/21/2013, 10/08/2014, 10/08/2015  . PPD Test 11/05/2015   Pertinent  Health Maintenance Due  Topic Date Due  . FOOT EXAM  12/05/1943  . OPHTHALMOLOGY EXAM  12/05/1943  . DEXA SCAN  12/05/2000  . PNA vac Low Risk Adult (1 of 2 - PCV13) 12/05/2000  . INFLUENZA VACCINE  08/22/2016  . HEMOGLOBIN A1C  11/20/2016   No flowsheet data found. Functional Status Survey:    Vitals:   09/19/16 0920  BP: (!) 123/56  Pulse: 85  Resp: 20  Temp: 97.6 F (36.4 C)  SpO2: 96%  Weight: 122 lb 3.2 oz (55.4 kg)  Height: 5\' 5"  (1.651 m)   Body mass index is 20.34 kg/m. Physical Exam  Constitutional: She is oriented to person, place, and time. She appears well-developed and well-nourished. No distress.  HENT:  Head: Normocephalic and atraumatic.  Mouth/Throat: Oropharynx is clear and moist.  Eyes: Pupils are equal, round, and reactive to light. Conjunctivae and EOM are normal.  Neck: Normal  range of motion. Neck supple. No JVD present.  Cardiovascular: Normal rate, regular rhythm, normal heart sounds and intact distal pulses.  Exam reveals no gallop and no friction rub.   No murmur heard. Pulses:      Dorsalis pedis pulses are 2+ on the right side, and 2+ on the left side.  Pulmonary/Chest: Effort normal and breath sounds normal. No respiratory distress. She has no decreased breath sounds. She has no wheezes. She has no rhonchi. She has no rales. She exhibits no tenderness.  Abdominal: Soft. Bowel sounds are normal. She exhibits no distension. There is no tenderness. There is no rebound and no guarding.  Lymphadenopathy:    She has no cervical adenopathy.  Neurological: She is alert and oriented to person, place, and time.  Skin: Skin is warm and dry. No rash noted. She is not diaphoretic. No pallor.  Psychiatric: She has a normal mood and affect. Her behavior is normal. Judgment and thought content normal. Her speech is delayed. Cognition and memory are impaired. She exhibits abnormal recent memory.  Nursing note and vitals reviewed.   Labs reviewed:  Recent Labs  10/21/15 1850 05/21/16 1300 07/13/16 0710  NA 141 142 140  K 4.0 3.7 3.9  CL 107 105 106  CO2 27 25 26   GLUCOSE 101* 158* 128*  BUN 22* 23* 25*  CREATININE 0.93 0.96 0.80  CALCIUM 9.4 9.1 9.0  MG 1.5* 1.7  --     Recent Labs  10/21/15 1850 05/21/16 1300 07/13/16 0710  AST 22 25 15   ALT 18 18 14   ALKPHOS 44 47 46  BILITOT 0.6 0.5 0.6  PROT 6.9 6.7 6.4*  ALBUMIN 4.0 3.9 3.6    Recent Labs  10/21/15 1850 05/21/16 1300 07/13/16 0710  WBC 10.4 8.9 9.2  NEUTROABS 5.3 5.7 4.7  HGB 14.3 14.3 13.6  HCT 42.9 44.2 40.4  MCV 90.6 91.2 89.1  PLT 294 292 308   Lab Results  Component Value Date   TSH 1.359 05/21/2016   Lab Results  Component Value Date   HGBA1C 6.8 (H) 05/21/2016   Lab Results  Component Value Date   CHOL 170 10/21/2015   HDL 40 (L) 10/21/2015   LDLCALC  92 10/21/2015    TRIG 188 (H) 10/21/2015   CHOLHDL 4.3 10/21/2015    Significant Diagnostic Results in last 30 days:  No results found.  Assessment/Plan 1. Late onset Alzheimer's disease without behavioral disturbance 2. Dementia associated with other underlying disease without behavioral disturbance  Stable  Hospice services  3. Hx of falling  Stable  4. Anemia, unspecified type  Stable  5. Essential (primary) hypertension  Stable  Continue losartan 50 mg by mouth daily  Continue metoprolol ER 25 mg tablet by mouth daily  6. Chronic obstructive pulmonary disease, unspecified COPD type (HCC)  Stable  Continue Advair HFA 115-28mcg 2 puffs every 12 hours for COPD  Continue Singulair 10 mg one tablet by mouth daily at bedtime  Continue Spiriva 18 g 1 capsule inhaled once daily   7. Type 2 diabetes mellitus without complication, without long-term current use of insulin (HCC)  Stable  Continue glimepiride 2 mg tablets 1-1/2 tabs by mouth twice a day  Continue metformin 500 mg by mouth twice a day  8. Gastroesophageal reflux disease without esophagitis  Stable  Continue omeprazole 20 mg by mouth daily  9. Pure hypercholesterolemia  Stable  10. Hypothyroidism, unspecified type  Stable  Monitoring  11. Constipation, unspecified constipation type  Stable  Milk of magnesia 30 mL by mouth every 4 hours when necessary constipation  Bisacodyl suppository 10 mg per rectum daily when necessary  12. Allergic rhinitis, unspecified seasonality, unspecified trigger  Stable  Continue cetirizine 5 mg by mouth daily at bedtime  13. Unsp psychosis not due to a substance or known physiol cond  Stable  14. Dysphagia, oropharyngeal phase  Stable  15. Vitamin B12 deficiency anemia due to intrinsic factor deficiency  Stable  Continue folic acid 716 g by mouth daily  Continue cyanocobalamin 250 g by mouth daily  16. Osteoarthritis of right hip, unspecified  osteoarthritis type  Stable  Continue Tylenol 650 mg by mouth 3 times a day  Continue cholecalciferol 1000 units by mouth daily  Continue Os-Cal 500+ D31 tab by mouth twice a day  Continue gabapentin 300 mg by mouth daily at bedtime for pain  Continue gabapentin 100 mg by mouth daily pain  Continue hydrocodone-acetaminophen 5-3 25 mg 1 tablet by mouth every 4 hours when necessary pain  17. Cognitive communication deficit  Stable  18. Insomnia, unspecified type  Stable  Melatonin 3 mg tablets 2 tablets by mouth daily at bedtime  19. Edema, unspecified type  Stable   Continue torsemide 5 mg tablet by mouth daily  20. Hypomagnesemia  Stable  Attending magnesium oxide 250 mg tablet 1 tablet by mouth daily  21. Renal insufficiency  Stable  Renally adjust medications as appropriate  22. RLS (restless legs syndrome)  Stable  Continue ropinirole 0.25 mg by mouth daily at bedtime  23. Friction injury to skin  Skin prep to B- Great toes BID  Skin Prep to B-heels BID   Family/ staff Communication:   Total Time:   Documentation:   Face to Face:   Family/Phone:    Labs/tests ordered:    Medication list reviewed and assessed for continued appropriateness. Monthly medication orders reviewed and signed.  Vikki Ports, NP-C Geriatrics [facility] [phone], [place] [address] Cell Phone (Mon-Fri 8am-5pm):  [phone] On Call:  [phone] & follow prompts after 5pm & weekends Office Phone:  [phone] Office Fax:  [phone]

## 2016-09-20 DIAGNOSIS — I4891 Unspecified atrial fibrillation: Secondary | ICD-10-CM | POA: Diagnosis not present

## 2016-09-20 DIAGNOSIS — R627 Adult failure to thrive: Secondary | ICD-10-CM | POA: Diagnosis not present

## 2016-09-20 DIAGNOSIS — E119 Type 2 diabetes mellitus without complications: Secondary | ICD-10-CM | POA: Diagnosis not present

## 2016-09-20 DIAGNOSIS — Z8551 Personal history of malignant neoplasm of bladder: Secondary | ICD-10-CM | POA: Diagnosis not present

## 2016-09-20 DIAGNOSIS — K219 Gastro-esophageal reflux disease without esophagitis: Secondary | ICD-10-CM | POA: Diagnosis not present

## 2016-09-20 DIAGNOSIS — Z85118 Personal history of other malignant neoplasm of bronchus and lung: Secondary | ICD-10-CM | POA: Diagnosis not present

## 2016-09-20 DIAGNOSIS — I10 Essential (primary) hypertension: Secondary | ICD-10-CM | POA: Diagnosis not present

## 2016-09-20 DIAGNOSIS — E46 Unspecified protein-calorie malnutrition: Secondary | ICD-10-CM | POA: Diagnosis not present

## 2016-09-20 DIAGNOSIS — G309 Alzheimer's disease, unspecified: Secondary | ICD-10-CM | POA: Diagnosis not present

## 2016-09-20 DIAGNOSIS — E039 Hypothyroidism, unspecified: Secondary | ICD-10-CM | POA: Diagnosis not present

## 2016-09-20 DIAGNOSIS — J449 Chronic obstructive pulmonary disease, unspecified: Secondary | ICD-10-CM | POA: Diagnosis not present

## 2016-09-20 DIAGNOSIS — D649 Anemia, unspecified: Secondary | ICD-10-CM | POA: Diagnosis not present

## 2016-09-21 DIAGNOSIS — E46 Unspecified protein-calorie malnutrition: Secondary | ICD-10-CM | POA: Diagnosis not present

## 2016-09-21 DIAGNOSIS — G309 Alzheimer's disease, unspecified: Secondary | ICD-10-CM | POA: Diagnosis not present

## 2016-09-21 DIAGNOSIS — I4891 Unspecified atrial fibrillation: Secondary | ICD-10-CM | POA: Diagnosis not present

## 2016-09-21 DIAGNOSIS — E119 Type 2 diabetes mellitus without complications: Secondary | ICD-10-CM | POA: Diagnosis not present

## 2016-09-21 DIAGNOSIS — E039 Hypothyroidism, unspecified: Secondary | ICD-10-CM | POA: Diagnosis not present

## 2016-09-21 DIAGNOSIS — R627 Adult failure to thrive: Secondary | ICD-10-CM | POA: Diagnosis not present

## 2016-09-21 DIAGNOSIS — Z85118 Personal history of other malignant neoplasm of bronchus and lung: Secondary | ICD-10-CM | POA: Diagnosis not present

## 2016-09-21 DIAGNOSIS — K219 Gastro-esophageal reflux disease without esophagitis: Secondary | ICD-10-CM | POA: Diagnosis not present

## 2016-09-21 DIAGNOSIS — Z8551 Personal history of malignant neoplasm of bladder: Secondary | ICD-10-CM | POA: Diagnosis not present

## 2016-09-21 DIAGNOSIS — D649 Anemia, unspecified: Secondary | ICD-10-CM | POA: Diagnosis not present

## 2016-09-21 DIAGNOSIS — J449 Chronic obstructive pulmonary disease, unspecified: Secondary | ICD-10-CM | POA: Diagnosis not present

## 2016-09-21 DIAGNOSIS — I10 Essential (primary) hypertension: Secondary | ICD-10-CM | POA: Diagnosis not present

## 2016-09-22 ENCOUNTER — Encounter
Admission: RE | Admit: 2016-09-22 | Discharge: 2016-09-22 | Disposition: A | Discharge status: Home / Self Care | Payer: Medicare Other | Source: Ambulatory Visit | Attending: Internal Medicine | Admitting: Internal Medicine

## 2016-09-22 DIAGNOSIS — E039 Hypothyroidism, unspecified: Secondary | ICD-10-CM | POA: Diagnosis not present

## 2016-09-22 DIAGNOSIS — D649 Anemia, unspecified: Secondary | ICD-10-CM | POA: Diagnosis not present

## 2016-09-22 DIAGNOSIS — E46 Unspecified protein-calorie malnutrition: Secondary | ICD-10-CM | POA: Diagnosis not present

## 2016-09-22 DIAGNOSIS — J449 Chronic obstructive pulmonary disease, unspecified: Secondary | ICD-10-CM | POA: Diagnosis not present

## 2016-09-22 DIAGNOSIS — I4891 Unspecified atrial fibrillation: Secondary | ICD-10-CM | POA: Diagnosis not present

## 2016-09-22 DIAGNOSIS — R627 Adult failure to thrive: Secondary | ICD-10-CM | POA: Diagnosis not present

## 2016-09-22 DIAGNOSIS — E119 Type 2 diabetes mellitus without complications: Secondary | ICD-10-CM | POA: Diagnosis not present

## 2016-09-22 DIAGNOSIS — Z85118 Personal history of other malignant neoplasm of bronchus and lung: Secondary | ICD-10-CM | POA: Diagnosis not present

## 2016-09-22 DIAGNOSIS — Z8551 Personal history of malignant neoplasm of bladder: Secondary | ICD-10-CM | POA: Diagnosis not present

## 2016-09-22 DIAGNOSIS — G309 Alzheimer's disease, unspecified: Secondary | ICD-10-CM | POA: Diagnosis not present

## 2016-09-22 DIAGNOSIS — K219 Gastro-esophageal reflux disease without esophagitis: Secondary | ICD-10-CM | POA: Diagnosis not present

## 2016-09-22 DIAGNOSIS — I10 Essential (primary) hypertension: Secondary | ICD-10-CM | POA: Diagnosis not present

## 2016-09-23 DIAGNOSIS — E119 Type 2 diabetes mellitus without complications: Secondary | ICD-10-CM | POA: Diagnosis not present

## 2016-09-23 DIAGNOSIS — I10 Essential (primary) hypertension: Secondary | ICD-10-CM | POA: Diagnosis not present

## 2016-09-23 DIAGNOSIS — R627 Adult failure to thrive: Secondary | ICD-10-CM | POA: Diagnosis not present

## 2016-09-23 DIAGNOSIS — K219 Gastro-esophageal reflux disease without esophagitis: Secondary | ICD-10-CM | POA: Diagnosis not present

## 2016-09-23 DIAGNOSIS — E039 Hypothyroidism, unspecified: Secondary | ICD-10-CM | POA: Diagnosis not present

## 2016-09-23 DIAGNOSIS — Z85118 Personal history of other malignant neoplasm of bronchus and lung: Secondary | ICD-10-CM | POA: Diagnosis not present

## 2016-09-23 DIAGNOSIS — I4891 Unspecified atrial fibrillation: Secondary | ICD-10-CM | POA: Diagnosis not present

## 2016-09-23 DIAGNOSIS — E46 Unspecified protein-calorie malnutrition: Secondary | ICD-10-CM | POA: Diagnosis not present

## 2016-09-23 DIAGNOSIS — D649 Anemia, unspecified: Secondary | ICD-10-CM | POA: Diagnosis not present

## 2016-09-23 DIAGNOSIS — J449 Chronic obstructive pulmonary disease, unspecified: Secondary | ICD-10-CM | POA: Diagnosis not present

## 2016-09-23 DIAGNOSIS — Z8551 Personal history of malignant neoplasm of bladder: Secondary | ICD-10-CM | POA: Diagnosis not present

## 2016-09-23 DIAGNOSIS — G309 Alzheimer's disease, unspecified: Secondary | ICD-10-CM | POA: Diagnosis not present

## 2016-09-24 DIAGNOSIS — E119 Type 2 diabetes mellitus without complications: Secondary | ICD-10-CM | POA: Diagnosis not present

## 2016-09-24 DIAGNOSIS — Z85118 Personal history of other malignant neoplasm of bronchus and lung: Secondary | ICD-10-CM | POA: Diagnosis not present

## 2016-09-24 DIAGNOSIS — D649 Anemia, unspecified: Secondary | ICD-10-CM | POA: Diagnosis not present

## 2016-09-24 DIAGNOSIS — I10 Essential (primary) hypertension: Secondary | ICD-10-CM | POA: Diagnosis not present

## 2016-09-24 DIAGNOSIS — R627 Adult failure to thrive: Secondary | ICD-10-CM | POA: Diagnosis not present

## 2016-09-24 DIAGNOSIS — E46 Unspecified protein-calorie malnutrition: Secondary | ICD-10-CM | POA: Diagnosis not present

## 2016-09-24 DIAGNOSIS — K219 Gastro-esophageal reflux disease without esophagitis: Secondary | ICD-10-CM | POA: Diagnosis not present

## 2016-09-24 DIAGNOSIS — E039 Hypothyroidism, unspecified: Secondary | ICD-10-CM | POA: Diagnosis not present

## 2016-09-24 DIAGNOSIS — I4891 Unspecified atrial fibrillation: Secondary | ICD-10-CM | POA: Diagnosis not present

## 2016-09-24 DIAGNOSIS — G309 Alzheimer's disease, unspecified: Secondary | ICD-10-CM | POA: Diagnosis not present

## 2016-09-24 DIAGNOSIS — J449 Chronic obstructive pulmonary disease, unspecified: Secondary | ICD-10-CM | POA: Diagnosis not present

## 2016-09-24 DIAGNOSIS — Z8551 Personal history of malignant neoplasm of bladder: Secondary | ICD-10-CM | POA: Diagnosis not present

## 2016-09-25 DIAGNOSIS — I10 Essential (primary) hypertension: Secondary | ICD-10-CM | POA: Diagnosis not present

## 2016-09-25 DIAGNOSIS — G309 Alzheimer's disease, unspecified: Secondary | ICD-10-CM | POA: Diagnosis not present

## 2016-09-25 DIAGNOSIS — E039 Hypothyroidism, unspecified: Secondary | ICD-10-CM | POA: Diagnosis not present

## 2016-09-25 DIAGNOSIS — K219 Gastro-esophageal reflux disease without esophagitis: Secondary | ICD-10-CM | POA: Diagnosis not present

## 2016-09-25 DIAGNOSIS — Z85118 Personal history of other malignant neoplasm of bronchus and lung: Secondary | ICD-10-CM | POA: Diagnosis not present

## 2016-09-25 DIAGNOSIS — R627 Adult failure to thrive: Secondary | ICD-10-CM | POA: Diagnosis not present

## 2016-09-25 DIAGNOSIS — Z8551 Personal history of malignant neoplasm of bladder: Secondary | ICD-10-CM | POA: Diagnosis not present

## 2016-09-25 DIAGNOSIS — J449 Chronic obstructive pulmonary disease, unspecified: Secondary | ICD-10-CM | POA: Diagnosis not present

## 2016-09-25 DIAGNOSIS — E46 Unspecified protein-calorie malnutrition: Secondary | ICD-10-CM | POA: Diagnosis not present

## 2016-09-25 DIAGNOSIS — E119 Type 2 diabetes mellitus without complications: Secondary | ICD-10-CM | POA: Diagnosis not present

## 2016-09-25 DIAGNOSIS — I4891 Unspecified atrial fibrillation: Secondary | ICD-10-CM | POA: Diagnosis not present

## 2016-09-25 DIAGNOSIS — D649 Anemia, unspecified: Secondary | ICD-10-CM | POA: Diagnosis not present

## 2016-09-26 DIAGNOSIS — I4891 Unspecified atrial fibrillation: Secondary | ICD-10-CM | POA: Diagnosis not present

## 2016-09-26 DIAGNOSIS — D649 Anemia, unspecified: Secondary | ICD-10-CM | POA: Diagnosis not present

## 2016-09-26 DIAGNOSIS — G309 Alzheimer's disease, unspecified: Secondary | ICD-10-CM | POA: Diagnosis not present

## 2016-09-26 DIAGNOSIS — R627 Adult failure to thrive: Secondary | ICD-10-CM | POA: Diagnosis not present

## 2016-09-26 DIAGNOSIS — E46 Unspecified protein-calorie malnutrition: Secondary | ICD-10-CM | POA: Diagnosis not present

## 2016-09-26 DIAGNOSIS — J449 Chronic obstructive pulmonary disease, unspecified: Secondary | ICD-10-CM | POA: Diagnosis not present

## 2016-09-26 DIAGNOSIS — Z85118 Personal history of other malignant neoplasm of bronchus and lung: Secondary | ICD-10-CM | POA: Diagnosis not present

## 2016-09-26 DIAGNOSIS — Z8551 Personal history of malignant neoplasm of bladder: Secondary | ICD-10-CM | POA: Diagnosis not present

## 2016-09-26 DIAGNOSIS — E039 Hypothyroidism, unspecified: Secondary | ICD-10-CM | POA: Diagnosis not present

## 2016-09-26 DIAGNOSIS — I10 Essential (primary) hypertension: Secondary | ICD-10-CM | POA: Diagnosis not present

## 2016-09-26 DIAGNOSIS — E119 Type 2 diabetes mellitus without complications: Secondary | ICD-10-CM | POA: Diagnosis not present

## 2016-09-26 DIAGNOSIS — K219 Gastro-esophageal reflux disease without esophagitis: Secondary | ICD-10-CM | POA: Diagnosis not present

## 2016-09-27 DIAGNOSIS — E119 Type 2 diabetes mellitus without complications: Secondary | ICD-10-CM | POA: Diagnosis not present

## 2016-09-27 DIAGNOSIS — K219 Gastro-esophageal reflux disease without esophagitis: Secondary | ICD-10-CM | POA: Diagnosis not present

## 2016-09-27 DIAGNOSIS — Z85118 Personal history of other malignant neoplasm of bronchus and lung: Secondary | ICD-10-CM | POA: Diagnosis not present

## 2016-09-27 DIAGNOSIS — E46 Unspecified protein-calorie malnutrition: Secondary | ICD-10-CM | POA: Diagnosis not present

## 2016-09-27 DIAGNOSIS — I4891 Unspecified atrial fibrillation: Secondary | ICD-10-CM | POA: Diagnosis not present

## 2016-09-27 DIAGNOSIS — R627 Adult failure to thrive: Secondary | ICD-10-CM | POA: Diagnosis not present

## 2016-09-27 DIAGNOSIS — E039 Hypothyroidism, unspecified: Secondary | ICD-10-CM | POA: Diagnosis not present

## 2016-09-27 DIAGNOSIS — I10 Essential (primary) hypertension: Secondary | ICD-10-CM | POA: Diagnosis not present

## 2016-09-27 DIAGNOSIS — Z8551 Personal history of malignant neoplasm of bladder: Secondary | ICD-10-CM | POA: Diagnosis not present

## 2016-09-27 DIAGNOSIS — G309 Alzheimer's disease, unspecified: Secondary | ICD-10-CM | POA: Diagnosis not present

## 2016-09-27 DIAGNOSIS — D649 Anemia, unspecified: Secondary | ICD-10-CM | POA: Diagnosis not present

## 2016-09-27 DIAGNOSIS — J449 Chronic obstructive pulmonary disease, unspecified: Secondary | ICD-10-CM | POA: Diagnosis not present

## 2016-09-28 DIAGNOSIS — E119 Type 2 diabetes mellitus without complications: Secondary | ICD-10-CM | POA: Diagnosis not present

## 2016-09-28 DIAGNOSIS — K219 Gastro-esophageal reflux disease without esophagitis: Secondary | ICD-10-CM | POA: Diagnosis not present

## 2016-09-28 DIAGNOSIS — R627 Adult failure to thrive: Secondary | ICD-10-CM | POA: Diagnosis not present

## 2016-09-28 DIAGNOSIS — E039 Hypothyroidism, unspecified: Secondary | ICD-10-CM | POA: Diagnosis not present

## 2016-09-28 DIAGNOSIS — D649 Anemia, unspecified: Secondary | ICD-10-CM | POA: Diagnosis not present

## 2016-09-28 DIAGNOSIS — G309 Alzheimer's disease, unspecified: Secondary | ICD-10-CM | POA: Diagnosis not present

## 2016-09-28 DIAGNOSIS — I4891 Unspecified atrial fibrillation: Secondary | ICD-10-CM | POA: Diagnosis not present

## 2016-09-28 DIAGNOSIS — E46 Unspecified protein-calorie malnutrition: Secondary | ICD-10-CM | POA: Diagnosis not present

## 2016-09-28 DIAGNOSIS — Z8551 Personal history of malignant neoplasm of bladder: Secondary | ICD-10-CM | POA: Diagnosis not present

## 2016-09-28 DIAGNOSIS — I10 Essential (primary) hypertension: Secondary | ICD-10-CM | POA: Diagnosis not present

## 2016-09-28 DIAGNOSIS — Z85118 Personal history of other malignant neoplasm of bronchus and lung: Secondary | ICD-10-CM | POA: Diagnosis not present

## 2016-09-28 DIAGNOSIS — J449 Chronic obstructive pulmonary disease, unspecified: Secondary | ICD-10-CM | POA: Diagnosis not present

## 2016-09-29 DIAGNOSIS — I10 Essential (primary) hypertension: Secondary | ICD-10-CM | POA: Diagnosis not present

## 2016-09-29 DIAGNOSIS — D649 Anemia, unspecified: Secondary | ICD-10-CM | POA: Diagnosis not present

## 2016-09-29 DIAGNOSIS — E46 Unspecified protein-calorie malnutrition: Secondary | ICD-10-CM | POA: Diagnosis not present

## 2016-09-29 DIAGNOSIS — E039 Hypothyroidism, unspecified: Secondary | ICD-10-CM | POA: Diagnosis not present

## 2016-09-29 DIAGNOSIS — Z85118 Personal history of other malignant neoplasm of bronchus and lung: Secondary | ICD-10-CM | POA: Diagnosis not present

## 2016-09-29 DIAGNOSIS — R627 Adult failure to thrive: Secondary | ICD-10-CM | POA: Diagnosis not present

## 2016-09-29 DIAGNOSIS — I4891 Unspecified atrial fibrillation: Secondary | ICD-10-CM | POA: Diagnosis not present

## 2016-09-29 DIAGNOSIS — Z8551 Personal history of malignant neoplasm of bladder: Secondary | ICD-10-CM | POA: Diagnosis not present

## 2016-09-29 DIAGNOSIS — K219 Gastro-esophageal reflux disease without esophagitis: Secondary | ICD-10-CM | POA: Diagnosis not present

## 2016-09-29 DIAGNOSIS — G309 Alzheimer's disease, unspecified: Secondary | ICD-10-CM | POA: Diagnosis not present

## 2016-09-29 DIAGNOSIS — J449 Chronic obstructive pulmonary disease, unspecified: Secondary | ICD-10-CM | POA: Diagnosis not present

## 2016-09-29 DIAGNOSIS — E119 Type 2 diabetes mellitus without complications: Secondary | ICD-10-CM | POA: Diagnosis not present

## 2016-09-30 DIAGNOSIS — E46 Unspecified protein-calorie malnutrition: Secondary | ICD-10-CM | POA: Diagnosis not present

## 2016-09-30 DIAGNOSIS — G309 Alzheimer's disease, unspecified: Secondary | ICD-10-CM | POA: Diagnosis not present

## 2016-09-30 DIAGNOSIS — E119 Type 2 diabetes mellitus without complications: Secondary | ICD-10-CM | POA: Diagnosis not present

## 2016-09-30 DIAGNOSIS — J449 Chronic obstructive pulmonary disease, unspecified: Secondary | ICD-10-CM | POA: Diagnosis not present

## 2016-09-30 DIAGNOSIS — R627 Adult failure to thrive: Secondary | ICD-10-CM | POA: Diagnosis not present

## 2016-09-30 DIAGNOSIS — I4891 Unspecified atrial fibrillation: Secondary | ICD-10-CM | POA: Diagnosis not present

## 2016-09-30 DIAGNOSIS — E039 Hypothyroidism, unspecified: Secondary | ICD-10-CM | POA: Diagnosis not present

## 2016-09-30 DIAGNOSIS — D649 Anemia, unspecified: Secondary | ICD-10-CM | POA: Diagnosis not present

## 2016-09-30 DIAGNOSIS — I10 Essential (primary) hypertension: Secondary | ICD-10-CM | POA: Diagnosis not present

## 2016-09-30 DIAGNOSIS — Z8551 Personal history of malignant neoplasm of bladder: Secondary | ICD-10-CM | POA: Diagnosis not present

## 2016-09-30 DIAGNOSIS — Z85118 Personal history of other malignant neoplasm of bronchus and lung: Secondary | ICD-10-CM | POA: Diagnosis not present

## 2016-09-30 DIAGNOSIS — K219 Gastro-esophageal reflux disease without esophagitis: Secondary | ICD-10-CM | POA: Diagnosis not present

## 2016-10-01 DIAGNOSIS — D649 Anemia, unspecified: Secondary | ICD-10-CM | POA: Diagnosis not present

## 2016-10-01 DIAGNOSIS — E46 Unspecified protein-calorie malnutrition: Secondary | ICD-10-CM | POA: Diagnosis not present

## 2016-10-01 DIAGNOSIS — Z85118 Personal history of other malignant neoplasm of bronchus and lung: Secondary | ICD-10-CM | POA: Diagnosis not present

## 2016-10-01 DIAGNOSIS — I4891 Unspecified atrial fibrillation: Secondary | ICD-10-CM | POA: Diagnosis not present

## 2016-10-01 DIAGNOSIS — R627 Adult failure to thrive: Secondary | ICD-10-CM | POA: Diagnosis not present

## 2016-10-01 DIAGNOSIS — E119 Type 2 diabetes mellitus without complications: Secondary | ICD-10-CM | POA: Diagnosis not present

## 2016-10-01 DIAGNOSIS — J449 Chronic obstructive pulmonary disease, unspecified: Secondary | ICD-10-CM | POA: Diagnosis not present

## 2016-10-01 DIAGNOSIS — G309 Alzheimer's disease, unspecified: Secondary | ICD-10-CM | POA: Diagnosis not present

## 2016-10-01 DIAGNOSIS — I10 Essential (primary) hypertension: Secondary | ICD-10-CM | POA: Diagnosis not present

## 2016-10-01 DIAGNOSIS — Z8551 Personal history of malignant neoplasm of bladder: Secondary | ICD-10-CM | POA: Diagnosis not present

## 2016-10-01 DIAGNOSIS — E039 Hypothyroidism, unspecified: Secondary | ICD-10-CM | POA: Diagnosis not present

## 2016-10-01 DIAGNOSIS — K219 Gastro-esophageal reflux disease without esophagitis: Secondary | ICD-10-CM | POA: Diagnosis not present

## 2016-10-02 DIAGNOSIS — K219 Gastro-esophageal reflux disease without esophagitis: Secondary | ICD-10-CM | POA: Diagnosis not present

## 2016-10-02 DIAGNOSIS — Z8551 Personal history of malignant neoplasm of bladder: Secondary | ICD-10-CM | POA: Diagnosis not present

## 2016-10-02 DIAGNOSIS — E039 Hypothyroidism, unspecified: Secondary | ICD-10-CM | POA: Diagnosis not present

## 2016-10-02 DIAGNOSIS — D649 Anemia, unspecified: Secondary | ICD-10-CM | POA: Diagnosis not present

## 2016-10-02 DIAGNOSIS — E46 Unspecified protein-calorie malnutrition: Secondary | ICD-10-CM | POA: Diagnosis not present

## 2016-10-02 DIAGNOSIS — Z85118 Personal history of other malignant neoplasm of bronchus and lung: Secondary | ICD-10-CM | POA: Diagnosis not present

## 2016-10-02 DIAGNOSIS — I10 Essential (primary) hypertension: Secondary | ICD-10-CM | POA: Diagnosis not present

## 2016-10-02 DIAGNOSIS — I4891 Unspecified atrial fibrillation: Secondary | ICD-10-CM | POA: Diagnosis not present

## 2016-10-02 DIAGNOSIS — E119 Type 2 diabetes mellitus without complications: Secondary | ICD-10-CM | POA: Diagnosis not present

## 2016-10-02 DIAGNOSIS — J449 Chronic obstructive pulmonary disease, unspecified: Secondary | ICD-10-CM | POA: Diagnosis not present

## 2016-10-02 DIAGNOSIS — R627 Adult failure to thrive: Secondary | ICD-10-CM | POA: Diagnosis not present

## 2016-10-02 DIAGNOSIS — G309 Alzheimer's disease, unspecified: Secondary | ICD-10-CM | POA: Diagnosis not present

## 2016-10-03 DIAGNOSIS — K219 Gastro-esophageal reflux disease without esophagitis: Secondary | ICD-10-CM | POA: Diagnosis not present

## 2016-10-03 DIAGNOSIS — E46 Unspecified protein-calorie malnutrition: Secondary | ICD-10-CM | POA: Diagnosis not present

## 2016-10-03 DIAGNOSIS — D649 Anemia, unspecified: Secondary | ICD-10-CM | POA: Diagnosis not present

## 2016-10-03 DIAGNOSIS — I4891 Unspecified atrial fibrillation: Secondary | ICD-10-CM | POA: Diagnosis not present

## 2016-10-03 DIAGNOSIS — Z85118 Personal history of other malignant neoplasm of bronchus and lung: Secondary | ICD-10-CM | POA: Diagnosis not present

## 2016-10-03 DIAGNOSIS — E119 Type 2 diabetes mellitus without complications: Secondary | ICD-10-CM | POA: Diagnosis not present

## 2016-10-03 DIAGNOSIS — Z8551 Personal history of malignant neoplasm of bladder: Secondary | ICD-10-CM | POA: Diagnosis not present

## 2016-10-03 DIAGNOSIS — R627 Adult failure to thrive: Secondary | ICD-10-CM | POA: Diagnosis not present

## 2016-10-03 DIAGNOSIS — G309 Alzheimer's disease, unspecified: Secondary | ICD-10-CM | POA: Diagnosis not present

## 2016-10-03 DIAGNOSIS — J449 Chronic obstructive pulmonary disease, unspecified: Secondary | ICD-10-CM | POA: Diagnosis not present

## 2016-10-03 DIAGNOSIS — I10 Essential (primary) hypertension: Secondary | ICD-10-CM | POA: Diagnosis not present

## 2016-10-03 DIAGNOSIS — E039 Hypothyroidism, unspecified: Secondary | ICD-10-CM | POA: Diagnosis not present

## 2016-10-04 DIAGNOSIS — I10 Essential (primary) hypertension: Secondary | ICD-10-CM | POA: Diagnosis not present

## 2016-10-04 DIAGNOSIS — Z8551 Personal history of malignant neoplasm of bladder: Secondary | ICD-10-CM | POA: Diagnosis not present

## 2016-10-04 DIAGNOSIS — D649 Anemia, unspecified: Secondary | ICD-10-CM | POA: Diagnosis not present

## 2016-10-04 DIAGNOSIS — J449 Chronic obstructive pulmonary disease, unspecified: Secondary | ICD-10-CM | POA: Diagnosis not present

## 2016-10-04 DIAGNOSIS — K219 Gastro-esophageal reflux disease without esophagitis: Secondary | ICD-10-CM | POA: Diagnosis not present

## 2016-10-04 DIAGNOSIS — Z85118 Personal history of other malignant neoplasm of bronchus and lung: Secondary | ICD-10-CM | POA: Diagnosis not present

## 2016-10-04 DIAGNOSIS — G309 Alzheimer's disease, unspecified: Secondary | ICD-10-CM | POA: Diagnosis not present

## 2016-10-04 DIAGNOSIS — E119 Type 2 diabetes mellitus without complications: Secondary | ICD-10-CM | POA: Diagnosis not present

## 2016-10-04 DIAGNOSIS — R627 Adult failure to thrive: Secondary | ICD-10-CM | POA: Diagnosis not present

## 2016-10-04 DIAGNOSIS — E46 Unspecified protein-calorie malnutrition: Secondary | ICD-10-CM | POA: Diagnosis not present

## 2016-10-04 DIAGNOSIS — E039 Hypothyroidism, unspecified: Secondary | ICD-10-CM | POA: Diagnosis not present

## 2016-10-04 DIAGNOSIS — I4891 Unspecified atrial fibrillation: Secondary | ICD-10-CM | POA: Diagnosis not present

## 2016-10-05 DIAGNOSIS — R627 Adult failure to thrive: Secondary | ICD-10-CM | POA: Diagnosis not present

## 2016-10-05 DIAGNOSIS — E46 Unspecified protein-calorie malnutrition: Secondary | ICD-10-CM | POA: Diagnosis not present

## 2016-10-05 DIAGNOSIS — E119 Type 2 diabetes mellitus without complications: Secondary | ICD-10-CM | POA: Diagnosis not present

## 2016-10-05 DIAGNOSIS — D649 Anemia, unspecified: Secondary | ICD-10-CM | POA: Diagnosis not present

## 2016-10-05 DIAGNOSIS — E039 Hypothyroidism, unspecified: Secondary | ICD-10-CM | POA: Diagnosis not present

## 2016-10-05 DIAGNOSIS — I10 Essential (primary) hypertension: Secondary | ICD-10-CM | POA: Diagnosis not present

## 2016-10-05 DIAGNOSIS — Z8551 Personal history of malignant neoplasm of bladder: Secondary | ICD-10-CM | POA: Diagnosis not present

## 2016-10-05 DIAGNOSIS — Z85118 Personal history of other malignant neoplasm of bronchus and lung: Secondary | ICD-10-CM | POA: Diagnosis not present

## 2016-10-05 DIAGNOSIS — K219 Gastro-esophageal reflux disease without esophagitis: Secondary | ICD-10-CM | POA: Diagnosis not present

## 2016-10-05 DIAGNOSIS — G309 Alzheimer's disease, unspecified: Secondary | ICD-10-CM | POA: Diagnosis not present

## 2016-10-05 DIAGNOSIS — I4891 Unspecified atrial fibrillation: Secondary | ICD-10-CM | POA: Diagnosis not present

## 2016-10-05 DIAGNOSIS — J449 Chronic obstructive pulmonary disease, unspecified: Secondary | ICD-10-CM | POA: Diagnosis not present

## 2016-10-06 DIAGNOSIS — I10 Essential (primary) hypertension: Secondary | ICD-10-CM | POA: Diagnosis not present

## 2016-10-06 DIAGNOSIS — Z8551 Personal history of malignant neoplasm of bladder: Secondary | ICD-10-CM | POA: Diagnosis not present

## 2016-10-06 DIAGNOSIS — E46 Unspecified protein-calorie malnutrition: Secondary | ICD-10-CM | POA: Diagnosis not present

## 2016-10-06 DIAGNOSIS — Z85118 Personal history of other malignant neoplasm of bronchus and lung: Secondary | ICD-10-CM | POA: Diagnosis not present

## 2016-10-06 DIAGNOSIS — E039 Hypothyroidism, unspecified: Secondary | ICD-10-CM | POA: Diagnosis not present

## 2016-10-06 DIAGNOSIS — R627 Adult failure to thrive: Secondary | ICD-10-CM | POA: Diagnosis not present

## 2016-10-06 DIAGNOSIS — K219 Gastro-esophageal reflux disease without esophagitis: Secondary | ICD-10-CM | POA: Diagnosis not present

## 2016-10-06 DIAGNOSIS — J449 Chronic obstructive pulmonary disease, unspecified: Secondary | ICD-10-CM | POA: Diagnosis not present

## 2016-10-06 DIAGNOSIS — G309 Alzheimer's disease, unspecified: Secondary | ICD-10-CM | POA: Diagnosis not present

## 2016-10-06 DIAGNOSIS — E119 Type 2 diabetes mellitus without complications: Secondary | ICD-10-CM | POA: Diagnosis not present

## 2016-10-06 DIAGNOSIS — I4891 Unspecified atrial fibrillation: Secondary | ICD-10-CM | POA: Diagnosis not present

## 2016-10-06 DIAGNOSIS — D649 Anemia, unspecified: Secondary | ICD-10-CM | POA: Diagnosis not present

## 2016-10-07 DIAGNOSIS — I10 Essential (primary) hypertension: Secondary | ICD-10-CM | POA: Diagnosis not present

## 2016-10-07 DIAGNOSIS — E46 Unspecified protein-calorie malnutrition: Secondary | ICD-10-CM | POA: Diagnosis not present

## 2016-10-07 DIAGNOSIS — E039 Hypothyroidism, unspecified: Secondary | ICD-10-CM | POA: Diagnosis not present

## 2016-10-07 DIAGNOSIS — I4891 Unspecified atrial fibrillation: Secondary | ICD-10-CM | POA: Diagnosis not present

## 2016-10-07 DIAGNOSIS — Z85118 Personal history of other malignant neoplasm of bronchus and lung: Secondary | ICD-10-CM | POA: Diagnosis not present

## 2016-10-07 DIAGNOSIS — D649 Anemia, unspecified: Secondary | ICD-10-CM | POA: Diagnosis not present

## 2016-10-07 DIAGNOSIS — G309 Alzheimer's disease, unspecified: Secondary | ICD-10-CM | POA: Diagnosis not present

## 2016-10-07 DIAGNOSIS — E119 Type 2 diabetes mellitus without complications: Secondary | ICD-10-CM | POA: Diagnosis not present

## 2016-10-07 DIAGNOSIS — Z8551 Personal history of malignant neoplasm of bladder: Secondary | ICD-10-CM | POA: Diagnosis not present

## 2016-10-07 DIAGNOSIS — J449 Chronic obstructive pulmonary disease, unspecified: Secondary | ICD-10-CM | POA: Diagnosis not present

## 2016-10-07 DIAGNOSIS — R627 Adult failure to thrive: Secondary | ICD-10-CM | POA: Diagnosis not present

## 2016-10-07 DIAGNOSIS — K219 Gastro-esophageal reflux disease without esophagitis: Secondary | ICD-10-CM | POA: Diagnosis not present

## 2016-10-08 DIAGNOSIS — G309 Alzheimer's disease, unspecified: Secondary | ICD-10-CM | POA: Diagnosis not present

## 2016-10-08 DIAGNOSIS — K219 Gastro-esophageal reflux disease without esophagitis: Secondary | ICD-10-CM | POA: Diagnosis not present

## 2016-10-08 DIAGNOSIS — Z8551 Personal history of malignant neoplasm of bladder: Secondary | ICD-10-CM | POA: Diagnosis not present

## 2016-10-08 DIAGNOSIS — J449 Chronic obstructive pulmonary disease, unspecified: Secondary | ICD-10-CM | POA: Diagnosis not present

## 2016-10-08 DIAGNOSIS — E119 Type 2 diabetes mellitus without complications: Secondary | ICD-10-CM | POA: Diagnosis not present

## 2016-10-08 DIAGNOSIS — E039 Hypothyroidism, unspecified: Secondary | ICD-10-CM | POA: Diagnosis not present

## 2016-10-08 DIAGNOSIS — I4891 Unspecified atrial fibrillation: Secondary | ICD-10-CM | POA: Diagnosis not present

## 2016-10-08 DIAGNOSIS — R627 Adult failure to thrive: Secondary | ICD-10-CM | POA: Diagnosis not present

## 2016-10-08 DIAGNOSIS — I10 Essential (primary) hypertension: Secondary | ICD-10-CM | POA: Diagnosis not present

## 2016-10-08 DIAGNOSIS — D649 Anemia, unspecified: Secondary | ICD-10-CM | POA: Diagnosis not present

## 2016-10-08 DIAGNOSIS — E46 Unspecified protein-calorie malnutrition: Secondary | ICD-10-CM | POA: Diagnosis not present

## 2016-10-08 DIAGNOSIS — Z85118 Personal history of other malignant neoplasm of bronchus and lung: Secondary | ICD-10-CM | POA: Diagnosis not present

## 2016-10-09 DIAGNOSIS — R627 Adult failure to thrive: Secondary | ICD-10-CM | POA: Diagnosis not present

## 2016-10-09 DIAGNOSIS — E46 Unspecified protein-calorie malnutrition: Secondary | ICD-10-CM | POA: Diagnosis not present

## 2016-10-09 DIAGNOSIS — D649 Anemia, unspecified: Secondary | ICD-10-CM | POA: Diagnosis not present

## 2016-10-09 DIAGNOSIS — I10 Essential (primary) hypertension: Secondary | ICD-10-CM | POA: Diagnosis not present

## 2016-10-09 DIAGNOSIS — E119 Type 2 diabetes mellitus without complications: Secondary | ICD-10-CM | POA: Diagnosis not present

## 2016-10-09 DIAGNOSIS — K219 Gastro-esophageal reflux disease without esophagitis: Secondary | ICD-10-CM | POA: Diagnosis not present

## 2016-10-09 DIAGNOSIS — Z8551 Personal history of malignant neoplasm of bladder: Secondary | ICD-10-CM | POA: Diagnosis not present

## 2016-10-09 DIAGNOSIS — Z85118 Personal history of other malignant neoplasm of bronchus and lung: Secondary | ICD-10-CM | POA: Diagnosis not present

## 2016-10-09 DIAGNOSIS — I4891 Unspecified atrial fibrillation: Secondary | ICD-10-CM | POA: Diagnosis not present

## 2016-10-09 DIAGNOSIS — E039 Hypothyroidism, unspecified: Secondary | ICD-10-CM | POA: Diagnosis not present

## 2016-10-09 DIAGNOSIS — G309 Alzheimer's disease, unspecified: Secondary | ICD-10-CM | POA: Diagnosis not present

## 2016-10-09 DIAGNOSIS — J449 Chronic obstructive pulmonary disease, unspecified: Secondary | ICD-10-CM | POA: Diagnosis not present

## 2016-10-10 DIAGNOSIS — I4891 Unspecified atrial fibrillation: Secondary | ICD-10-CM | POA: Diagnosis not present

## 2016-10-10 DIAGNOSIS — E039 Hypothyroidism, unspecified: Secondary | ICD-10-CM | POA: Diagnosis not present

## 2016-10-10 DIAGNOSIS — K219 Gastro-esophageal reflux disease without esophagitis: Secondary | ICD-10-CM | POA: Diagnosis not present

## 2016-10-10 DIAGNOSIS — E46 Unspecified protein-calorie malnutrition: Secondary | ICD-10-CM | POA: Diagnosis not present

## 2016-10-10 DIAGNOSIS — Z85118 Personal history of other malignant neoplasm of bronchus and lung: Secondary | ICD-10-CM | POA: Diagnosis not present

## 2016-10-10 DIAGNOSIS — I10 Essential (primary) hypertension: Secondary | ICD-10-CM | POA: Diagnosis not present

## 2016-10-10 DIAGNOSIS — G309 Alzheimer's disease, unspecified: Secondary | ICD-10-CM | POA: Diagnosis not present

## 2016-10-10 DIAGNOSIS — E119 Type 2 diabetes mellitus without complications: Secondary | ICD-10-CM | POA: Diagnosis not present

## 2016-10-10 DIAGNOSIS — R627 Adult failure to thrive: Secondary | ICD-10-CM | POA: Diagnosis not present

## 2016-10-10 DIAGNOSIS — D649 Anemia, unspecified: Secondary | ICD-10-CM | POA: Diagnosis not present

## 2016-10-10 DIAGNOSIS — Z8551 Personal history of malignant neoplasm of bladder: Secondary | ICD-10-CM | POA: Diagnosis not present

## 2016-10-10 DIAGNOSIS — J449 Chronic obstructive pulmonary disease, unspecified: Secondary | ICD-10-CM | POA: Diagnosis not present

## 2016-10-11 DIAGNOSIS — E039 Hypothyroidism, unspecified: Secondary | ICD-10-CM | POA: Diagnosis not present

## 2016-10-11 DIAGNOSIS — K219 Gastro-esophageal reflux disease without esophagitis: Secondary | ICD-10-CM | POA: Diagnosis not present

## 2016-10-11 DIAGNOSIS — G309 Alzheimer's disease, unspecified: Secondary | ICD-10-CM | POA: Diagnosis not present

## 2016-10-11 DIAGNOSIS — D649 Anemia, unspecified: Secondary | ICD-10-CM | POA: Diagnosis not present

## 2016-10-11 DIAGNOSIS — I4891 Unspecified atrial fibrillation: Secondary | ICD-10-CM | POA: Diagnosis not present

## 2016-10-11 DIAGNOSIS — Z85118 Personal history of other malignant neoplasm of bronchus and lung: Secondary | ICD-10-CM | POA: Diagnosis not present

## 2016-10-11 DIAGNOSIS — R627 Adult failure to thrive: Secondary | ICD-10-CM | POA: Diagnosis not present

## 2016-10-11 DIAGNOSIS — Z8551 Personal history of malignant neoplasm of bladder: Secondary | ICD-10-CM | POA: Diagnosis not present

## 2016-10-11 DIAGNOSIS — J449 Chronic obstructive pulmonary disease, unspecified: Secondary | ICD-10-CM | POA: Diagnosis not present

## 2016-10-11 DIAGNOSIS — I10 Essential (primary) hypertension: Secondary | ICD-10-CM | POA: Diagnosis not present

## 2016-10-11 DIAGNOSIS — E119 Type 2 diabetes mellitus without complications: Secondary | ICD-10-CM | POA: Diagnosis not present

## 2016-10-11 DIAGNOSIS — E46 Unspecified protein-calorie malnutrition: Secondary | ICD-10-CM | POA: Diagnosis not present

## 2016-10-12 DIAGNOSIS — I10 Essential (primary) hypertension: Secondary | ICD-10-CM | POA: Diagnosis not present

## 2016-10-12 DIAGNOSIS — R627 Adult failure to thrive: Secondary | ICD-10-CM | POA: Diagnosis not present

## 2016-10-12 DIAGNOSIS — K219 Gastro-esophageal reflux disease without esophagitis: Secondary | ICD-10-CM | POA: Diagnosis not present

## 2016-10-12 DIAGNOSIS — Z85118 Personal history of other malignant neoplasm of bronchus and lung: Secondary | ICD-10-CM | POA: Diagnosis not present

## 2016-10-12 DIAGNOSIS — E119 Type 2 diabetes mellitus without complications: Secondary | ICD-10-CM | POA: Diagnosis not present

## 2016-10-12 DIAGNOSIS — J449 Chronic obstructive pulmonary disease, unspecified: Secondary | ICD-10-CM | POA: Diagnosis not present

## 2016-10-12 DIAGNOSIS — Z8551 Personal history of malignant neoplasm of bladder: Secondary | ICD-10-CM | POA: Diagnosis not present

## 2016-10-12 DIAGNOSIS — D649 Anemia, unspecified: Secondary | ICD-10-CM | POA: Diagnosis not present

## 2016-10-12 DIAGNOSIS — G309 Alzheimer's disease, unspecified: Secondary | ICD-10-CM | POA: Diagnosis not present

## 2016-10-12 DIAGNOSIS — E46 Unspecified protein-calorie malnutrition: Secondary | ICD-10-CM | POA: Diagnosis not present

## 2016-10-12 DIAGNOSIS — E039 Hypothyroidism, unspecified: Secondary | ICD-10-CM | POA: Diagnosis not present

## 2016-10-12 DIAGNOSIS — I4891 Unspecified atrial fibrillation: Secondary | ICD-10-CM | POA: Diagnosis not present

## 2016-10-13 DIAGNOSIS — E119 Type 2 diabetes mellitus without complications: Secondary | ICD-10-CM | POA: Diagnosis not present

## 2016-10-13 DIAGNOSIS — G309 Alzheimer's disease, unspecified: Secondary | ICD-10-CM | POA: Diagnosis not present

## 2016-10-13 DIAGNOSIS — Z8551 Personal history of malignant neoplasm of bladder: Secondary | ICD-10-CM | POA: Diagnosis not present

## 2016-10-13 DIAGNOSIS — J449 Chronic obstructive pulmonary disease, unspecified: Secondary | ICD-10-CM | POA: Diagnosis not present

## 2016-10-13 DIAGNOSIS — E46 Unspecified protein-calorie malnutrition: Secondary | ICD-10-CM | POA: Diagnosis not present

## 2016-10-13 DIAGNOSIS — K219 Gastro-esophageal reflux disease without esophagitis: Secondary | ICD-10-CM | POA: Diagnosis not present

## 2016-10-13 DIAGNOSIS — Z85118 Personal history of other malignant neoplasm of bronchus and lung: Secondary | ICD-10-CM | POA: Diagnosis not present

## 2016-10-13 DIAGNOSIS — R627 Adult failure to thrive: Secondary | ICD-10-CM | POA: Diagnosis not present

## 2016-10-13 DIAGNOSIS — E039 Hypothyroidism, unspecified: Secondary | ICD-10-CM | POA: Diagnosis not present

## 2016-10-13 DIAGNOSIS — D649 Anemia, unspecified: Secondary | ICD-10-CM | POA: Diagnosis not present

## 2016-10-13 DIAGNOSIS — I10 Essential (primary) hypertension: Secondary | ICD-10-CM | POA: Diagnosis not present

## 2016-10-13 DIAGNOSIS — I4891 Unspecified atrial fibrillation: Secondary | ICD-10-CM | POA: Diagnosis not present

## 2016-10-14 DIAGNOSIS — K219 Gastro-esophageal reflux disease without esophagitis: Secondary | ICD-10-CM | POA: Diagnosis not present

## 2016-10-14 DIAGNOSIS — D649 Anemia, unspecified: Secondary | ICD-10-CM | POA: Diagnosis not present

## 2016-10-14 DIAGNOSIS — E119 Type 2 diabetes mellitus without complications: Secondary | ICD-10-CM | POA: Diagnosis not present

## 2016-10-14 DIAGNOSIS — I10 Essential (primary) hypertension: Secondary | ICD-10-CM | POA: Diagnosis not present

## 2016-10-14 DIAGNOSIS — G309 Alzheimer's disease, unspecified: Secondary | ICD-10-CM | POA: Diagnosis not present

## 2016-10-14 DIAGNOSIS — I4891 Unspecified atrial fibrillation: Secondary | ICD-10-CM | POA: Diagnosis not present

## 2016-10-14 DIAGNOSIS — J449 Chronic obstructive pulmonary disease, unspecified: Secondary | ICD-10-CM | POA: Diagnosis not present

## 2016-10-14 DIAGNOSIS — E46 Unspecified protein-calorie malnutrition: Secondary | ICD-10-CM | POA: Diagnosis not present

## 2016-10-14 DIAGNOSIS — Z85118 Personal history of other malignant neoplasm of bronchus and lung: Secondary | ICD-10-CM | POA: Diagnosis not present

## 2016-10-14 DIAGNOSIS — Z8551 Personal history of malignant neoplasm of bladder: Secondary | ICD-10-CM | POA: Diagnosis not present

## 2016-10-14 DIAGNOSIS — E039 Hypothyroidism, unspecified: Secondary | ICD-10-CM | POA: Diagnosis not present

## 2016-10-14 DIAGNOSIS — R627 Adult failure to thrive: Secondary | ICD-10-CM | POA: Diagnosis not present

## 2016-10-15 DIAGNOSIS — K219 Gastro-esophageal reflux disease without esophagitis: Secondary | ICD-10-CM | POA: Diagnosis not present

## 2016-10-15 DIAGNOSIS — R627 Adult failure to thrive: Secondary | ICD-10-CM | POA: Diagnosis not present

## 2016-10-15 DIAGNOSIS — Z8551 Personal history of malignant neoplasm of bladder: Secondary | ICD-10-CM | POA: Diagnosis not present

## 2016-10-15 DIAGNOSIS — J449 Chronic obstructive pulmonary disease, unspecified: Secondary | ICD-10-CM | POA: Diagnosis not present

## 2016-10-15 DIAGNOSIS — E039 Hypothyroidism, unspecified: Secondary | ICD-10-CM | POA: Diagnosis not present

## 2016-10-15 DIAGNOSIS — E46 Unspecified protein-calorie malnutrition: Secondary | ICD-10-CM | POA: Diagnosis not present

## 2016-10-15 DIAGNOSIS — D649 Anemia, unspecified: Secondary | ICD-10-CM | POA: Diagnosis not present

## 2016-10-15 DIAGNOSIS — G309 Alzheimer's disease, unspecified: Secondary | ICD-10-CM | POA: Diagnosis not present

## 2016-10-15 DIAGNOSIS — Z85118 Personal history of other malignant neoplasm of bronchus and lung: Secondary | ICD-10-CM | POA: Diagnosis not present

## 2016-10-15 DIAGNOSIS — E119 Type 2 diabetes mellitus without complications: Secondary | ICD-10-CM | POA: Diagnosis not present

## 2016-10-15 DIAGNOSIS — I10 Essential (primary) hypertension: Secondary | ICD-10-CM | POA: Diagnosis not present

## 2016-10-15 DIAGNOSIS — I4891 Unspecified atrial fibrillation: Secondary | ICD-10-CM | POA: Diagnosis not present

## 2016-10-16 ENCOUNTER — Other Ambulatory Visit
Admission: RE | Admit: 2016-10-16 | Discharge: 2016-10-16 | Disposition: A | Discharge status: Home / Self Care | Source: Ambulatory Visit | Attending: Gerontology | Admitting: Gerontology

## 2016-10-16 DIAGNOSIS — E119 Type 2 diabetes mellitus without complications: Secondary | ICD-10-CM | POA: Diagnosis not present

## 2016-10-16 DIAGNOSIS — I10 Essential (primary) hypertension: Secondary | ICD-10-CM | POA: Diagnosis not present

## 2016-10-16 DIAGNOSIS — J449 Chronic obstructive pulmonary disease, unspecified: Secondary | ICD-10-CM | POA: Diagnosis not present

## 2016-10-16 DIAGNOSIS — D649 Anemia, unspecified: Secondary | ICD-10-CM | POA: Diagnosis not present

## 2016-10-16 DIAGNOSIS — E039 Hypothyroidism, unspecified: Secondary | ICD-10-CM | POA: Diagnosis not present

## 2016-10-16 DIAGNOSIS — G309 Alzheimer's disease, unspecified: Secondary | ICD-10-CM | POA: Diagnosis not present

## 2016-10-16 DIAGNOSIS — E46 Unspecified protein-calorie malnutrition: Secondary | ICD-10-CM | POA: Diagnosis not present

## 2016-10-16 DIAGNOSIS — R410 Disorientation, unspecified: Secondary | ICD-10-CM | POA: Diagnosis not present

## 2016-10-16 DIAGNOSIS — Z85118 Personal history of other malignant neoplasm of bronchus and lung: Secondary | ICD-10-CM | POA: Diagnosis not present

## 2016-10-16 DIAGNOSIS — Z8551 Personal history of malignant neoplasm of bladder: Secondary | ICD-10-CM | POA: Diagnosis not present

## 2016-10-16 DIAGNOSIS — K219 Gastro-esophageal reflux disease without esophagitis: Secondary | ICD-10-CM | POA: Diagnosis not present

## 2016-10-16 DIAGNOSIS — R627 Adult failure to thrive: Secondary | ICD-10-CM | POA: Diagnosis not present

## 2016-10-16 DIAGNOSIS — I4891 Unspecified atrial fibrillation: Secondary | ICD-10-CM | POA: Diagnosis not present

## 2016-10-16 LAB — URINALYSIS, COMPLETE (UACMP) WITH MICROSCOPIC
BILIRUBIN URINE: NEGATIVE
Glucose, UA: NEGATIVE mg/dL
HGB URINE DIPSTICK: NEGATIVE
KETONES UR: NEGATIVE mg/dL
NITRITE: NEGATIVE
PH: 5 (ref 5.0–8.0)
Protein, ur: NEGATIVE mg/dL
SPECIFIC GRAVITY, URINE: 1.016 (ref 1.005–1.030)

## 2016-10-17 DIAGNOSIS — E039 Hypothyroidism, unspecified: Secondary | ICD-10-CM | POA: Diagnosis not present

## 2016-10-17 DIAGNOSIS — K219 Gastro-esophageal reflux disease without esophagitis: Secondary | ICD-10-CM | POA: Diagnosis not present

## 2016-10-17 DIAGNOSIS — Z8551 Personal history of malignant neoplasm of bladder: Secondary | ICD-10-CM | POA: Diagnosis not present

## 2016-10-17 DIAGNOSIS — E46 Unspecified protein-calorie malnutrition: Secondary | ICD-10-CM | POA: Diagnosis not present

## 2016-10-17 DIAGNOSIS — J449 Chronic obstructive pulmonary disease, unspecified: Secondary | ICD-10-CM | POA: Diagnosis not present

## 2016-10-17 DIAGNOSIS — D649 Anemia, unspecified: Secondary | ICD-10-CM | POA: Diagnosis not present

## 2016-10-17 DIAGNOSIS — I10 Essential (primary) hypertension: Secondary | ICD-10-CM | POA: Diagnosis not present

## 2016-10-17 DIAGNOSIS — Z85118 Personal history of other malignant neoplasm of bronchus and lung: Secondary | ICD-10-CM | POA: Diagnosis not present

## 2016-10-17 DIAGNOSIS — G309 Alzheimer's disease, unspecified: Secondary | ICD-10-CM | POA: Diagnosis not present

## 2016-10-17 DIAGNOSIS — E119 Type 2 diabetes mellitus without complications: Secondary | ICD-10-CM | POA: Diagnosis not present

## 2016-10-17 DIAGNOSIS — I4891 Unspecified atrial fibrillation: Secondary | ICD-10-CM | POA: Diagnosis not present

## 2016-10-17 DIAGNOSIS — R627 Adult failure to thrive: Secondary | ICD-10-CM | POA: Diagnosis not present

## 2016-10-18 ENCOUNTER — Non-Acute Institutional Stay (SKILLED_NURSING_FACILITY): Payer: Medicare Other | Admitting: Gerontology

## 2016-10-18 ENCOUNTER — Encounter: Payer: Self-pay | Admitting: Gerontology

## 2016-10-18 DIAGNOSIS — G4700 Insomnia, unspecified: Secondary | ICD-10-CM

## 2016-10-18 DIAGNOSIS — F0280 Dementia in other diseases classified elsewhere without behavioral disturbance: Secondary | ICD-10-CM

## 2016-10-18 DIAGNOSIS — I10 Essential (primary) hypertension: Secondary | ICD-10-CM | POA: Diagnosis not present

## 2016-10-18 DIAGNOSIS — Z9181 History of falling: Secondary | ICD-10-CM

## 2016-10-18 DIAGNOSIS — R609 Edema, unspecified: Secondary | ICD-10-CM

## 2016-10-18 DIAGNOSIS — I4891 Unspecified atrial fibrillation: Secondary | ICD-10-CM | POA: Diagnosis not present

## 2016-10-18 DIAGNOSIS — J449 Chronic obstructive pulmonary disease, unspecified: Secondary | ICD-10-CM | POA: Diagnosis not present

## 2016-10-18 DIAGNOSIS — K219 Gastro-esophageal reflux disease without esophagitis: Secondary | ICD-10-CM | POA: Diagnosis not present

## 2016-10-18 DIAGNOSIS — D510 Vitamin B12 deficiency anemia due to intrinsic factor deficiency: Secondary | ICD-10-CM

## 2016-10-18 DIAGNOSIS — D649 Anemia, unspecified: Secondary | ICD-10-CM | POA: Diagnosis not present

## 2016-10-18 DIAGNOSIS — E46 Unspecified protein-calorie malnutrition: Secondary | ICD-10-CM | POA: Diagnosis not present

## 2016-10-18 DIAGNOSIS — G301 Alzheimer's disease with late onset: Secondary | ICD-10-CM | POA: Diagnosis not present

## 2016-10-18 DIAGNOSIS — M1611 Unilateral primary osteoarthritis, right hip: Secondary | ICD-10-CM

## 2016-10-18 DIAGNOSIS — J309 Allergic rhinitis, unspecified: Secondary | ICD-10-CM

## 2016-10-18 DIAGNOSIS — N289 Disorder of kidney and ureter, unspecified: Secondary | ICD-10-CM

## 2016-10-18 DIAGNOSIS — E7800 Pure hypercholesterolemia, unspecified: Secondary | ICD-10-CM

## 2016-10-18 DIAGNOSIS — E039 Hypothyroidism, unspecified: Secondary | ICD-10-CM | POA: Diagnosis not present

## 2016-10-18 DIAGNOSIS — R1312 Dysphagia, oropharyngeal phase: Secondary | ICD-10-CM

## 2016-10-18 DIAGNOSIS — R41841 Cognitive communication deficit: Secondary | ICD-10-CM

## 2016-10-18 DIAGNOSIS — G309 Alzheimer's disease, unspecified: Secondary | ICD-10-CM | POA: Diagnosis not present

## 2016-10-18 DIAGNOSIS — K5900 Constipation, unspecified: Secondary | ICD-10-CM

## 2016-10-18 DIAGNOSIS — F29 Unspecified psychosis not due to a substance or known physiological condition: Secondary | ICD-10-CM

## 2016-10-18 DIAGNOSIS — G2581 Restless legs syndrome: Secondary | ICD-10-CM

## 2016-10-18 DIAGNOSIS — Z8551 Personal history of malignant neoplasm of bladder: Secondary | ICD-10-CM | POA: Diagnosis not present

## 2016-10-18 DIAGNOSIS — Z85118 Personal history of other malignant neoplasm of bronchus and lung: Secondary | ICD-10-CM | POA: Diagnosis not present

## 2016-10-18 DIAGNOSIS — R627 Adult failure to thrive: Secondary | ICD-10-CM | POA: Diagnosis not present

## 2016-10-18 DIAGNOSIS — T148XXA Other injury of unspecified body region, initial encounter: Secondary | ICD-10-CM

## 2016-10-18 DIAGNOSIS — E119 Type 2 diabetes mellitus without complications: Secondary | ICD-10-CM | POA: Diagnosis not present

## 2016-10-18 LAB — URINE CULTURE: Culture: 80000 — AB

## 2016-10-18 NOTE — Progress Notes (Signed)
Location:   [facility] Room Number: [place] of Service:  SNF [phone]) Provider:  Toni Arthurs, NP-C  Kirk Ruths, MD  Patient Care Team: Kirk Ruths, MD as PCP - General (Internal Medicine) Einar Pheasant, MD (Internal Medicine)  Extended Emergency Contact Information Primary Emergency Contact: Duwayne Heck Address: [address]          [place], [place] [address] [name] of [place] Phone: [phone] Relation: None Secondary Emergency Contact: [name] Address: [address]          [place], [place] [address] Home Phone: [phone] Relation: None  Code Status:  DNR Goals of care: Advanced Directive information Advanced Directives 10/18/2016  Does Patient Have a Medical Advance Directive? Yes  Type of Advance Directive Out of facility DNR (pink MOST or yellow form)  Does patient want to make changes to medical advance directive? No - Patient declined  Copy of [place] in Chart? -     Chief Complaint  Patient presents with  . Medical Management of Chronic Issues    Routine Visit    HPI:  Pt is a 83 y.o. female seen today for medical management of chronic diseases. Patient has been stable over the past month. She continues to have a very slow decline in cognition and mobility. She spends the majority of her day in a recliner making her pot holders. Patient is very slow to speak-responded now. However, she always has a big smile on her face when spoken to. She tries to communicate, but her words are mostly unintelligible now. When asked directly, patient denies pain or any other negative symptoms. Patient reports she is doing well. No falls this past month. Pt is being followed by Hospice for symptom management and monitoring. Vital signs stable. No other complaints.   Past Medical History:  Diagnosis Date  . Anemia   . Atrial fibrillation ([place])    post op, converted to NSR wtih amiodarone  .  Bladder cancer ([place]) 2003   s/p resection and chemotherapy, followed by Dr Jacqlyn Larsen  . COPD (chronic obstructive pulmonary disease) ([place])   . Dementia   . Diabetes mellitus ([facility])   . Diabetes mellitus without complication ([place])   . GERD (gastroesophageal reflux disease)   . Hypercholesterolemia   . Hyperlipemia   . Hypertension   . Hypothyroidism   . Inner ear disease   . Lung cancer ([place])    squamous cell s/p left upper lobe llingulectomy 12/08/04 - Dr Arlyce Dice   Past Surgical History:  Procedure Laterality Date  . BLADDER SURGERY  2003   for bladder cancer - Dr Jacqlyn Larsen  . CHOLECYSTECTOMY  1986  . LUNG CANCER SURGERY  12/08/04   s/p left upper lobe lingulectomy - Dr Arlyce Dice    Allergies  Allergen Reactions  . Lovenox [Enoxaparin Sodium]   . Novolog [Insulin Aspart]     Allergies as of 10/18/2016      Reactions   Lovenox [enoxaparin Sodium]    Novolog [insulin Aspart]       Medication List       Accurate as of 10/18/16 11:28 AM. Always use your most recent med list.          acetaminophen 650 MG CR tablet Commonly known as:  TYLENOL Take 650 mg by mouth 3 (three) times daily. Do not exceed 3000 mg in 24 hr   bisacodyl 10 MG suppository Commonly known as:  DULCOLAX Place 10 mg rectally  daily as needed for mild constipation or moderate constipation. *If Milk of Magnesia does not resolve constipation.*   feeding supplement (GLUCERNA SHAKE) Liqd Take 237 mLs by mouth 3 (three) times daily between meals. Resident prefers vanilla   gabapentin 100 MG capsule Commonly known as:  NEURONTIN Take 100 mg by mouth daily. 9 am   gabapentin 300 MG capsule Commonly known as:  NEURONTIN Take 300 mg by mouth at bedtime.   glimepiride 2 MG tablet Commonly known as:  AMARYL Take 3 mg by mouth 2 (two) times daily. 1 and 1/2 tablet = 3 mg   HYDROcodone-acetaminophen 5-325 MG tablet Commonly known as:  NORCO/VICODIN Take 1 tablet by mouth every 4 (four) hours as needed. Do not  exceed 3000 mg of tylenol in 24 hours. Consider all sources.   ipratropium-albuterol 0.5-2.5 (3) MG/3ML Soln Commonly known as:  DUONEB Take 3 mLs by nebulization 3 (three) times daily.   ipratropium-albuterol 0.5-2.5 (3) MG/3ML Soln Commonly known as:  DUONEB Take 3 mLs by nebulization every 6 (six) hours as needed.   Lidocaine 4 % Ptch Apply 1 patch topically daily. Apply at 9 am to area on lower back daily. Remove after 12 hours   losartan 50 MG tablet Commonly known as:  COZAAR Take 50 mg by mouth daily.   MAG-AL PLUS XS 500-938-18 MG/5ML suspension Generic drug:  alum & mag hydroxide-simeth Take 15 mLs by mouth 4 (four) times daily. Give with or right after meals and at bedtime   magnesium hydroxide 400 MG/5ML suspension Commonly known as:  MILK OF MAGNESIA Take 30 mLs by mouth every 4 (four) hours as needed for mild constipation. If Constipation/ no BM for 2 days.  *no results in 24 hours may administer bisacodyl suppository*   Melatonin 3 MG Tabs Take 6 mg by mouth at bedtime. 2 tabs   metoprolol succinate 25 MG 24 hr tablet Commonly known as:  TOPROL-XL Take 25 mg by mouth daily.   mirtazapine 15 MG tablet Commonly known as:  REMERON Take 15 mg by mouth at bedtime.   montelukast 10 MG tablet Commonly known as:  SINGULAIR Take 10 mg by mouth at bedtime.   nystatin powder Commonly known as:  MYCOSTATIN/NYSTOP Apply thin film of powder topically underneath each breast every shift until healed for redness/itching.   omeprazole 20 MG capsule Commonly known as:  PRILOSEC Take 20 mg by mouth 2 (two) times daily.   polyethylene glycol packet Commonly known as:  MIRALAX / GLYCOLAX Take 17 g by mouth daily.   rOPINIRole 0.25 MG tablet Commonly known as:  REQUIP Take 1 tablet (0.25 mg total) by mouth at bedtime.   sennosides-docusate sodium 8.6-50 MG tablet Commonly known as:  SENOKOT-S Take 1 tablet by mouth 2 (two) times daily.   torsemide 5 MG  tablet Commonly known as:  DEMADEX Take 5 mg by mouth daily.   trolamine salicylate 10 % cream Commonly known as:  ASPERCREME Apply 1 application topically 3 (three) times daily as needed for muscle pain. Apply and massage on the right hip/outer thigh.       Review of Systems  Unable to perform ROS: Dementia  Constitutional: Negative.   HENT: Negative for congestion, postnasal drip, rhinorrhea, sinus pressure, trouble swallowing and voice change.   Eyes: Negative.   Respiratory: Negative for cough, choking, chest tightness, shortness of breath and wheezing.   Cardiovascular: Negative for chest pain and leg swelling.  Gastrointestinal: Negative.   Genitourinary: Negative.   Musculoskeletal:  Negative.   Skin: Positive for color change.  Neurological: Positive for speech difficulty.  Psychiatric/Behavioral: Positive for confusion.  All other systems reviewed and are negative.   Immunization History  Administered Date(s) Administered  . Influenza Split 12/05/2011  . Influenza-Unspecified 10/21/2013, 10/08/2014, 10/08/2015  . PPD Test 11/05/2015   Pertinent  Health Maintenance Due  Topic Date Due  . FOOT EXAM  12/05/1943  . OPHTHALMOLOGY EXAM  12/05/1943  . DEXA SCAN  12/05/2000  . PNA vac Low Risk Adult (1 of 2 - PCV13) 12/05/2000  . INFLUENZA VACCINE  08/22/2016  . HEMOGLOBIN A1C  11/20/2016   No flowsheet data found. Functional Status Survey:    Vitals:   10/18/16 1042  BP: 128/62  Pulse: 70  Resp: 18  Temp: 97.9 F (36.6 C)  SpO2: 95%  Weight: 140 lb 4.8 oz (63.6 kg)  Height: 5\' 5"  (1.651 m)   Body mass index is 23.35 kg/m. Physical Exam  Constitutional: She is oriented to person, place, and time. She appears well-developed and well-nourished. No distress.  HENT:  Head: Normocephalic and atraumatic.  Mouth/Throat: Oropharynx is clear and moist.  Eyes: Pupils are equal, round, and reactive to light. Conjunctivae and EOM are normal.  Neck: Normal range of  motion. Neck supple. No JVD present.  Cardiovascular: Normal rate, regular rhythm, normal heart sounds and intact distal pulses.  Exam reveals no gallop and no friction rub.   No murmur heard. Pulses:      Dorsalis pedis pulses are 2+ on the right side, and 2+ on the left side.  Pulmonary/Chest: Effort normal and breath sounds normal. No respiratory distress. She has no decreased breath sounds. She has no wheezes. She has no rhonchi. She has no rales. She exhibits no tenderness.  Abdominal: Soft. Bowel sounds are normal. She exhibits no distension. There is no tenderness. There is no rebound and no guarding.  Lymphadenopathy:    She has no cervical adenopathy.  Neurological: She is alert and oriented to person, place, and time.  Skin: Skin is warm and dry. No rash noted. She is not diaphoretic. No pallor.  Psychiatric: She has a normal mood and affect. Her behavior is normal. Judgment and thought content normal. Her speech is delayed. Cognition and memory are impaired. She exhibits abnormal recent memory.  Nursing note and vitals reviewed.   Labs reviewed:  Recent Labs  10/21/15 1850 05/21/16 1300 07/13/16 0710  NA 141 142 140  K 4.0 3.7 3.9  CL 107 105 106  CO2 27 25 26   GLUCOSE 101* 158* 128*  BUN 22* 23* 25*  CREATININE 0.93 0.96 0.80  CALCIUM 9.4 9.1 9.0  MG 1.5* 1.7  --     Recent Labs  10/21/15 1850 05/21/16 1300 07/13/16 0710  AST 22 25 15   ALT 18 18 14   ALKPHOS 44 47 46  BILITOT 0.6 0.5 0.6  PROT 6.9 6.7 6.4*  ALBUMIN 4.0 3.9 3.6    Recent Labs  10/21/15 1850 05/21/16 1300 07/13/16 0710  WBC 10.4 8.9 9.2  NEUTROABS 5.3 5.7 4.7  HGB 14.3 14.3 13.6  HCT 42.9 44.2 40.4  MCV 90.6 91.2 89.1  PLT 294 292 308   Lab Results  Component Value Date   TSH 1.359 05/21/2016   Lab Results  Component Value Date   HGBA1C 6.8 (H) 05/21/2016   Lab Results  Component Value Date   CHOL 170 10/21/2015   HDL 40 (L) 10/21/2015   LDLCALC 92 10/21/2015   TRIG  188  (H) 10/21/2015   CHOLHDL 4.3 10/21/2015    Significant Diagnostic Results in last 30 days:  No results found.  Assessment/Plan 1. Late onset Alzheimer's disease without behavioral disturbance 2. Dementia associated with other underlying disease without behavioral disturbance  Stable  Hospice services  3. Hx of falling  Stable  4. Anemia, unspecified type  Stable  5. Essential (primary) hypertension  Stable  Continue losartan 50 mg by mouth daily  Continue metoprolol ER 25 mg tablet by mouth daily  6. Chronic obstructive pulmonary disease, unspecified COPD type (HCC)  Stable  Continue Advair HFA 115-51mcg 2 puffs every 12 hours for COPD  Continue Singulair 10 mg one tablet by mouth daily at bedtime  Continue Spiriva 18 g 1 capsule inhaled once daily   7. Type 2 diabetes mellitus without complication, without long-term current use of insulin (HCC)  Stable  Continue glimepiride 2 mg tablets 1-1/2 tabs by mouth twice a day  Continue metformin 500 mg by mouth twice a day  8. Gastroesophageal reflux disease without esophagitis  Stable  Continue omeprazole 20 mg by mouth daily  9. Pure hypercholesterolemia  Stable  10. Hypothyroidism, unspecified type  Stable  Monitoring  11. Constipation, unspecified constipation type  Stable  Milk of magnesia 30 mL by mouth every 4 hours when necessary constipation  Bisacodyl suppository 10 mg per rectum daily when necessary  12. Allergic rhinitis, unspecified seasonality, unspecified trigger  Stable  Continue cetirizine 5 mg by mouth daily at bedtime  13. Unsp psychosis not due to a substance or known physiol cond  Stable  14. Dysphagia, oropharyngeal phase  Stable  15. Vitamin B12 deficiency anemia due to intrinsic factor deficiency  Stable  Continue folic acid 643 g by mouth daily  Continue cyanocobalamin 250 g by mouth daily  16. Osteoarthritis of right hip, unspecified osteoarthritis  type  Stable  Continue Tylenol 650 mg by mouth 3 times a day  Continue cholecalciferol 1000 units by mouth daily  Continue Os-Cal 500+ D31 tab by mouth twice a day  Continue gabapentin 300 mg by mouth daily at bedtime for pain  Continue gabapentin 100 mg by mouth daily pain  Continue hydrocodone-acetaminophen 5-3 25 mg 1 tablet by mouth every 4 hours when necessary pain  17. Cognitive communication deficit  Stable  18. Insomnia, unspecified type  Stable  Melatonin 3 mg tablets 2 tablets by mouth daily at bedtime  19. Edema, unspecified type  Stable   Continue torsemide 5 mg tablet by mouth daily  20. Hypomagnesemia  Stable  Attending magnesium oxide 250 mg tablet 1 tablet by mouth daily  21. Renal insufficiency  Stable  Renally adjust medications as appropriate  22. RLS (restless legs syndrome)  Stable  Continue ropinirole 0.25 mg by mouth daily at bedtime  23. Friction injury to skin  Stable   Skin prep to B- Great toes BID  Skin Prep to B-heels BID   Family/ staff Communication:   Total Time:   Documentation:   Face to Face:   Family/Phone:    Labs/tests ordered:    Medication list reviewed and assessed for continued appropriateness. Monthly medication orders reviewed and signed.  Vikki Ports, NP-C Geriatrics [facility] [phone], [place] [address] Cell Phone (Mon-Fri 8am-5pm):  [phone] On Call:  [phone] & follow prompts after 5pm & weekends Office Phone:  [phone] Office Fax:  [phone]

## 2016-10-19 DIAGNOSIS — I10 Essential (primary) hypertension: Secondary | ICD-10-CM | POA: Diagnosis not present

## 2016-10-19 DIAGNOSIS — Z8551 Personal history of malignant neoplasm of bladder: Secondary | ICD-10-CM | POA: Diagnosis not present

## 2016-10-19 DIAGNOSIS — J449 Chronic obstructive pulmonary disease, unspecified: Secondary | ICD-10-CM | POA: Diagnosis not present

## 2016-10-19 DIAGNOSIS — E46 Unspecified protein-calorie malnutrition: Secondary | ICD-10-CM | POA: Diagnosis not present

## 2016-10-19 DIAGNOSIS — K219 Gastro-esophageal reflux disease without esophagitis: Secondary | ICD-10-CM | POA: Diagnosis not present

## 2016-10-19 DIAGNOSIS — I4891 Unspecified atrial fibrillation: Secondary | ICD-10-CM | POA: Diagnosis not present

## 2016-10-19 DIAGNOSIS — E039 Hypothyroidism, unspecified: Secondary | ICD-10-CM | POA: Diagnosis not present

## 2016-10-19 DIAGNOSIS — Z85118 Personal history of other malignant neoplasm of bronchus and lung: Secondary | ICD-10-CM | POA: Diagnosis not present

## 2016-10-19 DIAGNOSIS — E119 Type 2 diabetes mellitus without complications: Secondary | ICD-10-CM | POA: Diagnosis not present

## 2016-10-19 DIAGNOSIS — R627 Adult failure to thrive: Secondary | ICD-10-CM | POA: Diagnosis not present

## 2016-10-19 DIAGNOSIS — D649 Anemia, unspecified: Secondary | ICD-10-CM | POA: Diagnosis not present

## 2016-10-19 DIAGNOSIS — G309 Alzheimer's disease, unspecified: Secondary | ICD-10-CM | POA: Diagnosis not present

## 2016-10-20 DIAGNOSIS — K219 Gastro-esophageal reflux disease without esophagitis: Secondary | ICD-10-CM | POA: Diagnosis not present

## 2016-10-20 DIAGNOSIS — E119 Type 2 diabetes mellitus without complications: Secondary | ICD-10-CM | POA: Diagnosis not present

## 2016-10-20 DIAGNOSIS — E46 Unspecified protein-calorie malnutrition: Secondary | ICD-10-CM | POA: Diagnosis not present

## 2016-10-20 DIAGNOSIS — Z8551 Personal history of malignant neoplasm of bladder: Secondary | ICD-10-CM | POA: Diagnosis not present

## 2016-10-20 DIAGNOSIS — I4891 Unspecified atrial fibrillation: Secondary | ICD-10-CM | POA: Diagnosis not present

## 2016-10-20 DIAGNOSIS — Z85118 Personal history of other malignant neoplasm of bronchus and lung: Secondary | ICD-10-CM | POA: Diagnosis not present

## 2016-10-20 DIAGNOSIS — D649 Anemia, unspecified: Secondary | ICD-10-CM | POA: Diagnosis not present

## 2016-10-20 DIAGNOSIS — R627 Adult failure to thrive: Secondary | ICD-10-CM | POA: Diagnosis not present

## 2016-10-20 DIAGNOSIS — I10 Essential (primary) hypertension: Secondary | ICD-10-CM | POA: Diagnosis not present

## 2016-10-20 DIAGNOSIS — G309 Alzheimer's disease, unspecified: Secondary | ICD-10-CM | POA: Diagnosis not present

## 2016-10-20 DIAGNOSIS — J449 Chronic obstructive pulmonary disease, unspecified: Secondary | ICD-10-CM | POA: Diagnosis not present

## 2016-10-20 DIAGNOSIS — E039 Hypothyroidism, unspecified: Secondary | ICD-10-CM | POA: Diagnosis not present

## 2016-10-21 DIAGNOSIS — R627 Adult failure to thrive: Secondary | ICD-10-CM | POA: Diagnosis not present

## 2016-10-21 DIAGNOSIS — I4891 Unspecified atrial fibrillation: Secondary | ICD-10-CM | POA: Diagnosis not present

## 2016-10-21 DIAGNOSIS — E46 Unspecified protein-calorie malnutrition: Secondary | ICD-10-CM | POA: Diagnosis not present

## 2016-10-21 DIAGNOSIS — J449 Chronic obstructive pulmonary disease, unspecified: Secondary | ICD-10-CM | POA: Diagnosis not present

## 2016-10-21 DIAGNOSIS — Z8551 Personal history of malignant neoplasm of bladder: Secondary | ICD-10-CM | POA: Diagnosis not present

## 2016-10-21 DIAGNOSIS — G309 Alzheimer's disease, unspecified: Secondary | ICD-10-CM | POA: Diagnosis not present

## 2016-10-21 DIAGNOSIS — D649 Anemia, unspecified: Secondary | ICD-10-CM | POA: Diagnosis not present

## 2016-10-21 DIAGNOSIS — I10 Essential (primary) hypertension: Secondary | ICD-10-CM | POA: Diagnosis not present

## 2016-10-21 DIAGNOSIS — K219 Gastro-esophageal reflux disease without esophagitis: Secondary | ICD-10-CM | POA: Diagnosis not present

## 2016-10-21 DIAGNOSIS — E039 Hypothyroidism, unspecified: Secondary | ICD-10-CM | POA: Diagnosis not present

## 2016-10-21 DIAGNOSIS — Z85118 Personal history of other malignant neoplasm of bronchus and lung: Secondary | ICD-10-CM | POA: Diagnosis not present

## 2016-10-21 DIAGNOSIS — E119 Type 2 diabetes mellitus without complications: Secondary | ICD-10-CM | POA: Diagnosis not present

## 2016-10-22 ENCOUNTER — Encounter
Admission: RE | Admit: 2016-10-22 | Discharge: 2016-10-22 | Disposition: A | Discharge status: Home / Self Care | Payer: Medicare Other | Source: Ambulatory Visit | Attending: Internal Medicine | Admitting: Internal Medicine

## 2016-10-22 DIAGNOSIS — G309 Alzheimer's disease, unspecified: Secondary | ICD-10-CM | POA: Diagnosis not present

## 2016-10-22 DIAGNOSIS — J449 Chronic obstructive pulmonary disease, unspecified: Secondary | ICD-10-CM | POA: Diagnosis not present

## 2016-10-22 DIAGNOSIS — I10 Essential (primary) hypertension: Secondary | ICD-10-CM | POA: Diagnosis not present

## 2016-10-22 DIAGNOSIS — R627 Adult failure to thrive: Secondary | ICD-10-CM | POA: Diagnosis not present

## 2016-10-22 DIAGNOSIS — Z8551 Personal history of malignant neoplasm of bladder: Secondary | ICD-10-CM | POA: Diagnosis not present

## 2016-10-22 DIAGNOSIS — E46 Unspecified protein-calorie malnutrition: Secondary | ICD-10-CM | POA: Diagnosis not present

## 2016-10-22 DIAGNOSIS — I4891 Unspecified atrial fibrillation: Secondary | ICD-10-CM | POA: Diagnosis not present

## 2016-10-22 DIAGNOSIS — E039 Hypothyroidism, unspecified: Secondary | ICD-10-CM | POA: Diagnosis not present

## 2016-10-22 DIAGNOSIS — K219 Gastro-esophageal reflux disease without esophagitis: Secondary | ICD-10-CM | POA: Diagnosis not present

## 2016-10-22 DIAGNOSIS — Z85118 Personal history of other malignant neoplasm of bronchus and lung: Secondary | ICD-10-CM | POA: Diagnosis not present

## 2016-10-22 DIAGNOSIS — E119 Type 2 diabetes mellitus without complications: Secondary | ICD-10-CM | POA: Diagnosis not present

## 2016-10-22 DIAGNOSIS — D649 Anemia, unspecified: Secondary | ICD-10-CM | POA: Diagnosis not present

## 2016-10-23 DIAGNOSIS — E46 Unspecified protein-calorie malnutrition: Secondary | ICD-10-CM | POA: Diagnosis not present

## 2016-10-23 DIAGNOSIS — I4891 Unspecified atrial fibrillation: Secondary | ICD-10-CM | POA: Diagnosis not present

## 2016-10-23 DIAGNOSIS — Z8551 Personal history of malignant neoplasm of bladder: Secondary | ICD-10-CM | POA: Diagnosis not present

## 2016-10-23 DIAGNOSIS — G309 Alzheimer's disease, unspecified: Secondary | ICD-10-CM | POA: Diagnosis not present

## 2016-10-23 DIAGNOSIS — E039 Hypothyroidism, unspecified: Secondary | ICD-10-CM | POA: Diagnosis not present

## 2016-10-23 DIAGNOSIS — J449 Chronic obstructive pulmonary disease, unspecified: Secondary | ICD-10-CM | POA: Diagnosis not present

## 2016-10-23 DIAGNOSIS — Z85118 Personal history of other malignant neoplasm of bronchus and lung: Secondary | ICD-10-CM | POA: Diagnosis not present

## 2016-10-23 DIAGNOSIS — I10 Essential (primary) hypertension: Secondary | ICD-10-CM | POA: Diagnosis not present

## 2016-10-23 DIAGNOSIS — R627 Adult failure to thrive: Secondary | ICD-10-CM | POA: Diagnosis not present

## 2016-10-23 DIAGNOSIS — K219 Gastro-esophageal reflux disease without esophagitis: Secondary | ICD-10-CM | POA: Diagnosis not present

## 2016-10-23 DIAGNOSIS — D649 Anemia, unspecified: Secondary | ICD-10-CM | POA: Diagnosis not present

## 2016-10-23 DIAGNOSIS — E119 Type 2 diabetes mellitus without complications: Secondary | ICD-10-CM | POA: Diagnosis not present

## 2016-10-24 DIAGNOSIS — G309 Alzheimer's disease, unspecified: Secondary | ICD-10-CM | POA: Diagnosis not present

## 2016-10-24 DIAGNOSIS — Z85118 Personal history of other malignant neoplasm of bronchus and lung: Secondary | ICD-10-CM | POA: Diagnosis not present

## 2016-10-24 DIAGNOSIS — I10 Essential (primary) hypertension: Secondary | ICD-10-CM | POA: Diagnosis not present

## 2016-10-24 DIAGNOSIS — E46 Unspecified protein-calorie malnutrition: Secondary | ICD-10-CM | POA: Diagnosis not present

## 2016-10-24 DIAGNOSIS — E039 Hypothyroidism, unspecified: Secondary | ICD-10-CM | POA: Diagnosis not present

## 2016-10-24 DIAGNOSIS — R627 Adult failure to thrive: Secondary | ICD-10-CM | POA: Diagnosis not present

## 2016-10-24 DIAGNOSIS — Z8551 Personal history of malignant neoplasm of bladder: Secondary | ICD-10-CM | POA: Diagnosis not present

## 2016-10-24 DIAGNOSIS — J449 Chronic obstructive pulmonary disease, unspecified: Secondary | ICD-10-CM | POA: Diagnosis not present

## 2016-10-24 DIAGNOSIS — E119 Type 2 diabetes mellitus without complications: Secondary | ICD-10-CM | POA: Diagnosis not present

## 2016-10-24 DIAGNOSIS — I4891 Unspecified atrial fibrillation: Secondary | ICD-10-CM | POA: Diagnosis not present

## 2016-10-24 DIAGNOSIS — D649 Anemia, unspecified: Secondary | ICD-10-CM | POA: Diagnosis not present

## 2016-10-24 DIAGNOSIS — K219 Gastro-esophageal reflux disease without esophagitis: Secondary | ICD-10-CM | POA: Diagnosis not present

## 2016-10-25 DIAGNOSIS — D649 Anemia, unspecified: Secondary | ICD-10-CM | POA: Diagnosis not present

## 2016-10-25 DIAGNOSIS — R627 Adult failure to thrive: Secondary | ICD-10-CM | POA: Diagnosis not present

## 2016-10-25 DIAGNOSIS — G309 Alzheimer's disease, unspecified: Secondary | ICD-10-CM | POA: Diagnosis not present

## 2016-10-25 DIAGNOSIS — Z8551 Personal history of malignant neoplasm of bladder: Secondary | ICD-10-CM | POA: Diagnosis not present

## 2016-10-25 DIAGNOSIS — Z85118 Personal history of other malignant neoplasm of bronchus and lung: Secondary | ICD-10-CM | POA: Diagnosis not present

## 2016-10-25 DIAGNOSIS — K219 Gastro-esophageal reflux disease without esophagitis: Secondary | ICD-10-CM | POA: Diagnosis not present

## 2016-10-25 DIAGNOSIS — I4891 Unspecified atrial fibrillation: Secondary | ICD-10-CM | POA: Diagnosis not present

## 2016-10-25 DIAGNOSIS — I10 Essential (primary) hypertension: Secondary | ICD-10-CM | POA: Diagnosis not present

## 2016-10-25 DIAGNOSIS — E119 Type 2 diabetes mellitus without complications: Secondary | ICD-10-CM | POA: Diagnosis not present

## 2016-10-25 DIAGNOSIS — J449 Chronic obstructive pulmonary disease, unspecified: Secondary | ICD-10-CM | POA: Diagnosis not present

## 2016-10-25 DIAGNOSIS — E46 Unspecified protein-calorie malnutrition: Secondary | ICD-10-CM | POA: Diagnosis not present

## 2016-10-25 DIAGNOSIS — E039 Hypothyroidism, unspecified: Secondary | ICD-10-CM | POA: Diagnosis not present

## 2016-10-26 DIAGNOSIS — E46 Unspecified protein-calorie malnutrition: Secondary | ICD-10-CM | POA: Diagnosis not present

## 2016-10-26 DIAGNOSIS — J449 Chronic obstructive pulmonary disease, unspecified: Secondary | ICD-10-CM | POA: Diagnosis not present

## 2016-10-26 DIAGNOSIS — Z8551 Personal history of malignant neoplasm of bladder: Secondary | ICD-10-CM | POA: Diagnosis not present

## 2016-10-26 DIAGNOSIS — K219 Gastro-esophageal reflux disease without esophagitis: Secondary | ICD-10-CM | POA: Diagnosis not present

## 2016-10-26 DIAGNOSIS — I10 Essential (primary) hypertension: Secondary | ICD-10-CM | POA: Diagnosis not present

## 2016-10-26 DIAGNOSIS — Z85118 Personal history of other malignant neoplasm of bronchus and lung: Secondary | ICD-10-CM | POA: Diagnosis not present

## 2016-10-26 DIAGNOSIS — G309 Alzheimer's disease, unspecified: Secondary | ICD-10-CM | POA: Diagnosis not present

## 2016-10-26 DIAGNOSIS — D649 Anemia, unspecified: Secondary | ICD-10-CM | POA: Diagnosis not present

## 2016-10-26 DIAGNOSIS — R627 Adult failure to thrive: Secondary | ICD-10-CM | POA: Diagnosis not present

## 2016-10-26 DIAGNOSIS — I4891 Unspecified atrial fibrillation: Secondary | ICD-10-CM | POA: Diagnosis not present

## 2016-10-26 DIAGNOSIS — E039 Hypothyroidism, unspecified: Secondary | ICD-10-CM | POA: Diagnosis not present

## 2016-10-26 DIAGNOSIS — E119 Type 2 diabetes mellitus without complications: Secondary | ICD-10-CM | POA: Diagnosis not present

## 2016-10-27 DIAGNOSIS — J449 Chronic obstructive pulmonary disease, unspecified: Secondary | ICD-10-CM | POA: Diagnosis not present

## 2016-10-27 DIAGNOSIS — D649 Anemia, unspecified: Secondary | ICD-10-CM | POA: Diagnosis not present

## 2016-10-27 DIAGNOSIS — E46 Unspecified protein-calorie malnutrition: Secondary | ICD-10-CM | POA: Diagnosis not present

## 2016-10-27 DIAGNOSIS — Z8551 Personal history of malignant neoplasm of bladder: Secondary | ICD-10-CM | POA: Diagnosis not present

## 2016-10-27 DIAGNOSIS — E039 Hypothyroidism, unspecified: Secondary | ICD-10-CM | POA: Diagnosis not present

## 2016-10-27 DIAGNOSIS — E119 Type 2 diabetes mellitus without complications: Secondary | ICD-10-CM | POA: Diagnosis not present

## 2016-10-27 DIAGNOSIS — I10 Essential (primary) hypertension: Secondary | ICD-10-CM | POA: Diagnosis not present

## 2016-10-27 DIAGNOSIS — K219 Gastro-esophageal reflux disease without esophagitis: Secondary | ICD-10-CM | POA: Diagnosis not present

## 2016-10-27 DIAGNOSIS — R627 Adult failure to thrive: Secondary | ICD-10-CM | POA: Diagnosis not present

## 2016-10-27 DIAGNOSIS — I4891 Unspecified atrial fibrillation: Secondary | ICD-10-CM | POA: Diagnosis not present

## 2016-10-27 DIAGNOSIS — Z85118 Personal history of other malignant neoplasm of bronchus and lung: Secondary | ICD-10-CM | POA: Diagnosis not present

## 2016-10-27 DIAGNOSIS — G309 Alzheimer's disease, unspecified: Secondary | ICD-10-CM | POA: Diagnosis not present

## 2016-10-28 DIAGNOSIS — I10 Essential (primary) hypertension: Secondary | ICD-10-CM | POA: Diagnosis not present

## 2016-10-28 DIAGNOSIS — K219 Gastro-esophageal reflux disease without esophagitis: Secondary | ICD-10-CM | POA: Diagnosis not present

## 2016-10-28 DIAGNOSIS — G309 Alzheimer's disease, unspecified: Secondary | ICD-10-CM | POA: Diagnosis not present

## 2016-10-28 DIAGNOSIS — E119 Type 2 diabetes mellitus without complications: Secondary | ICD-10-CM | POA: Diagnosis not present

## 2016-10-28 DIAGNOSIS — I4891 Unspecified atrial fibrillation: Secondary | ICD-10-CM | POA: Diagnosis not present

## 2016-10-28 DIAGNOSIS — Z8551 Personal history of malignant neoplasm of bladder: Secondary | ICD-10-CM | POA: Diagnosis not present

## 2016-10-28 DIAGNOSIS — D649 Anemia, unspecified: Secondary | ICD-10-CM | POA: Diagnosis not present

## 2016-10-28 DIAGNOSIS — R627 Adult failure to thrive: Secondary | ICD-10-CM | POA: Diagnosis not present

## 2016-10-28 DIAGNOSIS — Z85118 Personal history of other malignant neoplasm of bronchus and lung: Secondary | ICD-10-CM | POA: Diagnosis not present

## 2016-10-28 DIAGNOSIS — E039 Hypothyroidism, unspecified: Secondary | ICD-10-CM | POA: Diagnosis not present

## 2016-10-28 DIAGNOSIS — E46 Unspecified protein-calorie malnutrition: Secondary | ICD-10-CM | POA: Diagnosis not present

## 2016-10-28 DIAGNOSIS — J449 Chronic obstructive pulmonary disease, unspecified: Secondary | ICD-10-CM | POA: Diagnosis not present

## 2016-10-29 DIAGNOSIS — I4891 Unspecified atrial fibrillation: Secondary | ICD-10-CM | POA: Diagnosis not present

## 2016-10-29 DIAGNOSIS — E119 Type 2 diabetes mellitus without complications: Secondary | ICD-10-CM | POA: Diagnosis not present

## 2016-10-29 DIAGNOSIS — Z8551 Personal history of malignant neoplasm of bladder: Secondary | ICD-10-CM | POA: Diagnosis not present

## 2016-10-29 DIAGNOSIS — R627 Adult failure to thrive: Secondary | ICD-10-CM | POA: Diagnosis not present

## 2016-10-29 DIAGNOSIS — G309 Alzheimer's disease, unspecified: Secondary | ICD-10-CM | POA: Diagnosis not present

## 2016-10-29 DIAGNOSIS — E46 Unspecified protein-calorie malnutrition: Secondary | ICD-10-CM | POA: Diagnosis not present

## 2016-10-29 DIAGNOSIS — I10 Essential (primary) hypertension: Secondary | ICD-10-CM | POA: Diagnosis not present

## 2016-10-29 DIAGNOSIS — D649 Anemia, unspecified: Secondary | ICD-10-CM | POA: Diagnosis not present

## 2016-10-29 DIAGNOSIS — E039 Hypothyroidism, unspecified: Secondary | ICD-10-CM | POA: Diagnosis not present

## 2016-10-29 DIAGNOSIS — J449 Chronic obstructive pulmonary disease, unspecified: Secondary | ICD-10-CM | POA: Diagnosis not present

## 2016-10-29 DIAGNOSIS — Z85118 Personal history of other malignant neoplasm of bronchus and lung: Secondary | ICD-10-CM | POA: Diagnosis not present

## 2016-10-29 DIAGNOSIS — K219 Gastro-esophageal reflux disease without esophagitis: Secondary | ICD-10-CM | POA: Diagnosis not present

## 2016-10-30 DIAGNOSIS — D649 Anemia, unspecified: Secondary | ICD-10-CM | POA: Diagnosis not present

## 2016-10-30 DIAGNOSIS — E119 Type 2 diabetes mellitus without complications: Secondary | ICD-10-CM | POA: Diagnosis not present

## 2016-10-30 DIAGNOSIS — I4891 Unspecified atrial fibrillation: Secondary | ICD-10-CM | POA: Diagnosis not present

## 2016-10-30 DIAGNOSIS — G309 Alzheimer's disease, unspecified: Secondary | ICD-10-CM | POA: Diagnosis not present

## 2016-10-30 DIAGNOSIS — I10 Essential (primary) hypertension: Secondary | ICD-10-CM | POA: Diagnosis not present

## 2016-10-30 DIAGNOSIS — R627 Adult failure to thrive: Secondary | ICD-10-CM | POA: Diagnosis not present

## 2016-10-30 DIAGNOSIS — Z85118 Personal history of other malignant neoplasm of bronchus and lung: Secondary | ICD-10-CM | POA: Diagnosis not present

## 2016-10-30 DIAGNOSIS — E46 Unspecified protein-calorie malnutrition: Secondary | ICD-10-CM | POA: Diagnosis not present

## 2016-10-30 DIAGNOSIS — J449 Chronic obstructive pulmonary disease, unspecified: Secondary | ICD-10-CM | POA: Diagnosis not present

## 2016-10-30 DIAGNOSIS — K219 Gastro-esophageal reflux disease without esophagitis: Secondary | ICD-10-CM | POA: Diagnosis not present

## 2016-10-30 DIAGNOSIS — E039 Hypothyroidism, unspecified: Secondary | ICD-10-CM | POA: Diagnosis not present

## 2016-10-30 DIAGNOSIS — Z8551 Personal history of malignant neoplasm of bladder: Secondary | ICD-10-CM | POA: Diagnosis not present

## 2016-10-31 DIAGNOSIS — I4891 Unspecified atrial fibrillation: Secondary | ICD-10-CM | POA: Diagnosis not present

## 2016-10-31 DIAGNOSIS — G309 Alzheimer's disease, unspecified: Secondary | ICD-10-CM | POA: Diagnosis not present

## 2016-10-31 DIAGNOSIS — E039 Hypothyroidism, unspecified: Secondary | ICD-10-CM | POA: Diagnosis not present

## 2016-10-31 DIAGNOSIS — D649 Anemia, unspecified: Secondary | ICD-10-CM | POA: Diagnosis not present

## 2016-10-31 DIAGNOSIS — K219 Gastro-esophageal reflux disease without esophagitis: Secondary | ICD-10-CM | POA: Diagnosis not present

## 2016-10-31 DIAGNOSIS — I10 Essential (primary) hypertension: Secondary | ICD-10-CM | POA: Diagnosis not present

## 2016-10-31 DIAGNOSIS — Z8551 Personal history of malignant neoplasm of bladder: Secondary | ICD-10-CM | POA: Diagnosis not present

## 2016-10-31 DIAGNOSIS — E119 Type 2 diabetes mellitus without complications: Secondary | ICD-10-CM | POA: Diagnosis not present

## 2016-10-31 DIAGNOSIS — J449 Chronic obstructive pulmonary disease, unspecified: Secondary | ICD-10-CM | POA: Diagnosis not present

## 2016-10-31 DIAGNOSIS — R627 Adult failure to thrive: Secondary | ICD-10-CM | POA: Diagnosis not present

## 2016-10-31 DIAGNOSIS — E46 Unspecified protein-calorie malnutrition: Secondary | ICD-10-CM | POA: Diagnosis not present

## 2016-10-31 DIAGNOSIS — Z85118 Personal history of other malignant neoplasm of bronchus and lung: Secondary | ICD-10-CM | POA: Diagnosis not present

## 2016-11-01 DIAGNOSIS — Z85118 Personal history of other malignant neoplasm of bronchus and lung: Secondary | ICD-10-CM | POA: Diagnosis not present

## 2016-11-01 DIAGNOSIS — I4891 Unspecified atrial fibrillation: Secondary | ICD-10-CM | POA: Diagnosis not present

## 2016-11-01 DIAGNOSIS — G309 Alzheimer's disease, unspecified: Secondary | ICD-10-CM | POA: Diagnosis not present

## 2016-11-01 DIAGNOSIS — I10 Essential (primary) hypertension: Secondary | ICD-10-CM | POA: Diagnosis not present

## 2016-11-01 DIAGNOSIS — R627 Adult failure to thrive: Secondary | ICD-10-CM | POA: Diagnosis not present

## 2016-11-01 DIAGNOSIS — D649 Anemia, unspecified: Secondary | ICD-10-CM | POA: Diagnosis not present

## 2016-11-01 DIAGNOSIS — K219 Gastro-esophageal reflux disease without esophagitis: Secondary | ICD-10-CM | POA: Diagnosis not present

## 2016-11-01 DIAGNOSIS — Z8551 Personal history of malignant neoplasm of bladder: Secondary | ICD-10-CM | POA: Diagnosis not present

## 2016-11-01 DIAGNOSIS — E119 Type 2 diabetes mellitus without complications: Secondary | ICD-10-CM | POA: Diagnosis not present

## 2016-11-01 DIAGNOSIS — J449 Chronic obstructive pulmonary disease, unspecified: Secondary | ICD-10-CM | POA: Diagnosis not present

## 2016-11-01 DIAGNOSIS — E039 Hypothyroidism, unspecified: Secondary | ICD-10-CM | POA: Diagnosis not present

## 2016-11-01 DIAGNOSIS — E46 Unspecified protein-calorie malnutrition: Secondary | ICD-10-CM | POA: Diagnosis not present

## 2016-11-02 DIAGNOSIS — D649 Anemia, unspecified: Secondary | ICD-10-CM | POA: Diagnosis not present

## 2016-11-02 DIAGNOSIS — I10 Essential (primary) hypertension: Secondary | ICD-10-CM | POA: Diagnosis not present

## 2016-11-02 DIAGNOSIS — J449 Chronic obstructive pulmonary disease, unspecified: Secondary | ICD-10-CM | POA: Diagnosis not present

## 2016-11-02 DIAGNOSIS — E46 Unspecified protein-calorie malnutrition: Secondary | ICD-10-CM | POA: Diagnosis not present

## 2016-11-02 DIAGNOSIS — G309 Alzheimer's disease, unspecified: Secondary | ICD-10-CM | POA: Diagnosis not present

## 2016-11-02 DIAGNOSIS — Z85118 Personal history of other malignant neoplasm of bronchus and lung: Secondary | ICD-10-CM | POA: Diagnosis not present

## 2016-11-02 DIAGNOSIS — E039 Hypothyroidism, unspecified: Secondary | ICD-10-CM | POA: Diagnosis not present

## 2016-11-02 DIAGNOSIS — E119 Type 2 diabetes mellitus without complications: Secondary | ICD-10-CM | POA: Diagnosis not present

## 2016-11-02 DIAGNOSIS — K219 Gastro-esophageal reflux disease without esophagitis: Secondary | ICD-10-CM | POA: Diagnosis not present

## 2016-11-02 DIAGNOSIS — R627 Adult failure to thrive: Secondary | ICD-10-CM | POA: Diagnosis not present

## 2016-11-02 DIAGNOSIS — I4891 Unspecified atrial fibrillation: Secondary | ICD-10-CM | POA: Diagnosis not present

## 2016-11-02 DIAGNOSIS — Z8551 Personal history of malignant neoplasm of bladder: Secondary | ICD-10-CM | POA: Diagnosis not present

## 2016-11-03 DIAGNOSIS — Z8551 Personal history of malignant neoplasm of bladder: Secondary | ICD-10-CM | POA: Diagnosis not present

## 2016-11-03 DIAGNOSIS — J449 Chronic obstructive pulmonary disease, unspecified: Secondary | ICD-10-CM | POA: Diagnosis not present

## 2016-11-03 DIAGNOSIS — Z85118 Personal history of other malignant neoplasm of bronchus and lung: Secondary | ICD-10-CM | POA: Diagnosis not present

## 2016-11-03 DIAGNOSIS — K219 Gastro-esophageal reflux disease without esophagitis: Secondary | ICD-10-CM | POA: Diagnosis not present

## 2016-11-03 DIAGNOSIS — I4891 Unspecified atrial fibrillation: Secondary | ICD-10-CM | POA: Diagnosis not present

## 2016-11-03 DIAGNOSIS — I10 Essential (primary) hypertension: Secondary | ICD-10-CM | POA: Diagnosis not present

## 2016-11-03 DIAGNOSIS — E119 Type 2 diabetes mellitus without complications: Secondary | ICD-10-CM | POA: Diagnosis not present

## 2016-11-03 DIAGNOSIS — D649 Anemia, unspecified: Secondary | ICD-10-CM | POA: Diagnosis not present

## 2016-11-03 DIAGNOSIS — R627 Adult failure to thrive: Secondary | ICD-10-CM | POA: Diagnosis not present

## 2016-11-03 DIAGNOSIS — E039 Hypothyroidism, unspecified: Secondary | ICD-10-CM | POA: Diagnosis not present

## 2016-11-03 DIAGNOSIS — E46 Unspecified protein-calorie malnutrition: Secondary | ICD-10-CM | POA: Diagnosis not present

## 2016-11-03 DIAGNOSIS — G309 Alzheimer's disease, unspecified: Secondary | ICD-10-CM | POA: Diagnosis not present

## 2016-11-04 DIAGNOSIS — I4891 Unspecified atrial fibrillation: Secondary | ICD-10-CM | POA: Diagnosis not present

## 2016-11-04 DIAGNOSIS — I10 Essential (primary) hypertension: Secondary | ICD-10-CM | POA: Diagnosis not present

## 2016-11-04 DIAGNOSIS — J449 Chronic obstructive pulmonary disease, unspecified: Secondary | ICD-10-CM | POA: Diagnosis not present

## 2016-11-04 DIAGNOSIS — E119 Type 2 diabetes mellitus without complications: Secondary | ICD-10-CM | POA: Diagnosis not present

## 2016-11-04 DIAGNOSIS — R627 Adult failure to thrive: Secondary | ICD-10-CM | POA: Diagnosis not present

## 2016-11-04 DIAGNOSIS — Z85118 Personal history of other malignant neoplasm of bronchus and lung: Secondary | ICD-10-CM | POA: Diagnosis not present

## 2016-11-04 DIAGNOSIS — E039 Hypothyroidism, unspecified: Secondary | ICD-10-CM | POA: Diagnosis not present

## 2016-11-04 DIAGNOSIS — K219 Gastro-esophageal reflux disease without esophagitis: Secondary | ICD-10-CM | POA: Diagnosis not present

## 2016-11-04 DIAGNOSIS — D649 Anemia, unspecified: Secondary | ICD-10-CM | POA: Diagnosis not present

## 2016-11-04 DIAGNOSIS — G309 Alzheimer's disease, unspecified: Secondary | ICD-10-CM | POA: Diagnosis not present

## 2016-11-04 DIAGNOSIS — Z8551 Personal history of malignant neoplasm of bladder: Secondary | ICD-10-CM | POA: Diagnosis not present

## 2016-11-04 DIAGNOSIS — E46 Unspecified protein-calorie malnutrition: Secondary | ICD-10-CM | POA: Diagnosis not present

## 2016-11-05 DIAGNOSIS — G309 Alzheimer's disease, unspecified: Secondary | ICD-10-CM | POA: Diagnosis not present

## 2016-11-05 DIAGNOSIS — E039 Hypothyroidism, unspecified: Secondary | ICD-10-CM | POA: Diagnosis not present

## 2016-11-05 DIAGNOSIS — E119 Type 2 diabetes mellitus without complications: Secondary | ICD-10-CM | POA: Diagnosis not present

## 2016-11-05 DIAGNOSIS — R627 Adult failure to thrive: Secondary | ICD-10-CM | POA: Diagnosis not present

## 2016-11-05 DIAGNOSIS — E46 Unspecified protein-calorie malnutrition: Secondary | ICD-10-CM | POA: Diagnosis not present

## 2016-11-05 DIAGNOSIS — Z85118 Personal history of other malignant neoplasm of bronchus and lung: Secondary | ICD-10-CM | POA: Diagnosis not present

## 2016-11-05 DIAGNOSIS — I4891 Unspecified atrial fibrillation: Secondary | ICD-10-CM | POA: Diagnosis not present

## 2016-11-05 DIAGNOSIS — I10 Essential (primary) hypertension: Secondary | ICD-10-CM | POA: Diagnosis not present

## 2016-11-05 DIAGNOSIS — J449 Chronic obstructive pulmonary disease, unspecified: Secondary | ICD-10-CM | POA: Diagnosis not present

## 2016-11-05 DIAGNOSIS — D649 Anemia, unspecified: Secondary | ICD-10-CM | POA: Diagnosis not present

## 2016-11-05 DIAGNOSIS — K219 Gastro-esophageal reflux disease without esophagitis: Secondary | ICD-10-CM | POA: Diagnosis not present

## 2016-11-05 DIAGNOSIS — Z8551 Personal history of malignant neoplasm of bladder: Secondary | ICD-10-CM | POA: Diagnosis not present

## 2016-11-06 DIAGNOSIS — D649 Anemia, unspecified: Secondary | ICD-10-CM | POA: Diagnosis not present

## 2016-11-06 DIAGNOSIS — Z85118 Personal history of other malignant neoplasm of bronchus and lung: Secondary | ICD-10-CM | POA: Diagnosis not present

## 2016-11-06 DIAGNOSIS — Z8551 Personal history of malignant neoplasm of bladder: Secondary | ICD-10-CM | POA: Diagnosis not present

## 2016-11-06 DIAGNOSIS — J449 Chronic obstructive pulmonary disease, unspecified: Secondary | ICD-10-CM | POA: Diagnosis not present

## 2016-11-06 DIAGNOSIS — G309 Alzheimer's disease, unspecified: Secondary | ICD-10-CM | POA: Diagnosis not present

## 2016-11-06 DIAGNOSIS — R627 Adult failure to thrive: Secondary | ICD-10-CM | POA: Diagnosis not present

## 2016-11-06 DIAGNOSIS — I10 Essential (primary) hypertension: Secondary | ICD-10-CM | POA: Diagnosis not present

## 2016-11-06 DIAGNOSIS — E119 Type 2 diabetes mellitus without complications: Secondary | ICD-10-CM | POA: Diagnosis not present

## 2016-11-06 DIAGNOSIS — K219 Gastro-esophageal reflux disease without esophagitis: Secondary | ICD-10-CM | POA: Diagnosis not present

## 2016-11-06 DIAGNOSIS — E039 Hypothyroidism, unspecified: Secondary | ICD-10-CM | POA: Diagnosis not present

## 2016-11-06 DIAGNOSIS — E46 Unspecified protein-calorie malnutrition: Secondary | ICD-10-CM | POA: Diagnosis not present

## 2016-11-06 DIAGNOSIS — I4891 Unspecified atrial fibrillation: Secondary | ICD-10-CM | POA: Diagnosis not present

## 2016-11-07 DIAGNOSIS — I10 Essential (primary) hypertension: Secondary | ICD-10-CM | POA: Diagnosis not present

## 2016-11-07 DIAGNOSIS — I4891 Unspecified atrial fibrillation: Secondary | ICD-10-CM | POA: Diagnosis not present

## 2016-11-07 DIAGNOSIS — K219 Gastro-esophageal reflux disease without esophagitis: Secondary | ICD-10-CM | POA: Diagnosis not present

## 2016-11-07 DIAGNOSIS — J449 Chronic obstructive pulmonary disease, unspecified: Secondary | ICD-10-CM | POA: Diagnosis not present

## 2016-11-07 DIAGNOSIS — D649 Anemia, unspecified: Secondary | ICD-10-CM | POA: Diagnosis not present

## 2016-11-07 DIAGNOSIS — E119 Type 2 diabetes mellitus without complications: Secondary | ICD-10-CM | POA: Diagnosis not present

## 2016-11-07 DIAGNOSIS — Z8551 Personal history of malignant neoplasm of bladder: Secondary | ICD-10-CM | POA: Diagnosis not present

## 2016-11-07 DIAGNOSIS — G309 Alzheimer's disease, unspecified: Secondary | ICD-10-CM | POA: Diagnosis not present

## 2016-11-07 DIAGNOSIS — R627 Adult failure to thrive: Secondary | ICD-10-CM | POA: Diagnosis not present

## 2016-11-07 DIAGNOSIS — E039 Hypothyroidism, unspecified: Secondary | ICD-10-CM | POA: Diagnosis not present

## 2016-11-07 DIAGNOSIS — E46 Unspecified protein-calorie malnutrition: Secondary | ICD-10-CM | POA: Diagnosis not present

## 2016-11-07 DIAGNOSIS — Z85118 Personal history of other malignant neoplasm of bronchus and lung: Secondary | ICD-10-CM | POA: Diagnosis not present

## 2016-11-08 ENCOUNTER — Other Ambulatory Visit
Admission: RE | Admit: 2016-11-08 | Discharge: 2016-11-08 | Disposition: A | Discharge status: Home / Self Care | Source: Ambulatory Visit | Attending: Internal Medicine | Admitting: Internal Medicine

## 2016-11-08 DIAGNOSIS — E039 Hypothyroidism, unspecified: Secondary | ICD-10-CM | POA: Diagnosis not present

## 2016-11-08 DIAGNOSIS — J449 Chronic obstructive pulmonary disease, unspecified: Secondary | ICD-10-CM | POA: Diagnosis not present

## 2016-11-08 DIAGNOSIS — E119 Type 2 diabetes mellitus without complications: Secondary | ICD-10-CM | POA: Diagnosis not present

## 2016-11-08 DIAGNOSIS — I10 Essential (primary) hypertension: Secondary | ICD-10-CM | POA: Diagnosis not present

## 2016-11-08 DIAGNOSIS — R350 Frequency of micturition: Secondary | ICD-10-CM | POA: Diagnosis not present

## 2016-11-08 DIAGNOSIS — R300 Dysuria: Secondary | ICD-10-CM | POA: Insufficient documentation

## 2016-11-08 DIAGNOSIS — R627 Adult failure to thrive: Secondary | ICD-10-CM | POA: Diagnosis not present

## 2016-11-08 DIAGNOSIS — R109 Unspecified abdominal pain: Secondary | ICD-10-CM | POA: Insufficient documentation

## 2016-11-08 DIAGNOSIS — Z85118 Personal history of other malignant neoplasm of bronchus and lung: Secondary | ICD-10-CM | POA: Diagnosis not present

## 2016-11-08 DIAGNOSIS — E46 Unspecified protein-calorie malnutrition: Secondary | ICD-10-CM | POA: Diagnosis not present

## 2016-11-08 DIAGNOSIS — K219 Gastro-esophageal reflux disease without esophagitis: Secondary | ICD-10-CM | POA: Diagnosis not present

## 2016-11-08 DIAGNOSIS — I4891 Unspecified atrial fibrillation: Secondary | ICD-10-CM | POA: Diagnosis not present

## 2016-11-08 DIAGNOSIS — D649 Anemia, unspecified: Secondary | ICD-10-CM | POA: Diagnosis not present

## 2016-11-08 DIAGNOSIS — G309 Alzheimer's disease, unspecified: Secondary | ICD-10-CM | POA: Diagnosis not present

## 2016-11-08 DIAGNOSIS — Z8551 Personal history of malignant neoplasm of bladder: Secondary | ICD-10-CM | POA: Diagnosis not present

## 2016-11-08 LAB — URINALYSIS, ROUTINE W REFLEX MICROSCOPIC
BILIRUBIN URINE: NEGATIVE
Glucose, UA: >=500 mg/dL — AB
Hgb urine dipstick: NEGATIVE
Ketones, ur: NEGATIVE mg/dL
NITRITE: NEGATIVE
Protein, ur: NEGATIVE mg/dL
SPECIFIC GRAVITY, URINE: 1.025 (ref 1.005–1.030)
pH: 5 (ref 5.0–8.0)

## 2016-11-09 DIAGNOSIS — Z8551 Personal history of malignant neoplasm of bladder: Secondary | ICD-10-CM | POA: Diagnosis not present

## 2016-11-09 DIAGNOSIS — I10 Essential (primary) hypertension: Secondary | ICD-10-CM | POA: Diagnosis not present

## 2016-11-09 DIAGNOSIS — R627 Adult failure to thrive: Secondary | ICD-10-CM | POA: Diagnosis not present

## 2016-11-09 DIAGNOSIS — E039 Hypothyroidism, unspecified: Secondary | ICD-10-CM | POA: Diagnosis not present

## 2016-11-09 DIAGNOSIS — E119 Type 2 diabetes mellitus without complications: Secondary | ICD-10-CM | POA: Diagnosis not present

## 2016-11-09 DIAGNOSIS — D649 Anemia, unspecified: Secondary | ICD-10-CM | POA: Diagnosis not present

## 2016-11-09 DIAGNOSIS — J449 Chronic obstructive pulmonary disease, unspecified: Secondary | ICD-10-CM | POA: Diagnosis not present

## 2016-11-09 DIAGNOSIS — K219 Gastro-esophageal reflux disease without esophagitis: Secondary | ICD-10-CM | POA: Diagnosis not present

## 2016-11-09 DIAGNOSIS — G309 Alzheimer's disease, unspecified: Secondary | ICD-10-CM | POA: Diagnosis not present

## 2016-11-09 DIAGNOSIS — Z85118 Personal history of other malignant neoplasm of bronchus and lung: Secondary | ICD-10-CM | POA: Diagnosis not present

## 2016-11-09 DIAGNOSIS — I4891 Unspecified atrial fibrillation: Secondary | ICD-10-CM | POA: Diagnosis not present

## 2016-11-09 DIAGNOSIS — E46 Unspecified protein-calorie malnutrition: Secondary | ICD-10-CM | POA: Diagnosis not present

## 2016-11-09 LAB — URINE CULTURE

## 2016-11-10 ENCOUNTER — Other Ambulatory Visit
Admission: RE | Admit: 2016-11-10 | Discharge: 2016-11-10 | Disposition: A | Discharge status: Home / Self Care | Source: Skilled Nursing Facility | Attending: Gerontology | Admitting: Gerontology

## 2016-11-10 DIAGNOSIS — R109 Unspecified abdominal pain: Secondary | ICD-10-CM | POA: Diagnosis not present

## 2016-11-10 DIAGNOSIS — E46 Unspecified protein-calorie malnutrition: Secondary | ICD-10-CM | POA: Diagnosis not present

## 2016-11-10 DIAGNOSIS — R300 Dysuria: Secondary | ICD-10-CM | POA: Insufficient documentation

## 2016-11-10 DIAGNOSIS — E119 Type 2 diabetes mellitus without complications: Secondary | ICD-10-CM | POA: Diagnosis not present

## 2016-11-10 DIAGNOSIS — R627 Adult failure to thrive: Secondary | ICD-10-CM | POA: Diagnosis not present

## 2016-11-10 DIAGNOSIS — I4891 Unspecified atrial fibrillation: Secondary | ICD-10-CM | POA: Diagnosis not present

## 2016-11-10 DIAGNOSIS — D649 Anemia, unspecified: Secondary | ICD-10-CM | POA: Diagnosis not present

## 2016-11-10 DIAGNOSIS — E039 Hypothyroidism, unspecified: Secondary | ICD-10-CM | POA: Diagnosis not present

## 2016-11-10 DIAGNOSIS — Z8551 Personal history of malignant neoplasm of bladder: Secondary | ICD-10-CM | POA: Diagnosis not present

## 2016-11-10 DIAGNOSIS — I10 Essential (primary) hypertension: Secondary | ICD-10-CM | POA: Diagnosis not present

## 2016-11-10 DIAGNOSIS — G309 Alzheimer's disease, unspecified: Secondary | ICD-10-CM | POA: Diagnosis not present

## 2016-11-10 DIAGNOSIS — K219 Gastro-esophageal reflux disease without esophagitis: Secondary | ICD-10-CM | POA: Diagnosis not present

## 2016-11-10 DIAGNOSIS — J449 Chronic obstructive pulmonary disease, unspecified: Secondary | ICD-10-CM | POA: Diagnosis not present

## 2016-11-10 DIAGNOSIS — R350 Frequency of micturition: Secondary | ICD-10-CM | POA: Diagnosis not present

## 2016-11-10 DIAGNOSIS — Z85118 Personal history of other malignant neoplasm of bronchus and lung: Secondary | ICD-10-CM | POA: Diagnosis not present

## 2016-11-10 LAB — URINALYSIS, COMPLETE (UACMP) WITH MICROSCOPIC
BILIRUBIN URINE: NEGATIVE
Glucose, UA: 50 mg/dL — AB
HGB URINE DIPSTICK: NEGATIVE
Ketones, ur: NEGATIVE mg/dL
LEUKOCYTES UA: NEGATIVE
NITRITE: NEGATIVE
PROTEIN: NEGATIVE mg/dL
Specific Gravity, Urine: 1.02 (ref 1.005–1.030)
pH: 7 (ref 5.0–8.0)

## 2016-11-11 DIAGNOSIS — G309 Alzheimer's disease, unspecified: Secondary | ICD-10-CM | POA: Diagnosis not present

## 2016-11-11 DIAGNOSIS — K219 Gastro-esophageal reflux disease without esophagitis: Secondary | ICD-10-CM | POA: Diagnosis not present

## 2016-11-11 DIAGNOSIS — D649 Anemia, unspecified: Secondary | ICD-10-CM | POA: Diagnosis not present

## 2016-11-11 DIAGNOSIS — E119 Type 2 diabetes mellitus without complications: Secondary | ICD-10-CM | POA: Diagnosis not present

## 2016-11-11 DIAGNOSIS — Z8551 Personal history of malignant neoplasm of bladder: Secondary | ICD-10-CM | POA: Diagnosis not present

## 2016-11-11 DIAGNOSIS — Z85118 Personal history of other malignant neoplasm of bronchus and lung: Secondary | ICD-10-CM | POA: Diagnosis not present

## 2016-11-11 DIAGNOSIS — R627 Adult failure to thrive: Secondary | ICD-10-CM | POA: Diagnosis not present

## 2016-11-11 DIAGNOSIS — I10 Essential (primary) hypertension: Secondary | ICD-10-CM | POA: Diagnosis not present

## 2016-11-11 DIAGNOSIS — E039 Hypothyroidism, unspecified: Secondary | ICD-10-CM | POA: Diagnosis not present

## 2016-11-11 DIAGNOSIS — J449 Chronic obstructive pulmonary disease, unspecified: Secondary | ICD-10-CM | POA: Diagnosis not present

## 2016-11-11 DIAGNOSIS — E46 Unspecified protein-calorie malnutrition: Secondary | ICD-10-CM | POA: Diagnosis not present

## 2016-11-11 DIAGNOSIS — I4891 Unspecified atrial fibrillation: Secondary | ICD-10-CM | POA: Diagnosis not present

## 2016-11-11 LAB — URINE CULTURE

## 2016-11-12 DIAGNOSIS — E46 Unspecified protein-calorie malnutrition: Secondary | ICD-10-CM | POA: Diagnosis not present

## 2016-11-12 DIAGNOSIS — Z85118 Personal history of other malignant neoplasm of bronchus and lung: Secondary | ICD-10-CM | POA: Diagnosis not present

## 2016-11-12 DIAGNOSIS — D649 Anemia, unspecified: Secondary | ICD-10-CM | POA: Diagnosis not present

## 2016-11-12 DIAGNOSIS — I10 Essential (primary) hypertension: Secondary | ICD-10-CM | POA: Diagnosis not present

## 2016-11-12 DIAGNOSIS — I4891 Unspecified atrial fibrillation: Secondary | ICD-10-CM | POA: Diagnosis not present

## 2016-11-12 DIAGNOSIS — J449 Chronic obstructive pulmonary disease, unspecified: Secondary | ICD-10-CM | POA: Diagnosis not present

## 2016-11-12 DIAGNOSIS — Z8551 Personal history of malignant neoplasm of bladder: Secondary | ICD-10-CM | POA: Diagnosis not present

## 2016-11-12 DIAGNOSIS — K219 Gastro-esophageal reflux disease without esophagitis: Secondary | ICD-10-CM | POA: Diagnosis not present

## 2016-11-12 DIAGNOSIS — G309 Alzheimer's disease, unspecified: Secondary | ICD-10-CM | POA: Diagnosis not present

## 2016-11-12 DIAGNOSIS — R627 Adult failure to thrive: Secondary | ICD-10-CM | POA: Diagnosis not present

## 2016-11-12 DIAGNOSIS — E039 Hypothyroidism, unspecified: Secondary | ICD-10-CM | POA: Diagnosis not present

## 2016-11-12 DIAGNOSIS — E119 Type 2 diabetes mellitus without complications: Secondary | ICD-10-CM | POA: Diagnosis not present

## 2016-11-13 DIAGNOSIS — D649 Anemia, unspecified: Secondary | ICD-10-CM | POA: Diagnosis not present

## 2016-11-13 DIAGNOSIS — G309 Alzheimer's disease, unspecified: Secondary | ICD-10-CM | POA: Diagnosis not present

## 2016-11-13 DIAGNOSIS — Z8551 Personal history of malignant neoplasm of bladder: Secondary | ICD-10-CM | POA: Diagnosis not present

## 2016-11-13 DIAGNOSIS — E119 Type 2 diabetes mellitus without complications: Secondary | ICD-10-CM | POA: Diagnosis not present

## 2016-11-13 DIAGNOSIS — I10 Essential (primary) hypertension: Secondary | ICD-10-CM | POA: Diagnosis not present

## 2016-11-13 DIAGNOSIS — I4891 Unspecified atrial fibrillation: Secondary | ICD-10-CM | POA: Diagnosis not present

## 2016-11-13 DIAGNOSIS — R627 Adult failure to thrive: Secondary | ICD-10-CM | POA: Diagnosis not present

## 2016-11-13 DIAGNOSIS — K219 Gastro-esophageal reflux disease without esophagitis: Secondary | ICD-10-CM | POA: Diagnosis not present

## 2016-11-13 DIAGNOSIS — E46 Unspecified protein-calorie malnutrition: Secondary | ICD-10-CM | POA: Diagnosis not present

## 2016-11-13 DIAGNOSIS — J449 Chronic obstructive pulmonary disease, unspecified: Secondary | ICD-10-CM | POA: Diagnosis not present

## 2016-11-13 DIAGNOSIS — Z85118 Personal history of other malignant neoplasm of bronchus and lung: Secondary | ICD-10-CM | POA: Diagnosis not present

## 2016-11-13 DIAGNOSIS — E039 Hypothyroidism, unspecified: Secondary | ICD-10-CM | POA: Diagnosis not present

## 2016-11-14 DIAGNOSIS — D649 Anemia, unspecified: Secondary | ICD-10-CM | POA: Diagnosis not present

## 2016-11-14 DIAGNOSIS — Z8551 Personal history of malignant neoplasm of bladder: Secondary | ICD-10-CM | POA: Diagnosis not present

## 2016-11-14 DIAGNOSIS — K219 Gastro-esophageal reflux disease without esophagitis: Secondary | ICD-10-CM | POA: Diagnosis not present

## 2016-11-14 DIAGNOSIS — E039 Hypothyroidism, unspecified: Secondary | ICD-10-CM | POA: Diagnosis not present

## 2016-11-14 DIAGNOSIS — I4891 Unspecified atrial fibrillation: Secondary | ICD-10-CM | POA: Diagnosis not present

## 2016-11-14 DIAGNOSIS — I10 Essential (primary) hypertension: Secondary | ICD-10-CM | POA: Diagnosis not present

## 2016-11-14 DIAGNOSIS — J449 Chronic obstructive pulmonary disease, unspecified: Secondary | ICD-10-CM | POA: Diagnosis not present

## 2016-11-14 DIAGNOSIS — Z85118 Personal history of other malignant neoplasm of bronchus and lung: Secondary | ICD-10-CM | POA: Diagnosis not present

## 2016-11-14 DIAGNOSIS — R627 Adult failure to thrive: Secondary | ICD-10-CM | POA: Diagnosis not present

## 2016-11-14 DIAGNOSIS — E46 Unspecified protein-calorie malnutrition: Secondary | ICD-10-CM | POA: Diagnosis not present

## 2016-11-14 DIAGNOSIS — E119 Type 2 diabetes mellitus without complications: Secondary | ICD-10-CM | POA: Diagnosis not present

## 2016-11-14 DIAGNOSIS — G309 Alzheimer's disease, unspecified: Secondary | ICD-10-CM | POA: Diagnosis not present

## 2016-11-15 DIAGNOSIS — R627 Adult failure to thrive: Secondary | ICD-10-CM | POA: Diagnosis not present

## 2016-11-15 DIAGNOSIS — Z8551 Personal history of malignant neoplasm of bladder: Secondary | ICD-10-CM | POA: Diagnosis not present

## 2016-11-15 DIAGNOSIS — I10 Essential (primary) hypertension: Secondary | ICD-10-CM | POA: Diagnosis not present

## 2016-11-15 DIAGNOSIS — E46 Unspecified protein-calorie malnutrition: Secondary | ICD-10-CM | POA: Diagnosis not present

## 2016-11-15 DIAGNOSIS — J449 Chronic obstructive pulmonary disease, unspecified: Secondary | ICD-10-CM | POA: Diagnosis not present

## 2016-11-15 DIAGNOSIS — E119 Type 2 diabetes mellitus without complications: Secondary | ICD-10-CM | POA: Diagnosis not present

## 2016-11-15 DIAGNOSIS — G309 Alzheimer's disease, unspecified: Secondary | ICD-10-CM | POA: Diagnosis not present

## 2016-11-15 DIAGNOSIS — D649 Anemia, unspecified: Secondary | ICD-10-CM | POA: Diagnosis not present

## 2016-11-15 DIAGNOSIS — I4891 Unspecified atrial fibrillation: Secondary | ICD-10-CM | POA: Diagnosis not present

## 2016-11-15 DIAGNOSIS — K219 Gastro-esophageal reflux disease without esophagitis: Secondary | ICD-10-CM | POA: Diagnosis not present

## 2016-11-15 DIAGNOSIS — E039 Hypothyroidism, unspecified: Secondary | ICD-10-CM | POA: Diagnosis not present

## 2016-11-15 DIAGNOSIS — Z85118 Personal history of other malignant neoplasm of bronchus and lung: Secondary | ICD-10-CM | POA: Diagnosis not present

## 2016-11-16 DIAGNOSIS — E119 Type 2 diabetes mellitus without complications: Secondary | ICD-10-CM | POA: Diagnosis not present

## 2016-11-16 DIAGNOSIS — E039 Hypothyroidism, unspecified: Secondary | ICD-10-CM | POA: Diagnosis not present

## 2016-11-16 DIAGNOSIS — Z8551 Personal history of malignant neoplasm of bladder: Secondary | ICD-10-CM | POA: Diagnosis not present

## 2016-11-16 DIAGNOSIS — E46 Unspecified protein-calorie malnutrition: Secondary | ICD-10-CM | POA: Diagnosis not present

## 2016-11-16 DIAGNOSIS — D649 Anemia, unspecified: Secondary | ICD-10-CM | POA: Diagnosis not present

## 2016-11-16 DIAGNOSIS — G309 Alzheimer's disease, unspecified: Secondary | ICD-10-CM | POA: Diagnosis not present

## 2016-11-16 DIAGNOSIS — J449 Chronic obstructive pulmonary disease, unspecified: Secondary | ICD-10-CM | POA: Diagnosis not present

## 2016-11-16 DIAGNOSIS — R627 Adult failure to thrive: Secondary | ICD-10-CM | POA: Diagnosis not present

## 2016-11-16 DIAGNOSIS — Z85118 Personal history of other malignant neoplasm of bronchus and lung: Secondary | ICD-10-CM | POA: Diagnosis not present

## 2016-11-16 DIAGNOSIS — I4891 Unspecified atrial fibrillation: Secondary | ICD-10-CM | POA: Diagnosis not present

## 2016-11-16 DIAGNOSIS — K219 Gastro-esophageal reflux disease without esophagitis: Secondary | ICD-10-CM | POA: Diagnosis not present

## 2016-11-16 DIAGNOSIS — I10 Essential (primary) hypertension: Secondary | ICD-10-CM | POA: Diagnosis not present

## 2016-11-17 DIAGNOSIS — Z85118 Personal history of other malignant neoplasm of bronchus and lung: Secondary | ICD-10-CM | POA: Diagnosis not present

## 2016-11-17 DIAGNOSIS — E039 Hypothyroidism, unspecified: Secondary | ICD-10-CM | POA: Diagnosis not present

## 2016-11-17 DIAGNOSIS — I10 Essential (primary) hypertension: Secondary | ICD-10-CM | POA: Diagnosis not present

## 2016-11-17 DIAGNOSIS — G309 Alzheimer's disease, unspecified: Secondary | ICD-10-CM | POA: Diagnosis not present

## 2016-11-17 DIAGNOSIS — E119 Type 2 diabetes mellitus without complications: Secondary | ICD-10-CM | POA: Diagnosis not present

## 2016-11-17 DIAGNOSIS — I4891 Unspecified atrial fibrillation: Secondary | ICD-10-CM | POA: Diagnosis not present

## 2016-11-17 DIAGNOSIS — J449 Chronic obstructive pulmonary disease, unspecified: Secondary | ICD-10-CM | POA: Diagnosis not present

## 2016-11-17 DIAGNOSIS — K219 Gastro-esophageal reflux disease without esophagitis: Secondary | ICD-10-CM | POA: Diagnosis not present

## 2016-11-17 DIAGNOSIS — D649 Anemia, unspecified: Secondary | ICD-10-CM | POA: Diagnosis not present

## 2016-11-17 DIAGNOSIS — E46 Unspecified protein-calorie malnutrition: Secondary | ICD-10-CM | POA: Diagnosis not present

## 2016-11-17 DIAGNOSIS — R627 Adult failure to thrive: Secondary | ICD-10-CM | POA: Diagnosis not present

## 2016-11-17 DIAGNOSIS — Z8551 Personal history of malignant neoplasm of bladder: Secondary | ICD-10-CM | POA: Diagnosis not present

## 2016-11-18 DIAGNOSIS — J449 Chronic obstructive pulmonary disease, unspecified: Secondary | ICD-10-CM | POA: Diagnosis not present

## 2016-11-18 DIAGNOSIS — E119 Type 2 diabetes mellitus without complications: Secondary | ICD-10-CM | POA: Diagnosis not present

## 2016-11-18 DIAGNOSIS — I10 Essential (primary) hypertension: Secondary | ICD-10-CM | POA: Diagnosis not present

## 2016-11-18 DIAGNOSIS — E039 Hypothyroidism, unspecified: Secondary | ICD-10-CM | POA: Diagnosis not present

## 2016-11-18 DIAGNOSIS — I4891 Unspecified atrial fibrillation: Secondary | ICD-10-CM | POA: Diagnosis not present

## 2016-11-18 DIAGNOSIS — Z85118 Personal history of other malignant neoplasm of bronchus and lung: Secondary | ICD-10-CM | POA: Diagnosis not present

## 2016-11-18 DIAGNOSIS — E46 Unspecified protein-calorie malnutrition: Secondary | ICD-10-CM | POA: Diagnosis not present

## 2016-11-18 DIAGNOSIS — K219 Gastro-esophageal reflux disease without esophagitis: Secondary | ICD-10-CM | POA: Diagnosis not present

## 2016-11-18 DIAGNOSIS — D649 Anemia, unspecified: Secondary | ICD-10-CM | POA: Diagnosis not present

## 2016-11-18 DIAGNOSIS — G309 Alzheimer's disease, unspecified: Secondary | ICD-10-CM | POA: Diagnosis not present

## 2016-11-18 DIAGNOSIS — R627 Adult failure to thrive: Secondary | ICD-10-CM | POA: Diagnosis not present

## 2016-11-18 DIAGNOSIS — Z8551 Personal history of malignant neoplasm of bladder: Secondary | ICD-10-CM | POA: Diagnosis not present

## 2016-11-19 ENCOUNTER — Non-Acute Institutional Stay (SKILLED_NURSING_FACILITY): Payer: Medicare Other | Admitting: Gerontology

## 2016-11-19 ENCOUNTER — Encounter: Payer: Self-pay | Admitting: Gerontology

## 2016-11-19 DIAGNOSIS — M1611 Unilateral primary osteoarthritis, right hip: Secondary | ICD-10-CM

## 2016-11-19 DIAGNOSIS — F0280 Dementia in other diseases classified elsewhere without behavioral disturbance: Secondary | ICD-10-CM | POA: Diagnosis not present

## 2016-11-19 DIAGNOSIS — Z9181 History of falling: Secondary | ICD-10-CM | POA: Diagnosis not present

## 2016-11-19 DIAGNOSIS — E7800 Pure hypercholesterolemia, unspecified: Secondary | ICD-10-CM

## 2016-11-19 DIAGNOSIS — R609 Edema, unspecified: Secondary | ICD-10-CM

## 2016-11-19 DIAGNOSIS — K5900 Constipation, unspecified: Secondary | ICD-10-CM

## 2016-11-19 DIAGNOSIS — Z8551 Personal history of malignant neoplasm of bladder: Secondary | ICD-10-CM | POA: Diagnosis not present

## 2016-11-19 DIAGNOSIS — K219 Gastro-esophageal reflux disease without esophagitis: Secondary | ICD-10-CM | POA: Diagnosis not present

## 2016-11-19 DIAGNOSIS — G301 Alzheimer's disease with late onset: Secondary | ICD-10-CM | POA: Diagnosis not present

## 2016-11-19 DIAGNOSIS — R627 Adult failure to thrive: Secondary | ICD-10-CM | POA: Diagnosis not present

## 2016-11-19 DIAGNOSIS — G4700 Insomnia, unspecified: Secondary | ICD-10-CM

## 2016-11-19 DIAGNOSIS — I4891 Unspecified atrial fibrillation: Secondary | ICD-10-CM | POA: Diagnosis not present

## 2016-11-19 DIAGNOSIS — J309 Allergic rhinitis, unspecified: Secondary | ICD-10-CM

## 2016-11-19 DIAGNOSIS — J449 Chronic obstructive pulmonary disease, unspecified: Secondary | ICD-10-CM

## 2016-11-19 DIAGNOSIS — E039 Hypothyroidism, unspecified: Secondary | ICD-10-CM

## 2016-11-19 DIAGNOSIS — Z85118 Personal history of other malignant neoplasm of bronchus and lung: Secondary | ICD-10-CM | POA: Diagnosis not present

## 2016-11-19 DIAGNOSIS — I10 Essential (primary) hypertension: Secondary | ICD-10-CM

## 2016-11-19 DIAGNOSIS — N289 Disorder of kidney and ureter, unspecified: Secondary | ICD-10-CM

## 2016-11-19 DIAGNOSIS — E119 Type 2 diabetes mellitus without complications: Secondary | ICD-10-CM | POA: Diagnosis not present

## 2016-11-19 DIAGNOSIS — E46 Unspecified protein-calorie malnutrition: Secondary | ICD-10-CM | POA: Diagnosis not present

## 2016-11-19 DIAGNOSIS — D649 Anemia, unspecified: Secondary | ICD-10-CM

## 2016-11-19 DIAGNOSIS — T148XXA Other injury of unspecified body region, initial encounter: Secondary | ICD-10-CM

## 2016-11-19 DIAGNOSIS — G309 Alzheimer's disease, unspecified: Secondary | ICD-10-CM | POA: Diagnosis not present

## 2016-11-19 DIAGNOSIS — D510 Vitamin B12 deficiency anemia due to intrinsic factor deficiency: Secondary | ICD-10-CM

## 2016-11-19 DIAGNOSIS — G2581 Restless legs syndrome: Secondary | ICD-10-CM

## 2016-11-19 DIAGNOSIS — F29 Unspecified psychosis not due to a substance or known physiological condition: Secondary | ICD-10-CM

## 2016-11-19 DIAGNOSIS — R1312 Dysphagia, oropharyngeal phase: Secondary | ICD-10-CM

## 2016-11-19 DIAGNOSIS — R41841 Cognitive communication deficit: Secondary | ICD-10-CM

## 2016-11-20 DIAGNOSIS — I10 Essential (primary) hypertension: Secondary | ICD-10-CM | POA: Diagnosis not present

## 2016-11-20 DIAGNOSIS — D649 Anemia, unspecified: Secondary | ICD-10-CM | POA: Diagnosis not present

## 2016-11-20 DIAGNOSIS — E46 Unspecified protein-calorie malnutrition: Secondary | ICD-10-CM | POA: Diagnosis not present

## 2016-11-20 DIAGNOSIS — I4891 Unspecified atrial fibrillation: Secondary | ICD-10-CM | POA: Diagnosis not present

## 2016-11-20 DIAGNOSIS — E039 Hypothyroidism, unspecified: Secondary | ICD-10-CM | POA: Diagnosis not present

## 2016-11-20 DIAGNOSIS — J449 Chronic obstructive pulmonary disease, unspecified: Secondary | ICD-10-CM | POA: Diagnosis not present

## 2016-11-20 DIAGNOSIS — Z85118 Personal history of other malignant neoplasm of bronchus and lung: Secondary | ICD-10-CM | POA: Diagnosis not present

## 2016-11-20 DIAGNOSIS — K219 Gastro-esophageal reflux disease without esophagitis: Secondary | ICD-10-CM | POA: Diagnosis not present

## 2016-11-20 DIAGNOSIS — E119 Type 2 diabetes mellitus without complications: Secondary | ICD-10-CM | POA: Diagnosis not present

## 2016-11-20 DIAGNOSIS — G309 Alzheimer's disease, unspecified: Secondary | ICD-10-CM | POA: Diagnosis not present

## 2016-11-20 DIAGNOSIS — Z8551 Personal history of malignant neoplasm of bladder: Secondary | ICD-10-CM | POA: Diagnosis not present

## 2016-11-20 DIAGNOSIS — R627 Adult failure to thrive: Secondary | ICD-10-CM | POA: Diagnosis not present

## 2016-11-21 DIAGNOSIS — I4891 Unspecified atrial fibrillation: Secondary | ICD-10-CM | POA: Diagnosis not present

## 2016-11-21 DIAGNOSIS — J449 Chronic obstructive pulmonary disease, unspecified: Secondary | ICD-10-CM | POA: Diagnosis not present

## 2016-11-21 DIAGNOSIS — R627 Adult failure to thrive: Secondary | ICD-10-CM | POA: Diagnosis not present

## 2016-11-21 DIAGNOSIS — Z8551 Personal history of malignant neoplasm of bladder: Secondary | ICD-10-CM | POA: Diagnosis not present

## 2016-11-21 DIAGNOSIS — E039 Hypothyroidism, unspecified: Secondary | ICD-10-CM | POA: Diagnosis not present

## 2016-11-21 DIAGNOSIS — K219 Gastro-esophageal reflux disease without esophagitis: Secondary | ICD-10-CM | POA: Diagnosis not present

## 2016-11-21 DIAGNOSIS — E119 Type 2 diabetes mellitus without complications: Secondary | ICD-10-CM | POA: Diagnosis not present

## 2016-11-21 DIAGNOSIS — Z85118 Personal history of other malignant neoplasm of bronchus and lung: Secondary | ICD-10-CM | POA: Diagnosis not present

## 2016-11-21 DIAGNOSIS — I10 Essential (primary) hypertension: Secondary | ICD-10-CM | POA: Diagnosis not present

## 2016-11-21 DIAGNOSIS — G309 Alzheimer's disease, unspecified: Secondary | ICD-10-CM | POA: Diagnosis not present

## 2016-11-21 DIAGNOSIS — E46 Unspecified protein-calorie malnutrition: Secondary | ICD-10-CM | POA: Diagnosis not present

## 2016-11-21 DIAGNOSIS — D649 Anemia, unspecified: Secondary | ICD-10-CM | POA: Diagnosis not present

## 2016-11-22 ENCOUNTER — Encounter
Admission: RE | Admit: 2016-11-22 | Discharge: 2016-11-22 | Disposition: A | Discharge status: Home / Self Care | Payer: Medicare Other | Source: Ambulatory Visit | Attending: Internal Medicine | Admitting: Internal Medicine

## 2016-11-22 DIAGNOSIS — E119 Type 2 diabetes mellitus without complications: Secondary | ICD-10-CM | POA: Diagnosis not present

## 2016-11-22 DIAGNOSIS — E46 Unspecified protein-calorie malnutrition: Secondary | ICD-10-CM | POA: Diagnosis not present

## 2016-11-22 DIAGNOSIS — D649 Anemia, unspecified: Secondary | ICD-10-CM | POA: Diagnosis not present

## 2016-11-22 DIAGNOSIS — Z8551 Personal history of malignant neoplasm of bladder: Secondary | ICD-10-CM | POA: Diagnosis not present

## 2016-11-22 DIAGNOSIS — I10 Essential (primary) hypertension: Secondary | ICD-10-CM | POA: Diagnosis not present

## 2016-11-22 DIAGNOSIS — E039 Hypothyroidism, unspecified: Secondary | ICD-10-CM | POA: Diagnosis not present

## 2016-11-22 DIAGNOSIS — I4891 Unspecified atrial fibrillation: Secondary | ICD-10-CM | POA: Diagnosis not present

## 2016-11-22 DIAGNOSIS — G309 Alzheimer's disease, unspecified: Secondary | ICD-10-CM | POA: Diagnosis not present

## 2016-11-22 DIAGNOSIS — K219 Gastro-esophageal reflux disease without esophagitis: Secondary | ICD-10-CM | POA: Diagnosis not present

## 2016-11-22 DIAGNOSIS — R627 Adult failure to thrive: Secondary | ICD-10-CM | POA: Diagnosis not present

## 2016-11-22 DIAGNOSIS — J449 Chronic obstructive pulmonary disease, unspecified: Secondary | ICD-10-CM | POA: Diagnosis not present

## 2016-11-22 DIAGNOSIS — Z85118 Personal history of other malignant neoplasm of bronchus and lung: Secondary | ICD-10-CM | POA: Diagnosis not present

## 2016-11-23 DIAGNOSIS — E46 Unspecified protein-calorie malnutrition: Secondary | ICD-10-CM | POA: Diagnosis not present

## 2016-11-23 DIAGNOSIS — D649 Anemia, unspecified: Secondary | ICD-10-CM | POA: Diagnosis not present

## 2016-11-23 DIAGNOSIS — I10 Essential (primary) hypertension: Secondary | ICD-10-CM | POA: Diagnosis not present

## 2016-11-23 DIAGNOSIS — I4891 Unspecified atrial fibrillation: Secondary | ICD-10-CM | POA: Diagnosis not present

## 2016-11-23 DIAGNOSIS — Z85118 Personal history of other malignant neoplasm of bronchus and lung: Secondary | ICD-10-CM | POA: Diagnosis not present

## 2016-11-23 DIAGNOSIS — J449 Chronic obstructive pulmonary disease, unspecified: Secondary | ICD-10-CM | POA: Diagnosis not present

## 2016-11-23 DIAGNOSIS — Z8551 Personal history of malignant neoplasm of bladder: Secondary | ICD-10-CM | POA: Diagnosis not present

## 2016-11-23 DIAGNOSIS — E119 Type 2 diabetes mellitus without complications: Secondary | ICD-10-CM | POA: Diagnosis not present

## 2016-11-23 DIAGNOSIS — K219 Gastro-esophageal reflux disease without esophagitis: Secondary | ICD-10-CM | POA: Diagnosis not present

## 2016-11-23 DIAGNOSIS — G309 Alzheimer's disease, unspecified: Secondary | ICD-10-CM | POA: Diagnosis not present

## 2016-11-23 DIAGNOSIS — R627 Adult failure to thrive: Secondary | ICD-10-CM | POA: Diagnosis not present

## 2016-11-23 DIAGNOSIS — E039 Hypothyroidism, unspecified: Secondary | ICD-10-CM | POA: Diagnosis not present

## 2016-11-24 DIAGNOSIS — G309 Alzheimer's disease, unspecified: Secondary | ICD-10-CM | POA: Diagnosis not present

## 2016-11-24 DIAGNOSIS — I4891 Unspecified atrial fibrillation: Secondary | ICD-10-CM | POA: Diagnosis not present

## 2016-11-24 DIAGNOSIS — I10 Essential (primary) hypertension: Secondary | ICD-10-CM | POA: Diagnosis not present

## 2016-11-24 DIAGNOSIS — Z85118 Personal history of other malignant neoplasm of bronchus and lung: Secondary | ICD-10-CM | POA: Diagnosis not present

## 2016-11-24 DIAGNOSIS — J449 Chronic obstructive pulmonary disease, unspecified: Secondary | ICD-10-CM | POA: Diagnosis not present

## 2016-11-24 DIAGNOSIS — R627 Adult failure to thrive: Secondary | ICD-10-CM | POA: Diagnosis not present

## 2016-11-24 DIAGNOSIS — Z8551 Personal history of malignant neoplasm of bladder: Secondary | ICD-10-CM | POA: Diagnosis not present

## 2016-11-24 DIAGNOSIS — E119 Type 2 diabetes mellitus without complications: Secondary | ICD-10-CM | POA: Diagnosis not present

## 2016-11-24 DIAGNOSIS — D649 Anemia, unspecified: Secondary | ICD-10-CM | POA: Diagnosis not present

## 2016-11-24 DIAGNOSIS — E46 Unspecified protein-calorie malnutrition: Secondary | ICD-10-CM | POA: Diagnosis not present

## 2016-11-24 DIAGNOSIS — E039 Hypothyroidism, unspecified: Secondary | ICD-10-CM | POA: Diagnosis not present

## 2016-11-24 DIAGNOSIS — K219 Gastro-esophageal reflux disease without esophagitis: Secondary | ICD-10-CM | POA: Diagnosis not present

## 2016-11-25 DIAGNOSIS — D649 Anemia, unspecified: Secondary | ICD-10-CM | POA: Diagnosis not present

## 2016-11-25 DIAGNOSIS — J449 Chronic obstructive pulmonary disease, unspecified: Secondary | ICD-10-CM | POA: Diagnosis not present

## 2016-11-25 DIAGNOSIS — E46 Unspecified protein-calorie malnutrition: Secondary | ICD-10-CM | POA: Diagnosis not present

## 2016-11-25 DIAGNOSIS — G309 Alzheimer's disease, unspecified: Secondary | ICD-10-CM | POA: Diagnosis not present

## 2016-11-25 DIAGNOSIS — K219 Gastro-esophageal reflux disease without esophagitis: Secondary | ICD-10-CM | POA: Diagnosis not present

## 2016-11-25 DIAGNOSIS — E039 Hypothyroidism, unspecified: Secondary | ICD-10-CM | POA: Diagnosis not present

## 2016-11-25 DIAGNOSIS — Z85118 Personal history of other malignant neoplasm of bronchus and lung: Secondary | ICD-10-CM | POA: Diagnosis not present

## 2016-11-25 DIAGNOSIS — I10 Essential (primary) hypertension: Secondary | ICD-10-CM | POA: Diagnosis not present

## 2016-11-25 DIAGNOSIS — Z8551 Personal history of malignant neoplasm of bladder: Secondary | ICD-10-CM | POA: Diagnosis not present

## 2016-11-25 DIAGNOSIS — R627 Adult failure to thrive: Secondary | ICD-10-CM | POA: Diagnosis not present

## 2016-11-25 DIAGNOSIS — I4891 Unspecified atrial fibrillation: Secondary | ICD-10-CM | POA: Diagnosis not present

## 2016-11-25 DIAGNOSIS — E119 Type 2 diabetes mellitus without complications: Secondary | ICD-10-CM | POA: Diagnosis not present

## 2016-11-26 DIAGNOSIS — D649 Anemia, unspecified: Secondary | ICD-10-CM | POA: Diagnosis not present

## 2016-11-26 DIAGNOSIS — E039 Hypothyroidism, unspecified: Secondary | ICD-10-CM | POA: Diagnosis not present

## 2016-11-26 DIAGNOSIS — R627 Adult failure to thrive: Secondary | ICD-10-CM | POA: Diagnosis not present

## 2016-11-26 DIAGNOSIS — E46 Unspecified protein-calorie malnutrition: Secondary | ICD-10-CM | POA: Diagnosis not present

## 2016-11-26 DIAGNOSIS — I4891 Unspecified atrial fibrillation: Secondary | ICD-10-CM | POA: Diagnosis not present

## 2016-11-26 DIAGNOSIS — Z85118 Personal history of other malignant neoplasm of bronchus and lung: Secondary | ICD-10-CM | POA: Diagnosis not present

## 2016-11-26 DIAGNOSIS — K219 Gastro-esophageal reflux disease without esophagitis: Secondary | ICD-10-CM | POA: Diagnosis not present

## 2016-11-26 DIAGNOSIS — E119 Type 2 diabetes mellitus without complications: Secondary | ICD-10-CM | POA: Diagnosis not present

## 2016-11-26 DIAGNOSIS — Z8551 Personal history of malignant neoplasm of bladder: Secondary | ICD-10-CM | POA: Diagnosis not present

## 2016-11-26 DIAGNOSIS — I10 Essential (primary) hypertension: Secondary | ICD-10-CM | POA: Diagnosis not present

## 2016-11-26 DIAGNOSIS — J449 Chronic obstructive pulmonary disease, unspecified: Secondary | ICD-10-CM | POA: Diagnosis not present

## 2016-11-26 DIAGNOSIS — G309 Alzheimer's disease, unspecified: Secondary | ICD-10-CM | POA: Diagnosis not present

## 2016-11-27 DIAGNOSIS — I10 Essential (primary) hypertension: Secondary | ICD-10-CM | POA: Diagnosis not present

## 2016-11-27 DIAGNOSIS — E119 Type 2 diabetes mellitus without complications: Secondary | ICD-10-CM | POA: Diagnosis not present

## 2016-11-27 DIAGNOSIS — K219 Gastro-esophageal reflux disease without esophagitis: Secondary | ICD-10-CM | POA: Diagnosis not present

## 2016-11-27 DIAGNOSIS — Z85118 Personal history of other malignant neoplasm of bronchus and lung: Secondary | ICD-10-CM | POA: Diagnosis not present

## 2016-11-27 DIAGNOSIS — E46 Unspecified protein-calorie malnutrition: Secondary | ICD-10-CM | POA: Diagnosis not present

## 2016-11-27 DIAGNOSIS — R627 Adult failure to thrive: Secondary | ICD-10-CM | POA: Diagnosis not present

## 2016-11-27 DIAGNOSIS — E039 Hypothyroidism, unspecified: Secondary | ICD-10-CM | POA: Diagnosis not present

## 2016-11-27 DIAGNOSIS — D649 Anemia, unspecified: Secondary | ICD-10-CM | POA: Diagnosis not present

## 2016-11-27 DIAGNOSIS — I4891 Unspecified atrial fibrillation: Secondary | ICD-10-CM | POA: Diagnosis not present

## 2016-11-27 DIAGNOSIS — G309 Alzheimer's disease, unspecified: Secondary | ICD-10-CM | POA: Diagnosis not present

## 2016-11-27 DIAGNOSIS — J449 Chronic obstructive pulmonary disease, unspecified: Secondary | ICD-10-CM | POA: Diagnosis not present

## 2016-11-27 DIAGNOSIS — Z8551 Personal history of malignant neoplasm of bladder: Secondary | ICD-10-CM | POA: Diagnosis not present

## 2016-11-28 DIAGNOSIS — J449 Chronic obstructive pulmonary disease, unspecified: Secondary | ICD-10-CM | POA: Diagnosis not present

## 2016-11-28 DIAGNOSIS — E039 Hypothyroidism, unspecified: Secondary | ICD-10-CM | POA: Diagnosis not present

## 2016-11-28 DIAGNOSIS — I4891 Unspecified atrial fibrillation: Secondary | ICD-10-CM | POA: Diagnosis not present

## 2016-11-28 DIAGNOSIS — R627 Adult failure to thrive: Secondary | ICD-10-CM | POA: Diagnosis not present

## 2016-11-28 DIAGNOSIS — Z85118 Personal history of other malignant neoplasm of bronchus and lung: Secondary | ICD-10-CM | POA: Diagnosis not present

## 2016-11-28 DIAGNOSIS — I10 Essential (primary) hypertension: Secondary | ICD-10-CM | POA: Diagnosis not present

## 2016-11-28 DIAGNOSIS — K219 Gastro-esophageal reflux disease without esophagitis: Secondary | ICD-10-CM | POA: Diagnosis not present

## 2016-11-28 DIAGNOSIS — E119 Type 2 diabetes mellitus without complications: Secondary | ICD-10-CM | POA: Diagnosis not present

## 2016-11-28 DIAGNOSIS — Z8551 Personal history of malignant neoplasm of bladder: Secondary | ICD-10-CM | POA: Diagnosis not present

## 2016-11-28 DIAGNOSIS — D649 Anemia, unspecified: Secondary | ICD-10-CM | POA: Diagnosis not present

## 2016-11-28 DIAGNOSIS — G309 Alzheimer's disease, unspecified: Secondary | ICD-10-CM | POA: Diagnosis not present

## 2016-11-28 DIAGNOSIS — E46 Unspecified protein-calorie malnutrition: Secondary | ICD-10-CM | POA: Diagnosis not present

## 2016-11-29 DIAGNOSIS — E119 Type 2 diabetes mellitus without complications: Secondary | ICD-10-CM | POA: Diagnosis not present

## 2016-11-29 DIAGNOSIS — Z8551 Personal history of malignant neoplasm of bladder: Secondary | ICD-10-CM | POA: Diagnosis not present

## 2016-11-29 DIAGNOSIS — R627 Adult failure to thrive: Secondary | ICD-10-CM | POA: Diagnosis not present

## 2016-11-29 DIAGNOSIS — E039 Hypothyroidism, unspecified: Secondary | ICD-10-CM | POA: Diagnosis not present

## 2016-11-29 DIAGNOSIS — E46 Unspecified protein-calorie malnutrition: Secondary | ICD-10-CM | POA: Diagnosis not present

## 2016-11-29 DIAGNOSIS — J449 Chronic obstructive pulmonary disease, unspecified: Secondary | ICD-10-CM | POA: Diagnosis not present

## 2016-11-29 DIAGNOSIS — K219 Gastro-esophageal reflux disease without esophagitis: Secondary | ICD-10-CM | POA: Diagnosis not present

## 2016-11-29 DIAGNOSIS — Z85118 Personal history of other malignant neoplasm of bronchus and lung: Secondary | ICD-10-CM | POA: Diagnosis not present

## 2016-11-29 DIAGNOSIS — D649 Anemia, unspecified: Secondary | ICD-10-CM | POA: Diagnosis not present

## 2016-11-29 DIAGNOSIS — I10 Essential (primary) hypertension: Secondary | ICD-10-CM | POA: Diagnosis not present

## 2016-11-29 DIAGNOSIS — I4891 Unspecified atrial fibrillation: Secondary | ICD-10-CM | POA: Diagnosis not present

## 2016-11-29 DIAGNOSIS — G309 Alzheimer's disease, unspecified: Secondary | ICD-10-CM | POA: Diagnosis not present

## 2016-11-30 DIAGNOSIS — I10 Essential (primary) hypertension: Secondary | ICD-10-CM | POA: Diagnosis not present

## 2016-11-30 DIAGNOSIS — R627 Adult failure to thrive: Secondary | ICD-10-CM | POA: Diagnosis not present

## 2016-11-30 DIAGNOSIS — K219 Gastro-esophageal reflux disease without esophagitis: Secondary | ICD-10-CM | POA: Diagnosis not present

## 2016-11-30 DIAGNOSIS — G309 Alzheimer's disease, unspecified: Secondary | ICD-10-CM | POA: Diagnosis not present

## 2016-11-30 DIAGNOSIS — E46 Unspecified protein-calorie malnutrition: Secondary | ICD-10-CM | POA: Diagnosis not present

## 2016-11-30 DIAGNOSIS — Z8551 Personal history of malignant neoplasm of bladder: Secondary | ICD-10-CM | POA: Diagnosis not present

## 2016-11-30 DIAGNOSIS — Z85118 Personal history of other malignant neoplasm of bronchus and lung: Secondary | ICD-10-CM | POA: Diagnosis not present

## 2016-11-30 DIAGNOSIS — J449 Chronic obstructive pulmonary disease, unspecified: Secondary | ICD-10-CM | POA: Diagnosis not present

## 2016-11-30 DIAGNOSIS — D649 Anemia, unspecified: Secondary | ICD-10-CM | POA: Diagnosis not present

## 2016-11-30 DIAGNOSIS — I4891 Unspecified atrial fibrillation: Secondary | ICD-10-CM | POA: Diagnosis not present

## 2016-11-30 DIAGNOSIS — E119 Type 2 diabetes mellitus without complications: Secondary | ICD-10-CM | POA: Diagnosis not present

## 2016-11-30 DIAGNOSIS — E039 Hypothyroidism, unspecified: Secondary | ICD-10-CM | POA: Diagnosis not present

## 2016-12-01 DIAGNOSIS — J449 Chronic obstructive pulmonary disease, unspecified: Secondary | ICD-10-CM | POA: Diagnosis not present

## 2016-12-01 DIAGNOSIS — Z85118 Personal history of other malignant neoplasm of bronchus and lung: Secondary | ICD-10-CM | POA: Diagnosis not present

## 2016-12-01 DIAGNOSIS — R627 Adult failure to thrive: Secondary | ICD-10-CM | POA: Diagnosis not present

## 2016-12-01 DIAGNOSIS — D649 Anemia, unspecified: Secondary | ICD-10-CM | POA: Diagnosis not present

## 2016-12-01 DIAGNOSIS — E119 Type 2 diabetes mellitus without complications: Secondary | ICD-10-CM | POA: Diagnosis not present

## 2016-12-01 DIAGNOSIS — G309 Alzheimer's disease, unspecified: Secondary | ICD-10-CM | POA: Diagnosis not present

## 2016-12-01 DIAGNOSIS — K219 Gastro-esophageal reflux disease without esophagitis: Secondary | ICD-10-CM | POA: Diagnosis not present

## 2016-12-01 DIAGNOSIS — E039 Hypothyroidism, unspecified: Secondary | ICD-10-CM | POA: Diagnosis not present

## 2016-12-01 DIAGNOSIS — E46 Unspecified protein-calorie malnutrition: Secondary | ICD-10-CM | POA: Diagnosis not present

## 2016-12-01 DIAGNOSIS — I4891 Unspecified atrial fibrillation: Secondary | ICD-10-CM | POA: Diagnosis not present

## 2016-12-01 DIAGNOSIS — Z8551 Personal history of malignant neoplasm of bladder: Secondary | ICD-10-CM | POA: Diagnosis not present

## 2016-12-01 DIAGNOSIS — I10 Essential (primary) hypertension: Secondary | ICD-10-CM | POA: Diagnosis not present

## 2016-12-02 DIAGNOSIS — E039 Hypothyroidism, unspecified: Secondary | ICD-10-CM | POA: Diagnosis not present

## 2016-12-02 DIAGNOSIS — E46 Unspecified protein-calorie malnutrition: Secondary | ICD-10-CM | POA: Diagnosis not present

## 2016-12-02 DIAGNOSIS — Z8551 Personal history of malignant neoplasm of bladder: Secondary | ICD-10-CM | POA: Diagnosis not present

## 2016-12-02 DIAGNOSIS — Z85118 Personal history of other malignant neoplasm of bronchus and lung: Secondary | ICD-10-CM | POA: Diagnosis not present

## 2016-12-02 DIAGNOSIS — I4891 Unspecified atrial fibrillation: Secondary | ICD-10-CM | POA: Diagnosis not present

## 2016-12-02 DIAGNOSIS — D649 Anemia, unspecified: Secondary | ICD-10-CM | POA: Diagnosis not present

## 2016-12-02 DIAGNOSIS — R627 Adult failure to thrive: Secondary | ICD-10-CM | POA: Diagnosis not present

## 2016-12-02 DIAGNOSIS — G309 Alzheimer's disease, unspecified: Secondary | ICD-10-CM | POA: Diagnosis not present

## 2016-12-02 DIAGNOSIS — K219 Gastro-esophageal reflux disease without esophagitis: Secondary | ICD-10-CM | POA: Diagnosis not present

## 2016-12-02 DIAGNOSIS — I10 Essential (primary) hypertension: Secondary | ICD-10-CM | POA: Diagnosis not present

## 2016-12-02 DIAGNOSIS — J449 Chronic obstructive pulmonary disease, unspecified: Secondary | ICD-10-CM | POA: Diagnosis not present

## 2016-12-02 DIAGNOSIS — E119 Type 2 diabetes mellitus without complications: Secondary | ICD-10-CM | POA: Diagnosis not present

## 2016-12-03 DIAGNOSIS — Z8551 Personal history of malignant neoplasm of bladder: Secondary | ICD-10-CM | POA: Diagnosis not present

## 2016-12-03 DIAGNOSIS — R627 Adult failure to thrive: Secondary | ICD-10-CM | POA: Diagnosis not present

## 2016-12-03 DIAGNOSIS — G309 Alzheimer's disease, unspecified: Secondary | ICD-10-CM | POA: Diagnosis not present

## 2016-12-03 DIAGNOSIS — K219 Gastro-esophageal reflux disease without esophagitis: Secondary | ICD-10-CM | POA: Diagnosis not present

## 2016-12-03 DIAGNOSIS — D649 Anemia, unspecified: Secondary | ICD-10-CM | POA: Diagnosis not present

## 2016-12-03 DIAGNOSIS — J449 Chronic obstructive pulmonary disease, unspecified: Secondary | ICD-10-CM | POA: Diagnosis not present

## 2016-12-03 DIAGNOSIS — I4891 Unspecified atrial fibrillation: Secondary | ICD-10-CM | POA: Diagnosis not present

## 2016-12-03 DIAGNOSIS — E119 Type 2 diabetes mellitus without complications: Secondary | ICD-10-CM | POA: Diagnosis not present

## 2016-12-03 DIAGNOSIS — E039 Hypothyroidism, unspecified: Secondary | ICD-10-CM | POA: Diagnosis not present

## 2016-12-03 DIAGNOSIS — I10 Essential (primary) hypertension: Secondary | ICD-10-CM | POA: Diagnosis not present

## 2016-12-03 DIAGNOSIS — Z85118 Personal history of other malignant neoplasm of bronchus and lung: Secondary | ICD-10-CM | POA: Diagnosis not present

## 2016-12-03 DIAGNOSIS — E46 Unspecified protein-calorie malnutrition: Secondary | ICD-10-CM | POA: Diagnosis not present

## 2016-12-04 DIAGNOSIS — I4891 Unspecified atrial fibrillation: Secondary | ICD-10-CM | POA: Diagnosis not present

## 2016-12-04 DIAGNOSIS — G309 Alzheimer's disease, unspecified: Secondary | ICD-10-CM | POA: Diagnosis not present

## 2016-12-04 DIAGNOSIS — R627 Adult failure to thrive: Secondary | ICD-10-CM | POA: Diagnosis not present

## 2016-12-04 DIAGNOSIS — J449 Chronic obstructive pulmonary disease, unspecified: Secondary | ICD-10-CM | POA: Diagnosis not present

## 2016-12-04 DIAGNOSIS — D649 Anemia, unspecified: Secondary | ICD-10-CM | POA: Diagnosis not present

## 2016-12-04 DIAGNOSIS — I10 Essential (primary) hypertension: Secondary | ICD-10-CM | POA: Diagnosis not present

## 2016-12-04 DIAGNOSIS — E039 Hypothyroidism, unspecified: Secondary | ICD-10-CM | POA: Diagnosis not present

## 2016-12-04 DIAGNOSIS — Z8551 Personal history of malignant neoplasm of bladder: Secondary | ICD-10-CM | POA: Diagnosis not present

## 2016-12-04 DIAGNOSIS — K219 Gastro-esophageal reflux disease without esophagitis: Secondary | ICD-10-CM | POA: Diagnosis not present

## 2016-12-04 DIAGNOSIS — Z85118 Personal history of other malignant neoplasm of bronchus and lung: Secondary | ICD-10-CM | POA: Diagnosis not present

## 2016-12-04 DIAGNOSIS — E119 Type 2 diabetes mellitus without complications: Secondary | ICD-10-CM | POA: Diagnosis not present

## 2016-12-04 DIAGNOSIS — E46 Unspecified protein-calorie malnutrition: Secondary | ICD-10-CM | POA: Diagnosis not present

## 2016-12-05 ENCOUNTER — Other Ambulatory Visit: Payer: Self-pay

## 2016-12-05 MED ORDER — HYDROCODONE-ACETAMINOPHEN 5-325 MG PO TABS: 1.0000 | ORAL_TABLET | ORAL | 0 refills | Status: DC | PRN

## 2016-12-05 NOTE — Progress Notes (Signed)
Location:   [facility] Room Number: [place] of Service:  SNF [phone]) Provider:  Toni Arthurs, NP-C  Kirk Ruths, MD  Patient Care Team: Kirk Ruths, MD as PCP - General (Internal Medicine) Einar Pheasant, MD (Internal Medicine)  Extended Emergency Contact Information Primary Emergency Contact: Duwayne Heck Address: [address]          [place], [place] [address] [name] of [place] Phone: [phone] Relation: None Secondary Emergency Contact: [name] Address: [address]          [place], [place] [address] Home Phone: [phone] Relation: None  Code Status:  DNR Goals of care: Advanced Directive information Advanced Directives 11/19/2016  Does Patient Have a Medical Advance Directive? Yes  Type of Advance Directive Out of facility DNR (pink MOST or yellow form)  Does patient want to make changes to medical advance directive? No - Patient declined  Copy of [place] in Chart? -     Chief Complaint  Patient presents with  . Medical Management of Chronic Issues    Routine Visit    HPI:  Pt is a 83 y.o. female seen today for medical management of chronic diseases.  Patient has been stable over the past month.  She continues to have a very slow decline in cognition and mobility.  She spends the majority of her day in the recliner making holders.  Patient is very slow to speak or respond.  However, she always has a big smile on her face when spoken to.  She tries to communicate, but her words are mostly unintelligible now.  When asked directly, patient denies pain or any other negative symptoms.  She reports she is doing well.  No falls this month.  Patient was being followed by hospice for symptom management and monitoring.  However, due to stability, hospice will be discharging her from services in the near future.  Patient will be referred back to palliative medicine when this does occur.  Vital  signs stable.  No other complaints.   Past Medical History:  Diagnosis Date  . Anemia   . Atrial fibrillation ([place])    post op, converted to NSR wtih amiodarone  . Bladder cancer ([place]) 2003   s/p resection and chemotherapy, followed by Dr Jacqlyn Larsen  . COPD (chronic obstructive pulmonary disease) ([place])   . Dementia   . Diabetes mellitus ([place])   . Diabetes mellitus without complication ([place])   . GERD (gastroesophageal reflux disease)   . Hypercholesterolemia   . Hyperlipemia   . Hypertension   . Hypothyroidism   . Inner ear disease   . Lung cancer ([place])    squamous cell s/p left upper lobe llingulectomy 12/08/04 - Dr Arlyce Dice   Past Surgical History:  Procedure Laterality Date  . BLADDER SURGERY  2003   for bladder cancer - Dr Jacqlyn Larsen  . CHOLECYSTECTOMY  1986  . LUNG CANCER SURGERY  12/08/04   s/p left upper lobe lingulectomy - Dr Arlyce Dice    Allergies  Allergen Reactions  . Lovenox [Enoxaparin Sodium]   . Novolog [Insulin Aspart]     Allergies as of 11/19/2016      Reactions   Lovenox [enoxaparin Sodium]    Novolog [insulin Aspart]       Medication List        Accurate as of 11/19/16 11:59 PM. Always use your most recent med list.          acetaminophen 650  MG CR tablet Commonly known as:  TYLENOL Take 650 mg by mouth 3 (three) times daily. Do not exceed 3000 mg in 24 hr   bisacodyl 10 MG suppository Commonly known as:  DULCOLAX Place 10 mg rectally daily as needed for mild constipation or moderate constipation. *If Milk of Magnesia does not resolve constipation.*   feeding supplement (GLUCERNA SHAKE) Liqd Take 237 mLs by mouth 3 (three) times daily between meals. Resident prefers vanilla   gabapentin 100 MG capsule Commonly known as:  NEURONTIN Take 100 mg by mouth daily. 9 am   gabapentin 300 MG capsule Commonly known as:  NEURONTIN Take 300 mg by mouth at bedtime.   glimepiride 2 MG tablet Commonly known as:  AMARYL Take 3 mg by mouth 2 (two) times  daily. 1 and 1/2 tablet = 3 mg   HYDROcodone-acetaminophen 5-325 MG tablet Commonly known as:  NORCO/VICODIN Take 1 tablet by mouth every 4 (four) hours as needed. Do not exceed 3000 mg of tylenol in 24 hours. Consider all sources.   ipratropium-albuterol 0.5-2.5 (3) MG/3ML Soln Commonly known as:  DUONEB Take 3 mLs by nebulization 3 (three) times daily.   ipratropium-albuterol 0.5-2.5 (3) MG/3ML Soln Commonly known as:  DUONEB Take 3 mLs by nebulization every 6 (six) hours as needed.   Lidocaine 4 % Ptch Apply 1 patch topically daily. Apply at 9 am to area on lower back daily. Remove after 12 hours   losartan 50 MG tablet Commonly known as:  COZAAR Take 50 mg by mouth daily.   MAG-AL PLUS XS [phone] MG/5ML suspension Generic drug:  alum & mag hydroxide-simeth Take 15 mLs by mouth 4 (four) times daily. Give with or right after meals and at bedtime; May also give 30 ml every 4 hours as needed   magnesium hydroxide 400 MG/5ML suspension Commonly known as:  MILK OF MAGNESIA Take 30 mLs by mouth every 4 (four) hours as needed for mild constipation. If Constipation/ no BM for 2 days.  *no results in 24 hours may administer bisacodyl suppository*   Melatonin 3 MG Tabs Take 6 mg by mouth at bedtime. 2 tabs   metoprolol succinate 25 MG 24 hr tablet Commonly known as:  TOPROL-XL Take 25 mg by mouth daily.   mirtazapine 15 MG tablet Commonly known as:  REMERON Take 7.5 mg by mouth at bedtime. 1/2 tab   montelukast 10 MG tablet Commonly known as:  SINGULAIR Take 10 mg by mouth at bedtime.   nystatin powder Commonly known as:  MYCOSTATIN/NYSTOP Apply thin film of powder topically underneath each breast every shift until healed for redness/itching.   omeprazole 20 MG capsule Commonly known as:  PRILOSEC Take 20 mg by mouth 2 (two) times daily.   polyethylene glycol packet Commonly known as:  MIRALAX / GLYCOLAX Take 17 g by mouth daily.   rOPINIRole 0.25 MG  tablet Commonly known as:  REQUIP Take 1 tablet (0.25 mg total) by mouth at bedtime.   sennosides-docusate sodium 8.6-50 MG tablet Commonly known as:  SENOKOT-S Take 1 tablet by mouth 2 (two) times daily.   torsemide 5 MG tablet Commonly known as:  DEMADEX Take 5 mg by mouth daily.   trolamine salicylate 10 % cream Commonly known as:  ASPERCREME Apply 1 application topically 3 (three) times daily as needed for muscle pain. Apply and massage on the right hip/outer thigh.       Review of Systems  Unable to perform ROS: Dementia  Constitutional: Negative.  HENT: Negative for congestion, postnasal drip, rhinorrhea, sinus pressure, trouble swallowing and voice change.   Eyes: Negative.   Respiratory: Negative for cough, choking, chest tightness, shortness of breath and wheezing.   Cardiovascular: Negative for chest pain and leg swelling.  Gastrointestinal: Negative.   Genitourinary: Negative.   Musculoskeletal: Negative.   Skin: Negative.   Neurological: Positive for speech difficulty.  Psychiatric/Behavioral: Positive for confusion.  All other systems reviewed and are negative.   Immunization History  Administered Date(s) Administered  . Influenza Split 12/05/2011  . Influenza-Unspecified 10/21/2013, 10/08/2014, 10/08/2015, 10/20/2016  . PPD Test 11/05/2015, 10/22/2016   Pertinent  Health Maintenance Due  Topic Date Due  . FOOT EXAM  12/05/1943  . OPHTHALMOLOGY EXAM  12/05/1943  . DEXA SCAN  12/05/2000  . PNA vac Low Risk Adult (1 of 2 - PCV13) 12/05/2000  . HEMOGLOBIN A1C  11/20/2016  . INFLUENZA VACCINE  Completed   No flowsheet data found. Functional Status Survey:    Vitals:   11/19/16 1459  BP: (!) 129/58  Pulse: 71  Resp: 18  Temp: 97.9 F (36.6 C)  SpO2: 95%  Weight: 142 lb (64.4 kg)  Height: 5\' 5"  (1.651 m)   Body mass index is 23.63 kg/m. Physical Exam  Constitutional: She is oriented to person, place, and time. Vital signs are normal. She  appears well-developed and well-nourished. No distress.  HENT:  Head: Normocephalic and atraumatic.  Mouth/Throat: Oropharynx is clear and moist.  Eyes: Conjunctivae and EOM are normal. Pupils are equal, round, and reactive to light.  Neck: Normal range of motion. Neck supple. No JVD present.  Cardiovascular: Normal rate, regular rhythm, normal heart sounds and intact distal pulses. Exam reveals no gallop and no friction rub.  No murmur heard. Pulses:      Dorsalis pedis pulses are 2+ on the right side, and 2+ on the left side.  No edema  Pulmonary/Chest: Effort normal and breath sounds normal. No respiratory distress. She has no decreased breath sounds. She has no wheezes. She has no rhonchi. She has no rales. She exhibits no tenderness.  Abdominal: Soft. Bowel sounds are normal. She exhibits no distension. There is no tenderness. There is no rebound and no guarding.  Lymphadenopathy:    She has no cervical adenopathy.  Neurological: She is alert and oriented to person, place, and time. Coordination abnormal.  Skin: Skin is warm and dry. No rash noted. She is not diaphoretic. No pallor.  Psychiatric: She has a normal mood and affect. Her behavior is normal. Judgment and thought content normal. Her speech is delayed. Cognition and memory are impaired. She exhibits abnormal recent memory.  Nursing note and vitals reviewed.   Labs reviewed: Recent Labs    05/21/16 1300 07/13/16 0710  NA 142 140  K 3.7 3.9  CL 105 106  CO2 25 26  GLUCOSE 158* 128*  BUN 23* 25*  CREATININE 0.96 0.80  CALCIUM 9.1 9.0  MG 1.7  --    Recent Labs    05/21/16 1300 07/13/16 0710  AST 25 15  ALT 18 14  ALKPHOS 47 46  BILITOT 0.5 0.6  PROT 6.7 6.4*  ALBUMIN 3.9 3.6   Recent Labs    05/21/16 1300 07/13/16 0710  WBC 8.9 9.2  NEUTROABS 5.7 4.7  HGB 14.3 13.6  HCT 44.2 40.4  MCV 91.2 89.1  PLT 292 308   Lab Results  Component Value Date   TSH 1.359 05/21/2016   Lab Results  Component  Value Date   HGBA1C 6.8 (H) 05/21/2016   Lab Results  Component Value Date   CHOL 170 10/21/2015   HDL 40 (L) 10/21/2015   LDLCALC 92 10/21/2015   TRIG 188 (H) 10/21/2015   CHOLHDL 4.3 10/21/2015    Significant Diagnostic Results in last 30 days:  No results found.  Assessment/Plan 1. Late onset Alzheimer's disease without behavioral disturbance 2. Dementia associated with other underlying disease without behavioral disturbance  Stable  Hospice services  3. Hx of falling  Stable  4. Anemia, unspecified type  Stable  5. Essential (primary) hypertension  Stable  Continue losartan 50 mg by mouth daily  Continue metoprolol ER 25 mg tablet by mouth daily  6. Chronic obstructive pulmonary disease, unspecified COPD type (HCC)  Stable  Continue Advair HFA 115-10mcg 2 puffs every 12 hours for COPD  Continue Singulair 10 mg one tablet by mouth daily at bedtime  Continue Spiriva 18 g 1 capsule inhaled once daily   7. Type 2 diabetes mellitus without complication, without long-term current use of insulin (HCC)  Stable  Continue glimepiride 2 mg tablets 1-1/2 tabs by mouth twice a day  Continue metformin 500 mg by mouth twice a day  8. Gastroesophageal reflux disease without esophagitis  Stable  Continue omeprazole 20 mg by mouth daily  9. Pure hypercholesterolemia  Stable  10. Hypothyroidism, unspecified type  Stable  Monitoring  11. Constipation, unspecified constipation type  Stable  Milk of magnesia 30 mL by mouth every 4 hours when necessary constipation  Bisacodyl suppository 10 mg per rectum daily when necessary  12. Allergic rhinitis, unspecified seasonality, unspecified trigger  Stable  Discontinue cetirizine 5 mg by mouth daily at bedtime  13. Unsp psychosis not due to a substance or known physiol cond  Stable  14. Dysphagia, oropharyngeal phase  Stable  15. Vitamin B12 deficiency anemia due to intrinsic factor  deficiency  Stable  Discontinue folic acid 109 g by mouth daily  Discontinue cyanocobalamin 250 g by mouth daily  16. Osteoarthritis of right hip, unspecified osteoarthritis type  Stable  Continue Tylenol 650 mg by mouth 3 times a day  Continue cholecalciferol 1000 units by mouth daily  Continue Os-Cal 500+ D31 tab by mouth twice a day  Continue gabapentin 300 mg by mouth daily at bedtime for pain  Continue gabapentin 100 mg by mouth daily pain  Continue hydrocodone-acetaminophen 5-3 25 mg 1 tablet by mouth every 4 hours when necessary pain  17. Cognitive communication deficit  Stable  18. Insomnia, unspecified type  Stable  Melatonin 3 mg tablets 2 tablets by mouth daily at bedtime  19. Edema, unspecified type  Stable   Continue torsemide 5 mg tablet by mouth daily  20. Hypomagnesemia  Stable  Attending magnesium oxide 250 mg tablet 1 tablet by mouth daily  21. Renal insufficiency  Stable  Renally adjust medications as appropriate  22. RLS (restless legs syndrome)  Stable  Continue ropinirole 0.25 mg by mouth daily at bedtime  23. Friction injury to skin  Stable   Skin prep to B- Great toes BID  Skin Prep to B-heels BID  Continue all medications as listed above  Family/ staff Communication:   Total Time:   Documentation:   Face to Face:   Family/Phone:    Labs/tests ordered: Not due  Medication list reviewed and assessed for continued appropriateness. Monthly medication orders reviewed and signed.  Vikki Ports, NP-C Geriatrics [facility] [phone]  [place], [place] [address] Cell Phone (Mon-Fri 8am-5pm):  [phone] On Call:  [phone] & follow prompts after 5pm & weekends Office Phone:  [phone] Office Fax:  [phone]

## 2016-12-05 NOTE — Telephone Encounter (Signed)
Rx sent to [facility] phone : [phone] , fax : [phone]  

## 2016-12-12 ENCOUNTER — Other Ambulatory Visit: Payer: Self-pay

## 2016-12-12 MED ORDER — HYDROCODONE-ACETAMINOPHEN 5-325 MG PO TABS: 1.0000 | ORAL_TABLET | ORAL | 0 refills | Status: DC | PRN

## 2016-12-12 NOTE — Telephone Encounter (Signed)
Rx sent to [facility] phone : [phone] , fax : [phone]  

## 2016-12-18 ENCOUNTER — Non-Acute Institutional Stay (SKILLED_NURSING_FACILITY): Payer: Medicare Other | Admitting: Gerontology

## 2016-12-18 DIAGNOSIS — L03031 Cellulitis of right toe: Secondary | ICD-10-CM

## 2016-12-18 DIAGNOSIS — I10 Essential (primary) hypertension: Secondary | ICD-10-CM

## 2016-12-18 DIAGNOSIS — R1312 Dysphagia, oropharyngeal phase: Secondary | ICD-10-CM

## 2016-12-18 DIAGNOSIS — C3490 Malignant neoplasm of unspecified part of unspecified bronchus or lung: Secondary | ICD-10-CM | POA: Diagnosis not present

## 2016-12-20 ENCOUNTER — Encounter: Payer: Self-pay | Admitting: Gerontology

## 2016-12-22 ENCOUNTER — Encounter
Admission: RE | Admit: 2016-12-22 | Discharge: 2016-12-22 | Disposition: A | Discharge status: Home / Self Care | Payer: Medicare Other | Source: Ambulatory Visit | Attending: Internal Medicine | Admitting: Internal Medicine

## 2017-01-03 ENCOUNTER — Encounter: Payer: Self-pay | Admitting: Gerontology

## 2017-01-03 ENCOUNTER — Non-Acute Institutional Stay (SKILLED_NURSING_FACILITY): Payer: Medicare Other | Admitting: Gerontology

## 2017-01-03 DIAGNOSIS — L03031 Cellulitis of right toe: Secondary | ICD-10-CM | POA: Diagnosis not present

## 2017-01-03 NOTE — Progress Notes (Signed)
Location:   [facility] Room Number: [place] of Service:  SNF [phone]) Provider:  Toni Arthurs, NP-C  Kirk Ruths, MD  Patient Care Team: Kirk Ruths, MD as PCP - General (Internal Medicine) Einar Pheasant, MD (Internal Medicine)  Extended Emergency Contact Information Primary Emergency Contact: Duwayne Heck Address: [address]          [place], [place] [address] [name] of [place] Phone: [phone] Relation: None Secondary Emergency Contact: [name] Address: [address]          [place], [place] [address] Home Phone: [phone] Relation: None  Code Status:  DNR Goals of care: Advanced Directive information Advanced Directives 01/03/2017  Does Patient Have a Medical Advance Directive? Yes  Type of Advance Directive Out of facility DNR (pink MOST or yellow form)  Does patient want to make changes to medical advance directive? No - Patient declined  Copy of [place] in Chart? -     Chief Complaint  Patient presents with  . Acute Visit    Check patient's toe    HPI:  Pt is a 83 y.o. female seen today for an acute visit for recheck of the patient's toe.  Patient was seen 2 weeks ago and was found to have cellulitis of the right third toe.  Redness, mild warmth and tenderness continue.  Toe now has mild weeping of serous fluid around the nail.  Otherwise, appearance is not much different from previous assessment.  Patient is minimally verbal and unable to give complete ROS.   Past Medical History:  Diagnosis Date  . Anemia   . Atrial fibrillation ([place])    post op, converted to NSR wtih amiodarone  . Bladder cancer ([place]) 2003   s/p resection and chemotherapy, followed by Dr Jacqlyn Larsen  . COPD (chronic obstructive pulmonary disease) ([place])   . Dementia   . Diabetes mellitus ([place])   . Diabetes mellitus without complication ([place])   . GERD (gastroesophageal reflux disease)   . Hypercholesterolemia    . Hyperlipemia   . Hypertension   . Hypothyroidism   . Inner ear disease   . Lung cancer ([place])    squamous cell s/p left upper lobe llingulectomy 12/08/04 - Dr Arlyce Dice   Past Surgical History:  Procedure Laterality Date  . BLADDER SURGERY  2003   for bladder cancer - Dr Jacqlyn Larsen  . CHOLECYSTECTOMY  1986  . LUNG CANCER SURGERY  12/08/04   s/p left upper lobe lingulectomy - Dr Arlyce Dice    Allergies  Allergen Reactions  . Lovenox [Enoxaparin Sodium]   . Novolog [Insulin Aspart]     Allergies as of 01/03/2017      Reactions   Lovenox [enoxaparin Sodium]    Novolog [insulin Aspart]       Medication List        Accurate as of 01/03/17  5:15 PM. Always use your most recent med list.          acetaminophen 650 MG CR tablet Commonly known as:  TYLENOL Take 650 mg by mouth 3 (three) times daily. Do not exceed 3000 mg in 24 hr   bisacodyl 10 MG suppository Commonly known as:  DULCOLAX Place 10 mg rectally daily as needed for mild constipation or moderate constipation. *If Milk of Magnesia does not resolve constipation.*   feeding supplement (GLUCERNA SHAKE) Liqd Take 237 mLs by mouth 3 (three) times daily between meals. Resident prefers vanilla   gabapentin 100  MG capsule Commonly known as:  NEURONTIN Take 100 mg by mouth daily. 9 am   gabapentin 300 MG capsule Commonly known as:  NEURONTIN Take 300 mg by mouth at bedtime.   glimepiride 2 MG tablet Commonly known as:  AMARYL Take 3 mg by mouth 2 (two) times daily. 1 and 1/2 tablet = 3 mg   HYDROcodone-acetaminophen 5-325 MG tablet Commonly known as:  NORCO/VICODIN Take 1 tablet by mouth every 4 (four) hours as needed. Do not exceed 3000 mg of tylenol in 24 hours. Consider all sources.   ipratropium-albuterol 0.5-2.5 (3) MG/3ML Soln Commonly known as:  DUONEB Take 3 mLs by nebulization 3 (three) times daily.   ipratropium-albuterol 0.5-2.5 (3) MG/3ML Soln Commonly known as:  DUONEB Take 3 mLs by nebulization every  6 (six) hours as needed.   Lidocaine 4 % Ptch Apply 1 patch topically daily. Apply at 9 am to area on lower back daily. Remove after 12 hours   losartan 50 MG tablet Commonly known as:  COZAAR Take 50 mg by mouth daily.   MAG-AL PLUS XS [phone] MG/5ML suspension Generic drug:  alum & mag hydroxide-simeth Take 15 mLs by mouth 4 (four) times daily. Give with or right after meals and at bedtime; May also give 30 ml every 4 hours as needed   magnesium hydroxide 400 MG/5ML suspension Commonly known as:  MILK OF MAGNESIA Take 30 mLs by mouth every 4 (four) hours as needed for mild constipation. If Constipation/ no BM for 2 days.  *no results in 24 hours may administer bisacodyl suppository*   Melatonin 3 MG Tabs Take 6 mg by mouth at bedtime. 2 tabs   metoprolol succinate 25 MG 24 hr tablet Commonly known as:  TOPROL-XL Take 25 mg by mouth daily.   mirtazapine 15 MG tablet Commonly known as:  REMERON Take 7.5 mg by mouth at bedtime. 1/2 tab   montelukast 10 MG tablet Commonly known as:  SINGULAIR Take 10 mg by mouth at bedtime.   nystatin powder Commonly known as:  MYCOSTATIN/NYSTOP Apply thin film of powder topically underneath each breast every shift until healed for redness/itching.   nystatin cream Commonly known as:  MYCOSTATIN Apply thin film in folds of stomach and thigh areas 2 times daily until areas are healed   omeprazole 20 MG capsule Commonly known as:  PRILOSEC Take 20 mg by mouth 2 (two) times daily.   polyethylene glycol packet Commonly known as:  MIRALAX / GLYCOLAX Take 17 g by mouth daily.   rOPINIRole 0.25 MG tablet Commonly known as:  REQUIP Take 1 tablet (0.25 mg total) by mouth at bedtime.   sennosides-docusate sodium 8.6-50 MG tablet Commonly known as:  SENOKOT-S Take 1 tablet by mouth every other day.   torsemide 5 MG tablet Commonly known as:  DEMADEX Take 5 mg by mouth daily.   trolamine salicylate 10 % cream Commonly known as:   ASPERCREME Apply 1 application topically 3 (three) times daily as needed for muscle pain. Apply and massage on the right hip/outer thigh.       Review of Systems  Unable to perform ROS: Dementia  Musculoskeletal: Positive for arthralgias and joint swelling.  Skin: Positive for wound.  All other systems reviewed and are negative.   Immunization History  Administered Date(s) Administered  . Influenza Split 12/05/2011  . Influenza-Unspecified 10/21/2013, 10/08/2014, 10/08/2015, 10/20/2016  . PPD Test 11/05/2015, 10/22/2016   Pertinent  Health Maintenance Due  Topic Date Due  . FOOT EXAM  12/05/1943  . OPHTHALMOLOGY EXAM  12/05/1943  . DEXA SCAN  12/05/2000  . PNA vac Low Risk Adult (1 of 2 - PCV13) 12/05/2000  . HEMOGLOBIN A1C  11/20/2016  . INFLUENZA VACCINE  Completed   No flowsheet data found. Functional Status Survey:    Vitals:   01/03/17 1708  BP: (!) 108/57  Pulse: 71  Resp: 20  Temp: 97.9 F (36.6 C)  TempSrc: Oral  SpO2: 93%  Weight: 141 lb 6.4 oz (64.1 kg)  Height: 5\' 2"  (1.575 m)   Body mass index is 25.86 kg/m. Physical Exam  Skin:  Cellulitis of the right third toe continues  Psychiatric: Her speech is delayed. She is slowed. Cognition and memory are impaired. She exhibits abnormal recent memory and abnormal remote memory.  Nursing note and vitals reviewed.   Labs reviewed: Recent Labs    05/21/16 1300 07/13/16 0710  NA 142 140  K 3.7 3.9  CL 105 106  CO2 25 26  GLUCOSE 158* 128*  BUN 23* 25*  CREATININE 0.96 0.80  CALCIUM 9.1 9.0  MG 1.7  --    Recent Labs    05/21/16 1300 07/13/16 0710  AST 25 15  ALT 18 14  ALKPHOS 47 46  BILITOT 0.5 0.6  PROT 6.7 6.4*  ALBUMIN 3.9 3.6   Recent Labs    05/21/16 1300 07/13/16 0710  WBC 8.9 9.2  NEUTROABS 5.7 4.7  HGB 14.3 13.6  HCT 44.2 40.4  MCV 91.2 89.1  PLT 292 308   Lab Results  Component Value Date   TSH 1.359 05/21/2016   Lab Results  Component Value Date   HGBA1C 6.8  (H) 05/21/2016   Lab Results  Component Value Date   CHOL 170 10/21/2015   HDL 40 (L) 10/21/2015   LDLCALC 92 10/21/2015   TRIG 188 (H) 10/21/2015   CHOLHDL 4.3 10/21/2015    Significant Diagnostic Results in last 30 days:  No results found.  Assessment/Plan Cellulitis of toe of right foot  Continue daily Epson salt soaks in warm water  Dry foot well  2 x 2 gauze in between toes  Apply Bactroban 2% cream-thin film to the right third toe daily  Family/ staff Communication:   Total Time:  Documentation:  Face to Face:  Family/Phone:   Labs/tests ordered: None  Medication list reviewed and assessed for continued appropriateness.  Vikki Ports, NP-C Geriatrics [facility] [phone], [place] [address] Cell Phone (Mon-Fri 8am-5pm):  [phone] On Call:  [phone] & follow prompts after 5pm & weekends Office Phone:  [phone] Office Fax:  [phone]

## 2017-01-07 ENCOUNTER — Non-Acute Institutional Stay (SKILLED_NURSING_FACILITY): Payer: Medicare Other | Admitting: Gerontology

## 2017-01-07 DIAGNOSIS — M25552 Pain in left hip: Secondary | ICD-10-CM | POA: Diagnosis not present

## 2017-01-07 DIAGNOSIS — M1612 Unilateral primary osteoarthritis, left hip: Secondary | ICD-10-CM | POA: Diagnosis not present

## 2017-01-07 DIAGNOSIS — M25562 Pain in left knee: Secondary | ICD-10-CM | POA: Diagnosis not present

## 2017-01-09 ENCOUNTER — Other Ambulatory Visit: Payer: Self-pay

## 2017-01-09 MED ORDER — HYDROCODONE-ACETAMINOPHEN 5-325 MG PO TABS: 1.0000 | ORAL_TABLET | Freq: Three times a day (TID) | ORAL | 0 refills | Status: DC

## 2017-01-09 NOTE — Telephone Encounter (Signed)
Rx sent to [facility] phone : [phone] , fax : [phone]  

## 2017-01-10 ENCOUNTER — Encounter: Payer: Self-pay | Admitting: Gerontology

## 2017-01-10 NOTE — Progress Notes (Addendum)
Location:   [facility] Room Number: [place] of Service:  SNF [phone]) Provider:  Toni Arthurs, NP-C  Kirk Ruths, MD  Patient Care Team: Kirk Ruths, MD as PCP - General (Internal Medicine) Einar Pheasant, MD (Internal Medicine)  Extended Emergency Contact Information Primary Emergency Contact: Duwayne Heck Address: [address]          [place], [place] [address] [name] of [place] Phone: [phone] Relation: None Secondary Emergency Contact: [name] Address: [address]          [place], [place] [address] Home Phone: [phone] Relation: None  Code Status:  DNR Goals of care: Advanced Directive information Advanced Directives 01/07/2017  Does Patient Have a Medical Advance Directive? Yes  Type of Advance Directive Out of facility DNR (pink MOST or yellow form)  Does patient want to make changes to medical advance directive? No - Patient declined  Copy of [place] in Chart? -     Chief Complaint  Patient presents with  . Acute Visit    Recheck pain    HPI:  Pt is a 83 y.o. female seen today for an acute visit for pain of unknown origin.  Family and staff concerned as patient has been moaning intermittently for several days.  Patient has advanced dementia and is unable to provide complete ROS.  Patient is unable to verbalize location or characteristics of pain.  Complete assessment was performed with thorough palpation and range of motion of joints of extremities x4.  Patient was minimally reactive to the procedures with all joints with the exception of some facial grimacing and increased respirations with palpation and movement of the left hip and knee.  Patient has not had any recent falls.  No joint warmth or redness.  Mild edema to the knee.  No abrasions or other skin irritations noted.  We will obtain x-rays to assess for idiopathic fractures, etc.  Patient's sister was present during the  exam.   Past Medical History:  Diagnosis Date  . Anemia   . Atrial fibrillation ([place])    post op, converted to NSR wtih amiodarone  . Bladder cancer ([place]) 2003   s/p resection and chemotherapy, followed by Dr Jacqlyn Larsen  . COPD (chronic obstructive pulmonary disease) ([place])   . Dementia   . Diabetes mellitus ([place])   . Diabetes mellitus without complication ([place])   . GERD (gastroesophageal reflux disease)   . Hypercholesterolemia   . Hyperlipemia   . Hypertension   . Hypothyroidism   . Inner ear disease   . Lung cancer ([place])    squamous cell s/p left upper lobe llingulectomy 12/08/04 - Dr Arlyce Dice   Past Surgical History:  Procedure Laterality Date  . BLADDER SURGERY  2003   for bladder cancer - Dr Jacqlyn Larsen  . CHOLECYSTECTOMY  1986  . LUNG CANCER SURGERY  12/08/04   s/p left upper lobe lingulectomy - Dr Arlyce Dice    Allergies  Allergen Reactions  . Lovenox [Enoxaparin Sodium]   . Novolog [Insulin Aspart]     Allergies as of 01/07/2017      Reactions   Lovenox [enoxaparin Sodium]    Novolog [insulin Aspart]       Medication List        Accurate as of 01/07/17 11:59 PM. Always use your most recent med list.          bisacodyl 10 MG suppository Commonly known as:  DULCOLAX Place 10 mg rectally  daily as needed for mild constipation or moderate constipation. *If Milk of Magnesia does not resolve constipation.*   feeding supplement (GLUCERNA SHAKE) Liqd Take 237 mLs by mouth 3 (three) times daily with meals. Resident prefers vanilla   gabapentin 100 MG capsule Commonly known as:  NEURONTIN Take 100 mg by mouth daily. 9 am   gabapentin 300 MG capsule Commonly known as:  NEURONTIN Take 300 mg by mouth at bedtime.   glimepiride 2 MG tablet Commonly known as:  AMARYL Take 3 mg by mouth 2 (two) times daily. 1 and 1/2 tablet = 3 mg   HYDROcodone-acetaminophen 5-325 MG tablet Commonly known as:  NORCO/VICODIN Take 1 tablet by mouth every 4 (four) hours as needed. Do not  exceed 3000 mg of tylenol in 24 hours. Consider all sources.   HYDROcodone-acetaminophen 5-325 MG tablet Commonly known as:  NORCO/VICODIN Take 1 tablet by mouth 3 (three) times daily.   ipratropium-albuterol 0.5-2.5 (3) MG/3ML Soln Commonly known as:  DUONEB Take 3 mLs by nebulization 3 (three) times daily.   ipratropium-albuterol 0.5-2.5 (3) MG/3ML Soln Commonly known as:  DUONEB Take 3 mLs by nebulization every 6 (six) hours as needed.   Lidocaine 4 % Ptch Apply 1 patch topically daily. Apply at 9 am to area on lower back daily. Remove after 12 hours   losartan 50 MG tablet Commonly known as:  COZAAR Take 50 mg by mouth daily.   MAG-AL PLUS XS [phone] MG/5ML suspension Generic drug:  alum & mag hydroxide-simeth Take 15 mLs by mouth 4 (four) times daily. Give with or right after meals and at bedtime; May also give 30 ml every 4 hours as needed   magnesium hydroxide 400 MG/5ML suspension Commonly known as:  MILK OF MAGNESIA Take 30 mLs by mouth every 4 (four) hours as needed for mild constipation. If Constipation/ no BM for 2 days.  *no results in 24 hours may administer bisacodyl suppository*   Melatonin 3 MG Tabs Take 6 mg by mouth at bedtime. 2 tabs   metoprolol succinate 25 MG 24 hr tablet Commonly known as:  TOPROL-XL Take 25 mg by mouth daily.   mirtazapine 15 MG tablet Commonly known as:  REMERON Take 7.5 mg by mouth at bedtime. 1/2 tab   montelukast 10 MG tablet Commonly known as:  SINGULAIR Take 10 mg by mouth at bedtime.   nystatin powder Commonly known as:  MYCOSTATIN/NYSTOP Apply thin film of powder topically underneath each breast every shift until healed for redness/itching.   nystatin cream Commonly known as:  MYCOSTATIN Apply thin film in folds of stomach and thigh areas 2 times daily until areas are healed   omeprazole 20 MG capsule Commonly known as:  PRILOSEC Take 20 mg by mouth 2 (two) times daily.   polyethylene glycol packet Commonly  known as:  MIRALAX / GLYCOLAX Take 17 g by mouth daily.   rOPINIRole 0.25 MG tablet Commonly known as:  REQUIP Take 1 tablet (0.25 mg total) by mouth at bedtime.   sennosides-docusate sodium 8.6-50 MG tablet Commonly known as:  SENOKOT-S Take 1 tablet by mouth every other day.   torsemide 5 MG tablet Commonly known as:  DEMADEX Take 5 mg by mouth daily.   trolamine salicylate 10 % cream Commonly known as:  ASPERCREME Apply 1 application topically 3 (three) times daily as needed for muscle pain. Apply and massage on the right hip/outer thigh.       Review of Systems  Unable to perform ROS: Dementia  Constitutional: Negative for activity change, appetite change, chills, diaphoresis and fever.  HENT: Negative for congestion, mouth sores, nosebleeds, postnasal drip, sneezing, sore throat, trouble swallowing and voice change.   Respiratory: Negative for apnea, cough, choking, chest tightness, shortness of breath and wheezing.   Cardiovascular: Negative for chest pain, palpitations and leg swelling.  Gastrointestinal: Negative for abdominal distention, abdominal pain, constipation, diarrhea and nausea.  Genitourinary: Negative for difficulty urinating, dysuria, frequency and urgency.  Musculoskeletal: Positive for arthralgias (typical arthritis), joint swelling and myalgias. Negative for back pain and gait problem.  Skin: Negative for color change, pallor, rash and wound.  Neurological: Negative for dizziness, tremors, syncope, speech difficulty, weakness, numbness and headaches.  Psychiatric/Behavioral: Negative for agitation and behavioral problems.  All other systems reviewed and are negative.   Immunization History  Administered Date(s) Administered  . Influenza Split 12/05/2011  . Influenza-Unspecified 10/21/2013, 10/08/2014, 10/08/2015, 10/20/2016  . PPD Test 11/05/2015, 10/22/2016   Pertinent  Health Maintenance Due  Topic Date Due  . FOOT EXAM  12/05/1943  .  OPHTHALMOLOGY EXAM  12/05/1943  . DEXA SCAN  12/05/2000  . PNA vac Low Risk Adult (1 of 2 - PCV13) 12/05/2000  . HEMOGLOBIN A1C  11/20/2016  . INFLUENZA VACCINE  Completed   No flowsheet data found. Functional Status Survey:    Vitals:   01/07/17 0855  BP: (!) 108/57  Pulse: 71  Resp: 20  Temp: 97.8 F (36.6 C)  TempSrc: Oral  SpO2: 93%  Weight: 141 lb 6.4 oz (64.1 kg)  Height: 5\' 2"  (1.575 m)   Body mass index is 25.86 kg/m. Physical Exam  Constitutional: She is oriented to person, place, and time. Vital signs are normal. She appears well-developed and well-nourished. She is active and cooperative. She does not appear ill. No distress.  HENT:  Head: Normocephalic and atraumatic.  Mouth/Throat: Uvula is midline, oropharynx is clear and moist and mucous membranes are normal. Mucous membranes are not pale, not dry and not cyanotic.  Eyes: Conjunctivae, EOM and lids are normal. Pupils are equal, round, and reactive to light.  Neck: Trachea normal, normal range of motion and full passive range of motion without pain. Neck supple. No JVD present. No tracheal deviation, no edema and no erythema present. No thyromegaly present.  Cardiovascular: Normal rate, regular rhythm, normal heart sounds, intact distal pulses and normal pulses. Exam reveals no gallop, no distant heart sounds and no friction rub.  No murmur heard. Pulses:      Dorsalis pedis pulses are 2+ on the right side, and 2+ on the left side.  No edema  Pulmonary/Chest: Effort normal and breath sounds normal. No accessory muscle usage. Tachypnea (with assessment of painful joints) noted. No respiratory distress. She has no decreased breath sounds. She has no wheezes. She has no rhonchi. She has no rales. She exhibits no tenderness.  Abdominal: Soft. Normal appearance and bowel sounds are normal. She exhibits no distension and no ascites. There is no tenderness.  Musculoskeletal: She exhibits no edema.       Left hip: She  exhibits decreased range of motion and tenderness. She exhibits no swelling, no crepitus, no deformity and no laceration.       Left knee: She exhibits decreased range of motion and swelling. She exhibits no effusion, no ecchymosis, no deformity, no laceration, no erythema, normal alignment, no LCL laxity, normal patellar mobility, no bony tenderness, normal meniscus and no MCL laxity. Tenderness found.  Expected osteoarthritis, stiffness; Bilateral Calves soft, supple. Negative  Homan's Sign. B- pedal pulses equal  Neurological: She is alert and oriented to person, place, and time. She has normal strength.  Skin: Skin is warm, dry and intact. She is not diaphoretic. No cyanosis. No pallor. Nails show no clubbing.  Psychiatric: Her speech is normal. Judgment and thought content normal. Her mood appears anxious. Her affect is blunt. She is slowed. Cognition and memory are impaired. She exhibits abnormal recent memory and abnormal remote memory.  Nursing note and vitals reviewed.   Labs reviewed: Recent Labs    05/21/16 1300 07/13/16 0710  NA 142 140  K 3.7 3.9  CL 105 106  CO2 25 26  GLUCOSE 158* 128*  BUN 23* 25*  CREATININE 0.96 0.80  CALCIUM 9.1 9.0  MG 1.7  --    Recent Labs    05/21/16 1300 07/13/16 0710  AST 25 15  ALT 18 14  ALKPHOS 47 46  BILITOT 0.5 0.6  PROT 6.7 6.4*  ALBUMIN 3.9 3.6   Recent Labs    05/21/16 1300 07/13/16 0710  WBC 8.9 9.2  NEUTROABS 5.7 4.7  HGB 14.3 13.6  HCT 44.2 40.4  MCV 91.2 89.1  PLT 292 308   Lab Results  Component Value Date   TSH 1.359 05/21/2016   Lab Results  Component Value Date   HGBA1C 6.8 (H) 05/21/2016   Lab Results  Component Value Date   CHOL 170 10/21/2015   HDL 40 (L) 10/21/2015   LDLCALC 92 10/21/2015   TRIG 188 (H) 10/21/2015   CHOLHDL 4.3 10/21/2015    Significant Diagnostic Results in last 30 days:  No results found.  Assessment/Plan Osteoarthritis of left hip, unspecified osteoarthritis  type  X-rays of the left hip and knee  Give one-time dose of Roxanol 20 mg/ML-0.25 mL p.o. x1 now for severe pain  Schedule hydrocodone/acetaminophen 5/325 mg 1 tablet p.o. 3 times daily  Continue hydrocodone/acetaminophen 5/325 mg 1 tablet p.o. every 4 hours as needed pain  Continue gabapentin 300 mg p.o. nightly  Continue gabapentin 100 mg p.o. every morning  Continue Aspercreme 10% topical cream applied thin film to the hip or thigh 3 times daily as needed  Recheck as needed  Family/ staff Communication:   Total Time:  Documentation:  Face to Face:  Family/Phone:   Labs/tests ordered: Complete view x-rays of the left hip and left knee  Medication list reviewed and assessed for continued appropriateness.  Vikki Ports, NP-C Geriatrics [facility] [phone] [place], [place] [address] Cell Phone (Mon-Fri 8am-5pm):  [phone] On Call:  [phone] & follow prompts after 5pm & weekends Office Phone:  [phone] Office Fax:  [phone]

## 2017-01-17 ENCOUNTER — Non-Acute Institutional Stay (SKILLED_NURSING_FACILITY): Payer: Medicare Other | Admitting: Gerontology

## 2017-01-17 ENCOUNTER — Encounter: Payer: Self-pay | Admitting: Gerontology

## 2017-01-17 DIAGNOSIS — J449 Chronic obstructive pulmonary disease, unspecified: Secondary | ICD-10-CM | POA: Diagnosis not present

## 2017-01-17 DIAGNOSIS — K219 Gastro-esophageal reflux disease without esophagitis: Secondary | ICD-10-CM | POA: Diagnosis not present

## 2017-01-17 DIAGNOSIS — J309 Allergic rhinitis, unspecified: Secondary | ICD-10-CM

## 2017-01-22 ENCOUNTER — Encounter
Admission: RE | Admit: 2017-01-22 | Discharge: 2017-01-22 | Disposition: A | Discharge status: Home / Self Care | Payer: Medicare Other | Source: Ambulatory Visit | Attending: Internal Medicine | Admitting: Internal Medicine

## 2017-02-04 NOTE — Assessment & Plan Note (Signed)
Stable.  No treatment at this time

## 2017-02-04 NOTE — Assessment & Plan Note (Signed)
Stable.  No complaints of worsening symptoms as of late.

## 2017-02-04 NOTE — Assessment & Plan Note (Signed)
Blood pressure stable.  No episodes of hypotension.

## 2017-02-04 NOTE — Assessment & Plan Note (Signed)
Stable.  On a regular consistency diet.  No known recent episodes of aspiration.

## 2017-02-04 NOTE — Assessment & Plan Note (Signed)
Stable.  Patient continues on omeprazole 20 mg p.o. twice daily, as well as Maalox 15 mL p.o. 3 times daily (with meals) and every 4 hours as needed.  Patient denies symptoms of reflux or cough

## 2017-02-04 NOTE — Progress Notes (Signed)
Location:    Nursing Home Room Number: 316A Place of Service:  SNF (31) Provider:  Toni Arthurs, NP-C  Kirk Ruths, MD  Patient Care Team: Kirk Ruths, MD as PCP - General (Internal Medicine) Einar Pheasant, MD (Internal Medicine)  Extended Emergency Contact Information Primary Emergency Contact: Duwayne Heck Address: [address]          [place], [place] [address] [name] of [place] Phone: [phone] Relation: None Secondary Emergency Contact: [name] Address: [address]          [place], [place] [address] Home Phone: [phone] Relation: None  Code Status: DNR Goals of care: Advanced Directive information Advanced Directives 01/17/2017  Does Patient Have a Medical Advance Directive? Yes  Type of Advance Directive Out of facility DNR (pink MOST or yellow form)  Does patient want to make changes to medical advance directive? No - Patient declined  Copy of [place] in Chart? -     Chief Complaint  Patient presents with  . Medical Management of Chronic Issues    Routine Visit    HPI:  Pt is a 84 y.o. female seen today for medical management of chronic diseases.    Essential (primary) hypertension Blood pressure stable.  No episodes of hypotension.  Lung cancer Stable.  No treatment at this time  Dysphagia, oropharyngeal phase Stable.  On a regular consistency diet.  No known recent episodes of aspiration.  Cellulitis of toe right foot Patient complaint of some pain/tenderness with touch.  Family and staff noticed right third toe with redness and mild warmth.  No evidence of trauma.  No bleeding or drainage.  Positive right pedal pulse.  Past Medical History:  Diagnosis Date  . Anemia   . Atrial fibrillation ([place])    post op, converted to NSR wtih amiodarone  . Bladder cancer ([place]) 2003   s/p resection and chemotherapy, followed by Dr Jacqlyn Larsen  . COPD (chronic obstructive pulmonary disease) ([place])   .  Dementia   . Diabetes mellitus ([place])   . Diabetes mellitus without complication ([place])   . GERD (gastroesophageal reflux disease)   . Hypercholesterolemia   . Hyperlipemia   . Hypertension   . Hypothyroidism   . Inner ear disease   . Lung cancer ([place])    squamous cell s/p left upper lobe llingulectomy 12/08/04 - Dr Arlyce Dice   Past Surgical History:  Procedure Laterality Date  . BLADDER SURGERY  2003   for bladder cancer - Dr Jacqlyn Larsen  . CHOLECYSTECTOMY  1986  . LUNG CANCER SURGERY  12/08/04   s/p left upper lobe lingulectomy - Dr Arlyce Dice    Allergies  Allergen Reactions  . Lovenox [Enoxaparin Sodium]   . Novolog [Insulin Aspart]     Allergies as of 12/18/2016      Reactions   Lovenox [enoxaparin Sodium]    Novolog [insulin Aspart]       Medication List        Accurate as of 12/18/16 11:59 PM. Always use your most recent med list.          acetaminophen 650 MG CR tablet Commonly known as:  TYLENOL Take 650 mg by mouth 3 (three) times daily. Do not exceed 3000 mg in 24 hr   bisacodyl 10 MG suppository Commonly known as:  DULCOLAX Place 10 mg rectally daily as needed for mild constipation or moderate constipation. *If Milk of Magnesia does not resolve constipation.*   feeding supplement (GLUCERNA SHAKE) Liqd Take  237 mLs by mouth 3 (three) times daily with meals. Resident prefers vanilla   gabapentin 100 MG capsule Commonly known as:  NEURONTIN Take 100 mg by mouth daily. 9 am   gabapentin 300 MG capsule Commonly known as:  NEURONTIN Take 300 mg by mouth at bedtime.   glimepiride 2 MG tablet Commonly known as:  AMARYL Take 3 mg by mouth 2 (two) times daily. 1 and 1/2 tablet = 3 mg   HYDROcodone-acetaminophen 5-325 MG tablet Commonly known as:  NORCO/VICODIN Take 1 tablet by mouth every 4 (four) hours as needed. Do not exceed 3000 mg of tylenol in 24 hours. Consider all sources.   ipratropium-albuterol 0.5-2.5 (3) MG/3ML Soln Commonly known as:  DUONEB Take 3  mLs by nebulization 3 (three) times daily.   ipratropium-albuterol 0.5-2.5 (3) MG/3ML Soln Commonly known as:  DUONEB Take 3 mLs by nebulization every 6 (six) hours as needed.   Lidocaine 4 % Ptch Apply 1 patch topically daily. Apply at 9 am to area on lower back daily. Remove after 12 hours   losartan 50 MG tablet Commonly known as:  COZAAR Take 50 mg by mouth daily.   MAG-AL PLUS XS 063-016-01 MG/5ML suspension Generic drug:  alum & mag hydroxide-simeth Take 15 mLs by mouth 4 (four) times daily. Give with or right after meals and at bedtime; May also give 30 ml every 4 hours as needed   magnesium hydroxide 400 MG/5ML suspension Commonly known as:  MILK OF MAGNESIA Take 30 mLs by mouth every 4 (four) hours as needed for mild constipation. If Constipation/ no BM for 2 days.  *no results in 24 hours may administer bisacodyl suppository*   Melatonin 3 MG Tabs Take 6 mg by mouth at bedtime. 2 tabs   metoprolol succinate 25 MG 24 hr tablet Commonly known as:  TOPROL-XL Take 25 mg by mouth daily.   mirtazapine 15 MG tablet Commonly known as:  REMERON Take 7.5 mg by mouth at bedtime. 1/2 tab   montelukast 10 MG tablet Commonly known as:  SINGULAIR Take 10 mg by mouth at bedtime.   nystatin powder Commonly known as:  MYCOSTATIN/NYSTOP Apply thin film of powder topically underneath each breast every shift until healed for redness/itching.   nystatin cream Commonly known as:  MYCOSTATIN Apply thin film in folds of stomach and thigh areas 2 times daily until areas are healed   omeprazole 20 MG capsule Commonly known as:  PRILOSEC Take 20 mg by mouth 2 (two) times daily.   polyethylene glycol packet Commonly known as:  MIRALAX / GLYCOLAX Take 17 g by mouth daily.   rOPINIRole 0.25 MG tablet Commonly known as:  REQUIP Take 1 tablet (0.25 mg total) by mouth at bedtime.   sennosides-docusate sodium 8.6-50 MG tablet Commonly known as:  SENOKOT-S Take 1 tablet by mouth every  other day.   torsemide 5 MG tablet Commonly known as:  DEMADEX Take 5 mg by mouth daily.   trolamine salicylate 10 % cream Commonly known as:  ASPERCREME Apply 1 application topically 3 (three) times daily as needed for muscle pain. Apply and massage on the right hip/outer thigh.       Review of Systems  Unable to perform ROS: Dementia  Constitutional: Negative for activity change, appetite change, chills, diaphoresis and fever.  HENT: Negative for congestion, mouth sores, nosebleeds, postnasal drip, sneezing, sore throat, trouble swallowing and voice change.   Respiratory: Negative for apnea, cough, choking, chest tightness, shortness of breath and wheezing.  Cardiovascular: Negative for chest pain, palpitations and leg swelling.  Gastrointestinal: Negative for abdominal distention, abdominal pain, constipation, diarrhea and nausea.  Genitourinary: Negative for difficulty urinating, dysuria, frequency and urgency.  Musculoskeletal: Positive for arthralgias (typical arthritis) and joint swelling. Negative for back pain, gait problem and myalgias.  Skin: Negative for color change, pallor, rash and wound.  Neurological: Negative for dizziness, tremors, syncope, speech difficulty, weakness, numbness and headaches.  Psychiatric/Behavioral: Negative for agitation and behavioral problems.  All other systems reviewed and are negative.   Immunization History  Administered Date(s) Administered  . Influenza Split 12/05/2011  . Influenza-Unspecified 10/21/2013, 10/08/2014, 10/08/2015, 10/20/2016  . PPD Test 11/05/2015, 10/22/2016   Pertinent  Health Maintenance Due  Topic Date Due  . FOOT EXAM  12/05/1943  . OPHTHALMOLOGY EXAM  12/05/1943  . DEXA SCAN  12/05/2000  . PNA vac Low Risk Adult (1 of 2 - PCV13) 12/05/2000  . HEMOGLOBIN A1C  11/20/2016  . INFLUENZA VACCINE  Completed   No flowsheet data found. Functional Status Survey:    Vitals:   12/18/16 1451  BP: (!) 145/51    Pulse: 71  Resp: 18  Temp: 97.9 F (36.6 C)  TempSrc: Oral  SpO2: 100%  Weight: 141 lb 11.2 oz (64.3 kg)  Height: 5\' 2"  (1.575 m)   Body mass index is 25.92 kg/m. Physical Exam  Constitutional: She is oriented to person, place, and time. Vital signs are normal. She appears well-developed and well-nourished. She is active and cooperative. She does not appear ill. No distress.  HENT:  Head: Normocephalic and atraumatic.  Mouth/Throat: Uvula is midline, oropharynx is clear and moist and mucous membranes are normal. Mucous membranes are not pale, not dry and not cyanotic.  Eyes: Conjunctivae, EOM and lids are normal. Pupils are equal, round, and reactive to light.  Neck: Trachea normal, normal range of motion and full passive range of motion without pain. Neck supple. No JVD present. No tracheal deviation, no edema and no erythema present. No thyromegaly present.  Cardiovascular: Normal rate, regular rhythm, normal heart sounds, intact distal pulses and normal pulses. Exam reveals no gallop, no distant heart sounds and no friction rub.  No murmur heard. Pulses:      Dorsalis pedis pulses are 2+ on the right side, and 2+ on the left side.  No edema  Pulmonary/Chest: Effort normal and breath sounds normal. No accessory muscle usage. No respiratory distress. She has no decreased breath sounds. She has no wheezes. She has no rhonchi. She has no rales. She exhibits no tenderness.  Abdominal: Soft. Normal appearance and bowel sounds are normal. She exhibits no distension and no ascites. There is no tenderness.  Musculoskeletal: Normal range of motion. She exhibits no edema or tenderness.  Expected osteoarthritis, stiffness; Bilateral Calves soft, supple. Negative Homan's Sign. B- pedal pulses equal  Neurological: She is alert and oriented to person, place, and time. She has normal strength.  Skin: Skin is warm, dry and intact. She is not diaphoretic. No cyanosis. No pallor. Nails show no  clubbing.  Right third toe with mild cellulitis  Psychiatric: She has a normal mood and affect. Judgment and thought content normal. Her speech is delayed. She is slowed. Cognition and memory are impaired. She exhibits abnormal recent memory and abnormal remote memory.  Nursing note and vitals reviewed.   Labs reviewed: Recent Labs    05/21/16 1300 07/13/16 0710  NA 142 140  K 3.7 3.9  CL 105 106  CO2 25 26  GLUCOSE 158* 128*  BUN 23* 25*  CREATININE 0.96 0.80  CALCIUM 9.1 9.0  MG 1.7  --    Recent Labs    05/21/16 1300 07/13/16 0710  AST 25 15  ALT 18 14  ALKPHOS 47 46  BILITOT 0.5 0.6  PROT 6.7 6.4*  ALBUMIN 3.9 3.6   Recent Labs    05/21/16 1300 07/13/16 0710  WBC 8.9 9.2  NEUTROABS 5.7 4.7  HGB 14.3 13.6  HCT 44.2 40.4  MCV 91.2 89.1  PLT 292 308   Lab Results  Component Value Date   TSH 1.359 05/21/2016   Lab Results  Component Value Date   HGBA1C 6.8 (H) 05/21/2016   Lab Results  Component Value Date   CHOL 170 10/21/2015   HDL 40 (L) 10/21/2015   LDLCALC 92 10/21/2015   TRIG 188 (H) 10/21/2015   CHOLHDL 4.3 10/21/2015    Significant Diagnostic Results in last 30 days:  No results found.  Assessment/Plan Jacie was seen today for medical management of chronic issues.  Diagnoses and all orders for this visit:  Essential (primary) hypertension  Continue current regimen  Malignant neoplasm of lung, unspecified laterality, unspecified part of lung ([place])  Continue to monitor  Patient remains on hospice services  Dysphagia, oropharyngeal phase  Continue regular consistency diet  Assist with meals as needed  Cellulitis of toe of right foot  Soak right foot in warm Epsom salt water daily  Dry well  2 x 2 gauze in between toes   Family/ staff Communication:   Total Time:  Documentation:  Face to Face:  Family/Phone:   Labs/tests ordered: None at this time  Medication list reviewed and assessed for continued  appropriateness. Monthly medication orders reviewed and signed.  Vikki Ports, NP-C Geriatrics [facility] [phone] [place],  [address] Cell Phone (Mon-Fri 8am-5pm):  [phone] On Call:  [phone] & follow prompts after 5pm & weekends Office Phone:  [phone] Office Fax:  [phone]

## 2017-02-04 NOTE — Progress Notes (Signed)
Location:    Nursing Home Room Number: 316A Place of Service:  SNF (31) Provider:  Toni Arthurs, NP-C  Kirk Ruths, MD  Patient Care Team: Kirk Ruths, MD as PCP - General (Internal Medicine) Einar Pheasant, MD (Internal Medicine)  Extended Emergency Contact Information Primary Emergency Contact: Duwayne Heck Address: [address]          [place], [place] [address] [name] of [place] Phone: [phone] Relation: None Secondary Emergency Contact: [name] Address: [address]          [place], [place] [address] Home Phone: [phone] Relation: None  Code Status: DNR Goals of care: Advanced Directive information Advanced Directives 01/17/2017  Does Patient Have a Medical Advance Directive? Yes  Type of Advance Directive Out of facility DNR (pink MOST or yellow form)  Does patient want to make changes to medical advance directive? No - Patient declined  Copy of [place] in Chart? -     Chief Complaint  Patient presents with  . Medical Management of Chronic Issues    Routine Visit    HPI:  Pt is a 84 y.o. female seen today for medical management of chronic diseases.    Chronic obstructive pulmonary disease (HCC) Stable.  No recent exacerbations.  Patient remains on 3 times daily scheduled DuoNeb's due to inability to effectively use MDI.  Also on Singulair p.o.  Patient denies dyspnea.  O2 saturations have remained stable.  Allergic rhinitis Stable.  No complaints of worsening symptoms as of late.  Gastroesophageal reflux disease without esophagitis Stable.  Patient continues on omeprazole 20 mg p.o. twice daily, as well as Maalox 15 mL p.o. 3 times daily (with meals) and every 4 hours as needed.  Patient denies symptoms of reflux or cough  Patient was also recently discharged from hospice services due to stability.  Will obtain biannual routine labs this coming month.  Palliative care to resume services for  patient.  Patient has very limited verbal ability.  Unable to give complete ROS.   Past Medical History:  Diagnosis Date  . Anemia   . Atrial fibrillation ([place])    post op, converted to NSR wtih amiodarone  . Bladder cancer ([place]) 2003   s/p resection and chemotherapy, followed by Dr Jacqlyn Larsen  . COPD (chronic obstructive pulmonary disease) ([place])   . Dementia   . Diabetes mellitus ([place])   . Diabetes mellitus without complication ([place])   . GERD (gastroesophageal reflux disease)   . Hypercholesterolemia   . Hyperlipemia   . Hypertension   . Hypothyroidism   . Inner ear disease   . Lung cancer ([place])    squamous cell s/p left upper lobe llingulectomy 12/08/04 - Dr Arlyce Dice   Past Surgical History:  Procedure Laterality Date  . BLADDER SURGERY  2003   for bladder cancer - Dr Jacqlyn Larsen  . CHOLECYSTECTOMY  1986  . LUNG CANCER SURGERY  12/08/04   s/p left upper lobe lingulectomy - Dr Arlyce Dice    Allergies  Allergen Reactions  . Lovenox [Enoxaparin Sodium]   . Novolog [Insulin Aspart]     Allergies as of 01/17/2017      Reactions   Lovenox [enoxaparin Sodium]    Novolog [insulin Aspart]       Medication List        Accurate as of 01/17/17 11:59 PM. Always use your most recent med list.          bisacodyl 10 MG suppository Commonly known as:  DULCOLAX Place 10 mg rectally daily as needed for mild constipation or moderate constipation. *If Milk of Magnesia does not resolve constipation.*   feeding supplement (GLUCERNA SHAKE) Liqd Take 237 mLs by mouth 3 (three) times daily with meals. Resident prefers vanilla   gabapentin 100 MG capsule Commonly known as:  NEURONTIN Take 100 mg by mouth daily. 9 am   gabapentin 300 MG capsule Commonly known as:  NEURONTIN Take 300 mg by mouth at bedtime.   glimepiride 2 MG tablet Commonly known as:  AMARYL Take 3 mg by mouth 2 (two) times daily. 1 and 1/2 tablet = 3 mg   HYDROcodone-acetaminophen 5-325 MG tablet Commonly known as:   NORCO/VICODIN Take 1 tablet by mouth every 4 (four) hours as needed. Do not exceed 3000 mg of tylenol in 24 hours. Consider all sources.   HYDROcodone-acetaminophen 5-325 MG tablet Commonly known as:  NORCO/VICODIN Take 1 tablet by mouth 3 (three) times daily.   ipratropium-albuterol 0.5-2.5 (3) MG/3ML Soln Commonly known as:  DUONEB Take 3 mLs by nebulization 3 (three) times daily.   ipratropium-albuterol 0.5-2.5 (3) MG/3ML Soln Commonly known as:  DUONEB Take 3 mLs by nebulization every 6 (six) hours as needed.   Lidocaine 4 % Ptch Apply 1 patch topically daily. Apply at 9 am to area on lower back daily. Remove after 12 hours   losartan 50 MG tablet Commonly known as:  COZAAR Take 50 mg by mouth daily.   MAG-AL PLUS XS 600-459-97 MG/5ML suspension Generic drug:  alum & mag hydroxide-simeth Take 15 mLs by mouth 4 (four) times daily. Give with or right after meals and at bedtime; May also give 30 ml every 4 hours as needed   magnesium hydroxide 400 MG/5ML suspension Commonly known as:  MILK OF MAGNESIA Take 30 mLs by mouth every 4 (four) hours as needed for mild constipation. If Constipation/ no BM for 2 days.  *no results in 24 hours may administer bisacodyl suppository*   Melatonin 3 MG Tabs Take 6 mg by mouth at bedtime. 2 tabs   metoprolol succinate 25 MG 24 hr tablet Commonly known as:  TOPROL-XL Take 25 mg by mouth daily.   mirtazapine 15 MG tablet Commonly known as:  REMERON Take 7.5 mg by mouth at bedtime. 1/2 tab   montelukast 10 MG tablet Commonly known as:  SINGULAIR Take 10 mg by mouth at bedtime.   mupirocin cream 2 % Commonly known as:  BACTROBAN Apply 1 application topically daily. Apply to right 3rd toe   nystatin powder Commonly known as:  MYCOSTATIN/NYSTOP Apply thin film of powder topically underneath each breast every shift until healed for redness/itching.   nystatin cream Commonly known as:  MYCOSTATIN Apply thin film in folds of stomach  and thigh areas 2 times daily until areas are healed   omeprazole 20 MG capsule Commonly known as:  PRILOSEC Take 20 mg by mouth 2 (two) times daily.   polyethylene glycol packet Commonly known as:  MIRALAX / GLYCOLAX Take 17 g by mouth daily.   pravastatin 10 MG tablet Commonly known as:  PRAVACHOL Take 10 mg by mouth once a week. On Sunday   rOPINIRole 0.25 MG tablet Commonly known as:  REQUIP Take 1 tablet (0.25 mg total) by mouth at bedtime.   sennosides-docusate sodium 8.6-50 MG tablet Commonly known as:  SENOKOT-S Take 1 tablet by mouth every other day.   torsemide 5 MG tablet Commonly known as:  DEMADEX Take 5 mg by mouth daily.  trolamine salicylate 10 % cream Commonly known as:  ASPERCREME Apply 1 application topically 3 (three) times daily as needed for muscle pain. Apply and massage on the right hip/outer thigh.       Review of Systems  Unable to perform ROS: Dementia  Constitutional: Negative for activity change, appetite change, chills, diaphoresis and fever.  HENT: Negative for congestion, mouth sores, nosebleeds, postnasal drip, sneezing, sore throat, trouble swallowing and voice change.   Respiratory: Negative for apnea, cough, choking, chest tightness, shortness of breath and wheezing.   Cardiovascular: Negative for chest pain, palpitations and leg swelling.  Gastrointestinal: Negative for abdominal distention, abdominal pain, constipation, diarrhea and nausea.  Genitourinary: Negative for difficulty urinating, dysuria, frequency and urgency.  Musculoskeletal: Positive for arthralgias (typical arthritis). Negative for back pain, gait problem and myalgias.  Skin: Negative for color change, pallor, rash and wound.  Neurological: Negative for dizziness, tremors, syncope, speech difficulty, weakness, numbness and headaches.  Psychiatric/Behavioral: Negative for agitation and behavioral problems.  All other systems reviewed and are negative.   Immunization  History  Administered Date(s) Administered  . Influenza Split 12/05/2011  . Influenza-Unspecified 10/21/2013, 10/08/2014, 10/08/2015, 10/20/2016  . PPD Test 11/05/2015, 10/22/2016   Pertinent  Health Maintenance Due  Topic Date Due  . FOOT EXAM  12/05/1943  . OPHTHALMOLOGY EXAM  12/05/1943  . DEXA SCAN  12/05/2000  . PNA vac Low Risk Adult (1 of 2 - PCV13) 12/05/2000  . HEMOGLOBIN A1C  11/20/2016  . INFLUENZA VACCINE  Completed   No flowsheet data found. Functional Status Survey:    Vitals:   01/17/17 1406  BP: (!) 141/66  Pulse: 72  Resp: 16  Temp: 97.6 F (36.4 C)  TempSrc: Oral  SpO2: 96%  Weight: 141 lb 6.4 oz (64.1 kg)  Height: '5\' 2"'$  (1.575 m)   Body mass index is 25.86 kg/m. Physical Exam  Constitutional: She is oriented to person, place, and time. Vital signs are normal. She appears well-developed and well-nourished. She is active and cooperative. She does not appear ill. No distress.  HENT:  Head: Normocephalic and atraumatic.  Mouth/Throat: Uvula is midline, oropharynx is clear and moist and mucous membranes are normal. Mucous membranes are not pale, not dry and not cyanotic.  Eyes: Conjunctivae, EOM and lids are normal. Pupils are equal, round, and reactive to light.  Neck: Trachea normal, normal range of motion and full passive range of motion without pain. Neck supple. No JVD present. No tracheal deviation, no edema and no erythema present. No thyromegaly present.  Cardiovascular: Normal rate, regular rhythm, normal heart sounds, intact distal pulses and normal pulses. Exam reveals no gallop, no distant heart sounds and no friction rub.  No murmur heard. Pulses:      Dorsalis pedis pulses are 2+ on the right side, and 2+ on the left side.  No edema  Pulmonary/Chest: Effort normal and breath sounds normal. No accessory muscle usage. No respiratory distress. She has no decreased breath sounds. She has no wheezes. She has no rhonchi. She has no rales. She  exhibits no tenderness.  Abdominal: Soft. Normal appearance and bowel sounds are normal. She exhibits no distension and no ascites. There is no tenderness.  Musculoskeletal: Normal range of motion. She exhibits no edema or tenderness.  Expected osteoarthritis, stiffness; Bilateral Calves soft, supple. Negative Homan's Sign. B- pedal pulses equal  Neurological: She is alert and oriented to person, place, and time. She has normal strength.  Skin: Skin is warm, dry and intact. She  is not diaphoretic. No cyanosis. No pallor. Nails show no clubbing.  Psychiatric: She has a normal mood and affect. Her speech is normal and behavior is normal. Judgment and thought content normal. Cognition and memory are normal.  Nursing note and vitals reviewed.   Labs reviewed: Recent Labs    05/21/16 1300 07/13/16 0710  NA 142 140  K 3.7 3.9  CL 105 106  CO2 25 26  GLUCOSE 158* 128*  BUN 23* 25*  CREATININE 0.96 0.80  CALCIUM 9.1 9.0  MG 1.7  --    Recent Labs    05/21/16 1300 07/13/16 0710  AST 25 15  ALT 18 14  ALKPHOS 47 46  BILITOT 0.5 0.6  PROT 6.7 6.4*  ALBUMIN 3.9 3.6   Recent Labs    05/21/16 1300 07/13/16 0710  WBC 8.9 9.2  NEUTROABS 5.7 4.7  HGB 14.3 13.6  HCT 44.2 40.4  MCV 91.2 89.1  PLT 292 308   Lab Results  Component Value Date   TSH 1.359 05/21/2016   Lab Results  Component Value Date   HGBA1C 6.8 (H) 05/21/2016   Lab Results  Component Value Date   CHOL 170 10/21/2015   HDL 40 (L) 10/21/2015   LDLCALC 92 10/21/2015   TRIG 188 (H) 10/21/2015   CHOLHDL 4.3 10/21/2015    Significant Diagnostic Results in last 30 days:  No results found.  Assessment/Plan Raynelle was seen today for medical management of chronic issues.  Diagnoses and all orders for this visit:  Chronic obstructive pulmonary disease, unspecified COPD type ([place])  Stable  Continue current medication regimen  Monitor oxygen saturations with routine vitals  Allergic rhinitis,  unspecified seasonality, unspecified trigger  Stable  No current treatment  Gastroesophageal reflux disease without esophagitis  Stable  Continue current medication regimen   Family/ staff Communication:   Total Time:  Documentation:  Face to Face:  Family/Phone:   Labs/tests ordered: CBC, met C, TSH, vitamin D, vitamin B12, magnesium, A1c, lipid panel  Medication list reviewed and assessed for continued appropriateness. Monthly medication orders reviewed and signed.  Vikki Ports, NP-C Geriatrics [facility] [phone], [place] [address] Cell Phone (Mon-Fri 8am-5pm):  [phone] On Call:  [phone] & follow prompts after 5pm & weekends Office Phone:  [phone] Office Fax:  [phone]

## 2017-02-04 NOTE — Assessment & Plan Note (Signed)
Stable.  No recent exacerbations.  Patient remains on 3 times daily scheduled DuoNeb's due to inability to effectively use MDI.  Also on Singulair p.o.  Patient denies dyspnea.  O2 saturations have remained stable.

## 2017-02-05 ENCOUNTER — Other Ambulatory Visit
Admission: RE | Admit: 2017-02-05 | Discharge: 2017-02-05 | Disposition: A | Discharge status: Home / Self Care | Payer: Medicare Other | Source: Ambulatory Visit | Attending: Gerontology | Admitting: Gerontology

## 2017-02-05 DIAGNOSIS — E119 Type 2 diabetes mellitus without complications: Secondary | ICD-10-CM | POA: Diagnosis not present

## 2017-02-05 LAB — COMPREHENSIVE METABOLIC PANEL
ALT: 16 U/L (ref 14–54)
AST: 19 U/L (ref 15–41)
Albumin: 3.1 g/dL — ABNORMAL LOW (ref 3.5–5.0)
Alkaline Phosphatase: 57 U/L (ref 38–126)
Anion gap: 11 (ref 5–15)
BUN: 38 mg/dL — ABNORMAL HIGH (ref 6–20)
CHLORIDE: 103 mmol/L (ref 101–111)
CO2: 29 mmol/L (ref 22–32)
CREATININE: 1.08 mg/dL — AB (ref 0.44–1.00)
Calcium: 8.8 mg/dL — ABNORMAL LOW (ref 8.9–10.3)
GFR calc non Af Amer: 47 mL/min — ABNORMAL LOW (ref >60)
GFR, EST AFRICAN AMERICAN: 54 mL/min — AB (ref >60)
Glucose, Bld: 175 mg/dL — ABNORMAL HIGH (ref 65–99)
POTASSIUM: 3.9 mmol/L (ref 3.5–5.1)
Sodium: 143 mmol/L (ref 135–145)
TOTAL PROTEIN: 5.7 g/dL — AB (ref 6.5–8.1)
Total Bilirubin: 0.6 mg/dL (ref 0.3–1.2)

## 2017-02-05 LAB — CBC WITH DIFFERENTIAL/PLATELET
Basophils Absolute: 0.1 10*3/uL (ref 0–0.1)
Basophils Relative: 1 %
EOS PCT: 4 %
Eosinophils Absolute: 0.2 10*3/uL (ref 0–0.7)
HCT: 39.9 % (ref 35.0–47.0)
Hemoglobin: 13 g/dL (ref 12.0–16.0)
LYMPHS ABS: 2.2 10*3/uL (ref 1.0–3.6)
LYMPHS PCT: 36 %
MCH: 29.4 pg (ref 26.0–34.0)
MCHC: 32.6 g/dL (ref 32.0–36.0)
MCV: 90.4 fL (ref 80.0–100.0)
MONO ABS: 0.7 10*3/uL (ref 0.2–0.9)
MONOS PCT: 11 %
Neutro Abs: 2.9 10*3/uL (ref 1.4–6.5)
Neutrophils Relative %: 48 %
PLATELETS: 232 10*3/uL (ref 150–440)
RBC: 4.42 MIL/uL (ref 3.80–5.20)
RDW: 13.4 % (ref 11.5–14.5)
WBC: 6 10*3/uL (ref 3.6–11.0)

## 2017-02-05 LAB — LIPID PANEL
CHOL/HDL RATIO: 5.5 ratio
CHOLESTEROL: 180 mg/dL (ref 0–200)
HDL: 33 mg/dL — ABNORMAL LOW (ref >40)
LDL Cholesterol: 93 mg/dL (ref 0–99)
TRIGLYCERIDES: 271 mg/dL — AB (ref <150)
VLDL: 54 mg/dL — AB (ref 0–40)

## 2017-02-05 LAB — HEMOGLOBIN A1C
Hgb A1c MFr Bld: 8.4 % — ABNORMAL HIGH (ref 4.8–5.6)
MEAN PLASMA GLUCOSE: 194.38 mg/dL

## 2017-02-05 LAB — TSH: TSH: 1.83 u[IU]/mL (ref 0.350–4.500)

## 2017-02-05 LAB — MAGNESIUM: Magnesium: 3.1 mg/dL — ABNORMAL HIGH (ref 1.7–2.4)

## 2017-02-05 LAB — VITAMIN B12: Vitamin B-12: 503 pg/mL (ref 180–914)

## 2017-02-06 LAB — VITAMIN D 25 HYDROXY (VIT D DEFICIENCY, FRACTURES): Vit D, 25-Hydroxy: 34.9 ng/mL (ref 30.0–100.0)

## 2017-02-12 DIAGNOSIS — M25521 Pain in right elbow: Secondary | ICD-10-CM | POA: Diagnosis not present

## 2017-02-12 DIAGNOSIS — M79621 Pain in right upper arm: Secondary | ICD-10-CM | POA: Diagnosis not present

## 2017-02-12 DIAGNOSIS — M25511 Pain in right shoulder: Secondary | ICD-10-CM | POA: Diagnosis not present

## 2017-02-12 DIAGNOSIS — M25531 Pain in right wrist: Secondary | ICD-10-CM | POA: Diagnosis not present

## 2017-02-12 DIAGNOSIS — W19XXXA Unspecified fall, initial encounter: Secondary | ICD-10-CM | POA: Diagnosis not present

## 2017-02-12 DIAGNOSIS — M79631 Pain in right forearm: Secondary | ICD-10-CM | POA: Diagnosis not present

## 2017-02-13 ENCOUNTER — Non-Acute Institutional Stay (SKILLED_NURSING_FACILITY): Payer: Medicare Other | Admitting: Gerontology

## 2017-02-13 ENCOUNTER — Encounter: Payer: Self-pay | Admitting: Gerontology

## 2017-02-13 DIAGNOSIS — S42211A Unspecified displaced fracture of surgical neck of right humerus, initial encounter for closed fracture: Secondary | ICD-10-CM

## 2017-02-13 DIAGNOSIS — K5900 Constipation, unspecified: Secondary | ICD-10-CM | POA: Diagnosis not present

## 2017-02-13 DIAGNOSIS — W19XXXA Unspecified fall, initial encounter: Secondary | ICD-10-CM | POA: Diagnosis not present

## 2017-02-13 DIAGNOSIS — M25511 Pain in right shoulder: Secondary | ICD-10-CM | POA: Diagnosis not present

## 2017-02-13 DIAGNOSIS — S42291A Other displaced fracture of upper end of right humerus, initial encounter for closed fracture: Secondary | ICD-10-CM | POA: Diagnosis not present

## 2017-02-13 DIAGNOSIS — S42213A Unspecified displaced fracture of surgical neck of unspecified humerus, initial encounter for closed fracture: Secondary | ICD-10-CM | POA: Insufficient documentation

## 2017-02-13 NOTE — Progress Notes (Signed)
Location:   [facility] Room Number: [place] of Service:  SNF [phone]) Provider:  Toni Arthurs, NP-C  Kirk Ruths, MD  Patient Care Team: Kirk Ruths, MD as PCP - General (Internal Medicine) Einar Pheasant, MD (Internal Medicine)  Extended Emergency Contact Information Primary Emergency Contact: Duwayne Heck Address: [address]          [place], [place] [address] [name] of [place] Phone: [phone] Relation: None Secondary Emergency Contact: [name] Address: [address]          [place], [place] [address] Home Phone: [phone] Relation: None  Code Status:  DNR Goals of care: Advanced Directive information Advanced Directives 02/13/2017  Does Patient Have a Medical Advance Directive? Yes  Type of Advance Directive Out of facility DNR (pink MOST or yellow form)  Does patient want to make changes to medical advance directive? No - Patient declined  Copy of [place] in Chart? -  Pre-existing out of facility DNR order (yellow form or pink MOST form) Yellow form placed in chart (order not valid for inpatient use)     Chief Complaint  Patient presents with  . Acute Visit    Recheck humerus fracture    HPI:  Pt is a 84 y.o. female seen today for an acute visit for humerus fracture.  Patient got up out of the chair unassisted yesterday to go to the restroom and fell.  Patient was found laying on the floor on her right side.  Patient has a history of right-sided weakness and Alzheimer's.  Patient has progressively been becoming weaker.  On initial exam after fall, patient denied pain.  No obvious deformities.  Patient moves all extremities x4.  No obvious bruising, per nursing documentation.  However, as the day progressed, patient began having worsening pain, crying, and some agitation.  I was notified by nursing staff and gave order for x-rays of the right shoulder, humerus, elbow, forearm and wrist.   Patient is minimally verbal and unable to give specific qualities of pain or location.  Patient was grimacing and was able to verbalize to nursing "worst pain" with indication of the right upper extremity.  X-rays showed patient has a right humerus surgical neck fracture that is mildly displaced.  Patient has been kept comfortable with pain medications and ice pack.  Patient is scheduled to have an urgent orthopedic appointment today for evaluation.  X-ray report and images printed off and given to nursing to send to orthopedics.  On exam, patient was lying very still in bed.  She appears to be sleeping, but her brows were forrowed with a facial grimace.  Ice pack in place to the right upper arm.  Bilateral radial pulses equal and strong.  Fingers warm.  Cap refills WNL.  Small blue bruise to the right humerus.  Had lengthy discussion with patient's daughters and sister regarding pain management and potential treatment options.  Due to her increased weakness and decreased mobility and the fracture, the decision was made to insert indwelling Foley catheter for now for comfort and safety.  Family would like to have a urinalysis done as family states patient has been slightly more confused and having more urinary frequency and urgency this past week.  They also state she has been having multiple loose stools for several weeks.  They would like her bowel regimen adjusted.  Patient is being followed by palliative medicine.  She was recently discharged from hospice services for  stability.  Will have palliative medicine evaluate patient for possible hospice re-eligibility due to acute fracture.  Vital signs stable.  No other complaints.  Please note pt with limited verbal ability. Unable to obtain complete ROS. Some ROS info obtained from staff and documentation.      Past Medical History:  Diagnosis Date  . Anemia   . Atrial fibrillation ([place])    post op, converted to NSR wtih amiodarone  . Bladder cancer  ([place]) 2003   s/p resection and chemotherapy, followed by Dr Jacqlyn Larsen  . COPD (chronic obstructive pulmonary disease) ([place])   . Dementia   . Diabetes mellitus ([place])   . Diabetes mellitus without complication ([place])   . GERD (gastroesophageal reflux disease)   . Hypercholesterolemia   . Hyperlipemia   . Hypertension   . Hypothyroidism   . Inner ear disease   . Lung cancer ([place])    squamous cell s/p left upper lobe llingulectomy 12/08/04 - Dr Arlyce Dice   Past Surgical History:  Procedure Laterality Date  . BLADDER SURGERY  2003   for bladder cancer - Dr Jacqlyn Larsen  . CHOLECYSTECTOMY  1986  . LUNG CANCER SURGERY  12/08/04   s/p left upper lobe lingulectomy - Dr Arlyce Dice    Allergies  Allergen Reactions  . Lovenox [Enoxaparin Sodium]   . Novolog [Insulin Aspart]     Allergies as of 02/13/2017      Reactions   Lovenox [enoxaparin Sodium]    Novolog [insulin Aspart]       Medication List        Accurate as of 02/13/17  1:22 PM. Always use your most recent med list.          bisacodyl 10 MG suppository Commonly known as:  DULCOLAX Place 10 mg rectally daily as needed for mild constipation or moderate constipation. *If Milk of Magnesia does not resolve constipation.*   feeding supplement (GLUCERNA SHAKE) Liqd Take 237 mLs by mouth 2 (two) times daily between meals. Resident prefers vanilla   gabapentin 100 MG capsule Commonly known as:  NEURONTIN Take 100 mg by mouth daily. 9 am   gabapentin 300 MG capsule Commonly known as:  NEURONTIN Take 300 mg by mouth at bedtime.   glimepiride 2 MG tablet Commonly known as:  AMARYL Take 3 mg by mouth 2 (two) times daily. 1 and 1/2 tablet = 3 mg   HYDROcodone-acetaminophen 5-325 MG tablet Commonly known as:  NORCO/VICODIN Take 1 tablet by mouth every 4 (four) hours as needed. Do not exceed 3000 mg of tylenol in 24 hours. Consider all sources.   HYDROcodone-acetaminophen 5-325 MG tablet Commonly known as:  NORCO/VICODIN Take 1 tablet by  mouth 3 (three) times daily.   ipratropium-albuterol 0.5-2.5 (3) MG/3ML Soln Commonly known as:  DUONEB Take 3 mLs by nebulization 3 (three) times daily.   ipratropium-albuterol 0.5-2.5 (3) MG/3ML Soln Commonly known as:  DUONEB Take 3 mLs by nebulization every 6 (six) hours as needed.   Lidocaine 4 % Ptch Apply 1 patch topically daily. Apply at 9 am to area on lower back daily. Remove after 12 hours   losartan 50 MG tablet Commonly known as:  COZAAR Take 50 mg by mouth daily.   MAG-AL PLUS XS 976-734-19 MG/5ML suspension Generic drug:  alum & mag hydroxide-simeth Take 5 mLs by mouth 3 (three) times daily. Give with or right after meals   magnesium hydroxide 400 MG/5ML suspension Commonly known as:  MILK OF MAGNESIA Take 30 mLs by mouth every  4 (four) hours as needed for mild constipation. If Constipation/ no BM for 2 days.  *no results in 24 hours may administer bisacodyl suppository*   Melatonin 3 MG Tabs Take 6 mg by mouth at bedtime. 2 tabs   metoprolol succinate 25 MG 24 hr tablet Commonly known as:  TOPROL-XL Take 25 mg by mouth daily.   mirtazapine 15 MG tablet Commonly known as:  REMERON Take 7.5 mg by mouth at bedtime. 1/2 tab   montelukast 10 MG tablet Commonly known as:  SINGULAIR Take 10 mg by mouth at bedtime.   mupirocin cream 2 % Commonly known as:  BACTROBAN Apply 1 application topically daily. Apply to right 3rd toe   nystatin powder Commonly known as:  MYCOSTATIN/NYSTOP Apply thin film of powder topically underneath each breast every shift until healed for redness/itching.   nystatin cream Commonly known as:  MYCOSTATIN Apply thin film in folds of stomach and thigh areas 2 times daily until areas are healed   omeprazole 20 MG capsule Commonly known as:  PRILOSEC Take 20 mg by mouth 2 (two) times daily.   polyethylene glycol packet Commonly known as:  MIRALAX / GLYCOLAX Take 17 g by mouth daily.   pravastatin 10 MG tablet Commonly known  as:  PRAVACHOL Take 10 mg by mouth once a week. On Sunday   rOPINIRole 0.25 MG tablet Commonly known as:  REQUIP Take 1 tablet (0.25 mg total) by mouth at bedtime.   sennosides-docusate sodium 8.6-50 MG tablet Commonly known as:  SENOKOT-S Take 1 tablet by mouth daily as needed.   torsemide 5 MG tablet Commonly known as:  DEMADEX Take 5 mg by mouth daily.   trolamine salicylate 10 % cream Commonly known as:  ASPERCREME Apply 1 application topically 3 (three) times daily as needed for muscle pain. Apply and massage on the right hip/outer thigh.       Review of Systems  Unable to perform ROS: Dementia  Constitutional: Negative for activity change, appetite change, chills, diaphoresis and fever.  HENT: Negative for congestion, mouth sores, nosebleeds, postnasal drip, sneezing, sore throat, trouble swallowing and voice change.   Respiratory: Negative for apnea, cough, choking, chest tightness, shortness of breath and wheezing.   Cardiovascular: Negative for chest pain, palpitations and leg swelling.  Gastrointestinal: Negative for abdominal distention, abdominal pain, constipation, diarrhea and nausea.  Genitourinary: Negative for difficulty urinating, dysuria, frequency and urgency.  Musculoskeletal: Positive for arthralgias (typical arthritis), gait problem, joint swelling and myalgias. Negative for back pain.  Skin: Positive for color change. Negative for pallor, rash and wound.  Neurological: Positive for weakness. Negative for dizziness, tremors, syncope, speech difficulty, numbness and headaches.  Psychiatric/Behavioral: Positive for confusion. Negative for agitation and behavioral problems.  All other systems reviewed and are negative.   Immunization History  Administered Date(s) Administered  . Influenza Split 12/05/2011  . Influenza-Unspecified 10/21/2013, 10/08/2014, 10/08/2015, 10/20/2016  . PPD Test 11/05/2015, 10/22/2016   Pertinent  Health Maintenance Due  Topic  Date Due  . FOOT EXAM  12/05/1943  . OPHTHALMOLOGY EXAM  12/05/1943  . DEXA SCAN  12/05/2000  . PNA vac Low Risk Adult (1 of 2 - PCV13) 12/05/2000  . HEMOGLOBIN A1C  08/05/2017  . INFLUENZA VACCINE  Completed   No flowsheet data found. Functional Status Survey:    Vitals:   02/13/17 1301  BP: (!) 107/54  Pulse: 82  Resp: 18  Temp: 99 F (37.2 C)  TempSrc: Oral  SpO2: 95%  Weight: 150 lb  1.6 oz (68.1 kg)  Height: 5\' 2"  (1.575 m)   Body mass index is 27.45 kg/m. Physical Exam  Constitutional: Vital signs are normal. She appears well-developed and well-nourished. She is active and cooperative. She does not appear ill. No distress.  HENT:  Head: Normocephalic and atraumatic.  Mouth/Throat: Uvula is midline, oropharynx is clear and moist and mucous membranes are normal. Mucous membranes are not pale, not dry and not cyanotic.  Eyes: Conjunctivae, EOM and lids are normal. Pupils are equal, round, and reactive to light.  Neck: Trachea normal, normal range of motion and full passive range of motion without pain. Neck supple. No JVD present. No tracheal deviation, no edema and no erythema present. No thyromegaly present.  Cardiovascular: Normal rate, regular rhythm, normal heart sounds, intact distal pulses and normal pulses. Exam reveals no gallop, no distant heart sounds and no friction rub.  No murmur heard. Pulses:      Radial pulses are 2+ on the right side, and 2+ on the left side.       Dorsalis pedis pulses are 2+ on the right side, and 2+ on the left side.  No edema  Pulmonary/Chest: Effort normal and breath sounds normal. No accessory muscle usage. No respiratory distress. She has no decreased breath sounds. She has no wheezes. She has no rhonchi. She has no rales. She exhibits no tenderness.  Abdominal: Soft. Normal appearance and bowel sounds are normal. She exhibits no distension and no ascites. There is no tenderness.  Musculoskeletal: She exhibits no edema.       Right  shoulder: She exhibits decreased range of motion, tenderness, swelling, deformity and pain. She exhibits normal pulse.  Expected osteoarthritis, stiffness; Bilateral Calves soft, supple. Negative Homan's Sign. B- pedal pulses equal;  Right humeral neck fracture  Neurological: She is alert. She has normal strength. She displays atrophy. A cranial nerve deficit and sensory deficit is present. She exhibits abnormal muscle tone. Coordination and gait abnormal.  Skin: Skin is warm, dry and intact. Bruising (small bruise to RUE) noted. She is not diaphoretic. No cyanosis. No pallor. Nails show no clubbing.  Psychiatric: She has a normal mood and affect. Her speech is normal and behavior is normal. Thought content normal. Cognition and memory are impaired. She expresses impulsivity. She exhibits abnormal recent memory and abnormal remote memory.  Nursing note and vitals reviewed.   Labs reviewed: Recent Labs    05/21/16 1300 07/13/16 0710 02/05/17 0530  NA 142 140 143  K 3.7 3.9 3.9  CL 105 106 103  CO2 25 26 29   GLUCOSE 158* 128* 175*  BUN 23* 25* 38*  CREATININE 0.96 0.80 1.08*  CALCIUM 9.1 9.0 8.8*  MG 1.7  --  3.1*   Recent Labs    05/21/16 1300 07/13/16 0710 02/05/17 0530  AST 25 15 19   ALT 18 14 16   ALKPHOS 47 46 57  BILITOT 0.5 0.6 0.6  PROT 6.7 6.4* 5.7*  ALBUMIN 3.9 3.6 3.1*   Recent Labs    05/21/16 1300 07/13/16 0710 02/05/17 0530  WBC 8.9 9.2 6.0  NEUTROABS 5.7 4.7 2.9  HGB 14.3 13.6 13.0  HCT 44.2 40.4 39.9  MCV 91.2 89.1 90.4  PLT 292 308 232   Lab Results  Component Value Date   TSH 1.830 02/05/2017   Lab Results  Component Value Date   HGBA1C 8.4 (H) 02/05/2017   Lab Results  Component Value Date   CHOL 180 02/05/2017   HDL 33 (L) 02/05/2017  LDLCALC 93 02/05/2017   TRIG 271 (H) 02/05/2017   CHOLHDL 5.5 02/05/2017    Significant Diagnostic Results in last 30 days:  No results found.  Assessment/Plan Closed displaced fracture of surgical  neck of right humerus, unspecified fracture morphology, initial encounter  Refer to orthopedist of choice/first available for evaluation  Ice pack as needed for pain and edema  Continue hydrocodone/acetaminophen 5/325 mg 1 tablet p.o. 3 times daily scheduled  Continue hydrocodone-acetaminophen 5/325 mg 1 tablet p.o. every 4 hours as needed breakthrough pain  Give morphine 20 mg/mL 0.25 mL (5 mg) p.o. x1 prior to transport to the orthopedist office for pain control  Insert and maintain indwelling Foley catheter for comfort and safety  Obtain urine sample, send for UA, C and S for symptoms of increased frequency, urgency and confusion  Constipation, unspecified constipation type  Overcorrected  Continue MiraLAX p.o. daily  Change senna S to daily as needed  Monitor for ongoing diarrhea or increased symptoms of constipation   Family/ staff Communication:   Total Time: 55 minutes  Documentation: 10 minutes  Face to Face: 30 minutes  Family/Phone: Family at bedside, plus additional meeting.  15 minutes  Labs/tests ordered: Complete view x-rays of right shoulder, humerus, elbow, forearm, wrist  Medication list reviewed and assessed for continued appropriateness.  Vikki Ports, NP-C Geriatrics [facility] [phone], [place] [address] Cell Phone (Mon-Fri 8am-5pm):  [phone] On Call:  [phone] & follow prompts after 5pm & weekends Office Phone:  [phone] Office Fax:  [phone]

## 2017-02-14 ENCOUNTER — Other Ambulatory Visit
Admission: RE | Admit: 2017-02-14 | Discharge: 2017-02-14 | Disposition: A | Discharge status: Home / Self Care | Payer: Medicare Other | Source: Ambulatory Visit | Attending: Gerontology | Admitting: Gerontology

## 2017-02-14 DIAGNOSIS — R410 Disorientation, unspecified: Secondary | ICD-10-CM | POA: Insufficient documentation

## 2017-02-14 DIAGNOSIS — R350 Frequency of micturition: Secondary | ICD-10-CM | POA: Insufficient documentation

## 2017-02-14 LAB — URINALYSIS, COMPLETE (UACMP) WITH MICROSCOPIC
BACTERIA UA: NONE SEEN
Bilirubin Urine: NEGATIVE
Glucose, UA: >=500 mg/dL — AB
Ketones, ur: 5 mg/dL — AB
NITRITE: NEGATIVE
Protein, ur: NEGATIVE mg/dL
Specific Gravity, Urine: 1.023 (ref 1.005–1.030)
pH: 6 (ref 5.0–8.0)

## 2017-02-15 LAB — URINE CULTURE: Culture: NO GROWTH

## 2017-02-19 DIAGNOSIS — N183 Chronic kidney disease, stage 3 (moderate): Secondary | ICD-10-CM | POA: Diagnosis not present

## 2017-02-19 DIAGNOSIS — E1122 Type 2 diabetes mellitus with diabetic chronic kidney disease: Secondary | ICD-10-CM | POA: Diagnosis not present

## 2017-02-19 DIAGNOSIS — Z0000 Encounter for general adult medical examination without abnormal findings: Secondary | ICD-10-CM | POA: Diagnosis not present

## 2017-02-19 DIAGNOSIS — G301 Alzheimer's disease with late onset: Secondary | ICD-10-CM | POA: Diagnosis not present

## 2017-02-21 ENCOUNTER — Encounter: Payer: Self-pay | Admitting: Gerontology

## 2017-02-21 ENCOUNTER — Non-Acute Institutional Stay (SKILLED_NURSING_FACILITY): Payer: Medicare Other | Admitting: Gerontology

## 2017-02-21 DIAGNOSIS — F0280 Dementia in other diseases classified elsewhere without behavioral disturbance: Secondary | ICD-10-CM

## 2017-02-21 DIAGNOSIS — E119 Type 2 diabetes mellitus without complications: Secondary | ICD-10-CM

## 2017-02-21 DIAGNOSIS — E039 Hypothyroidism, unspecified: Secondary | ICD-10-CM | POA: Diagnosis not present

## 2017-02-22 ENCOUNTER — Encounter
Admission: RE | Admit: 2017-02-22 | Discharge: 2017-02-22 | Disposition: A | Discharge status: Home / Self Care | Payer: Medicare Other | Source: Ambulatory Visit | Attending: Internal Medicine | Admitting: Internal Medicine

## 2017-03-03 IMAGING — CR DG KNEE 1-2V*R*
1 series · 2 of 2 positions shown · non-contrast
Comparison: RIGHT femoral radiographs 11/11/2013

CLINICAL DATA: RIGHT thigh and knee pain per family, no new injury,
surgery to RIGHT hip and femur in June 2014, diabetes mellitus,
hypertension

EXAM:
RIGHT KNEE - 1-2 VIEW

[Series 1: ap · 0.17mm/px · 2 of 2 slices shown]
[im 1/2]
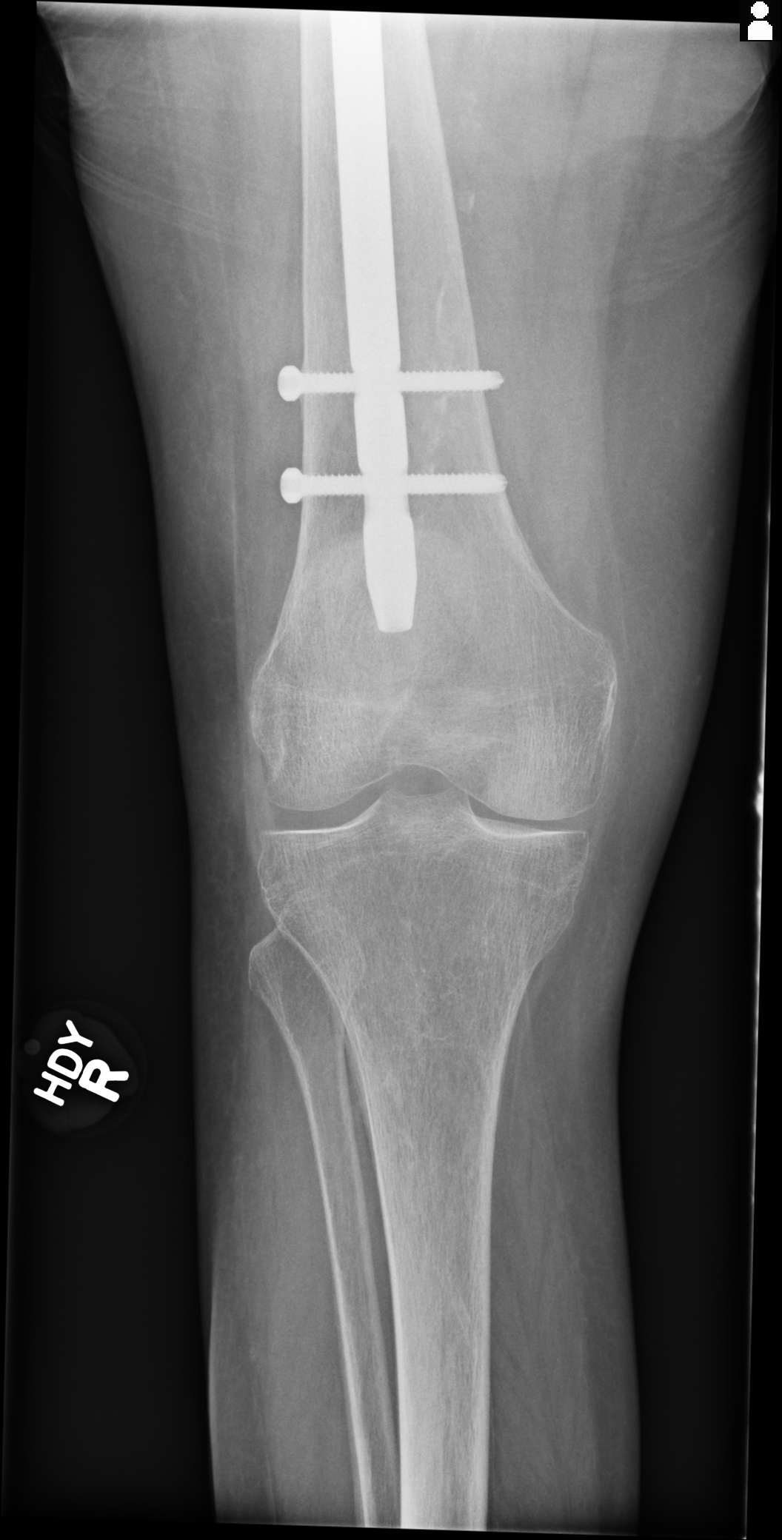
[im 2/2]
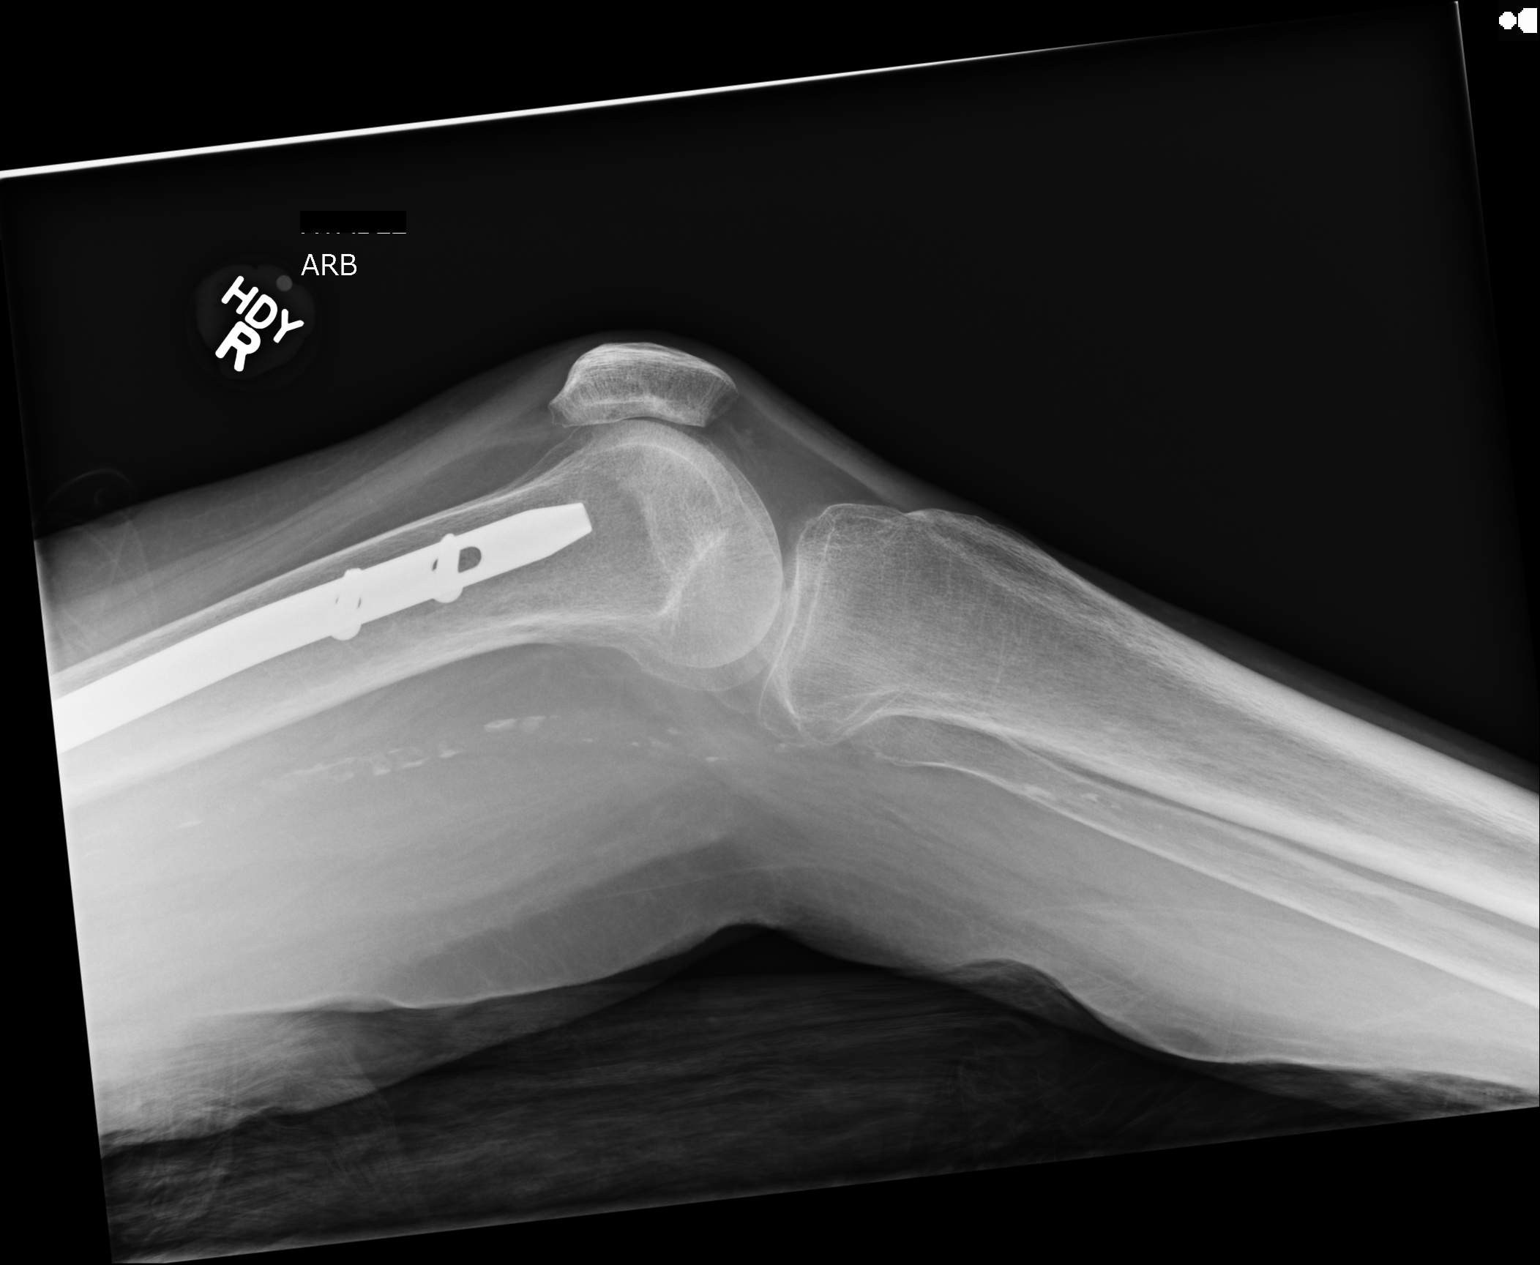

[2 of 2 positions shown; findings below may reference images not displayed]

FINDINGS: Osseous demineralization.

IM nail with distal locking screws in distal RIGHT femur.

Hardware appears intact.

Medial compartment joint space narrowing RIGHT knee.

No acute fracture, dislocation or bone destruction.

No knee joint effusion or regional soft tissue abnormality.

Scattered atherosclerotic calcifications of the distal superficial
femoral and popliteal arteries.
IMPRESSION: Osseous demineralization with evidence of prior RIGHT femoral
nailing.

Degenerative changes RIGHT knee without acute bony abnormalities.

## 2017-03-05 ENCOUNTER — Other Ambulatory Visit
Admission: RE | Admit: 2017-03-05 | Discharge: 2017-03-05 | Disposition: A | Discharge status: Home / Self Care | Payer: Medicare Other | Source: Ambulatory Visit | Attending: Gerontology | Admitting: Gerontology

## 2017-03-05 DIAGNOSIS — R8290 Unspecified abnormal findings in urine: Secondary | ICD-10-CM | POA: Diagnosis present

## 2017-03-05 LAB — URINALYSIS, COMPLETE (UACMP) WITH MICROSCOPIC
Bilirubin Urine: NEGATIVE
GLUCOSE, UA: 150 mg/dL — AB
KETONES UR: NEGATIVE mg/dL
Nitrite: NEGATIVE
PROTEIN: 100 mg/dL — AB
Specific Gravity, Urine: 1.021 (ref 1.005–1.030)
Squamous Epithelial / LPF: NONE SEEN
pH: 8 (ref 5.0–8.0)

## 2017-03-06 ENCOUNTER — Other Ambulatory Visit: Payer: Self-pay

## 2017-03-06 MED ORDER — HYDROCODONE-ACETAMINOPHEN 5-325 MG PO TABS: 1.0000 | ORAL_TABLET | Freq: Three times a day (TID) | ORAL | 0 refills | Status: DC

## 2017-03-06 NOTE — Telephone Encounter (Signed)
Rx sent to [facility] phone : [phone] , fax : [phone]  

## 2017-03-08 LAB — URINE CULTURE: Culture: >=100000 — AB

## 2017-03-17 ENCOUNTER — Encounter: Payer: Self-pay | Admitting: Gerontology

## 2017-03-21 ENCOUNTER — Non-Acute Institutional Stay (SKILLED_NURSING_FACILITY): Payer: Medicare Other | Admitting: Gerontology

## 2017-03-21 ENCOUNTER — Encounter: Payer: Self-pay | Admitting: Gerontology

## 2017-03-21 DIAGNOSIS — S42211A Unspecified displaced fracture of surgical neck of right humerus, initial encounter for closed fracture: Secondary | ICD-10-CM

## 2017-03-21 DIAGNOSIS — F0280 Dementia in other diseases classified elsewhere without behavioral disturbance: Secondary | ICD-10-CM | POA: Diagnosis not present

## 2017-03-21 DIAGNOSIS — G301 Alzheimer's disease with late onset: Secondary | ICD-10-CM | POA: Diagnosis not present

## 2017-03-21 DIAGNOSIS — M1612 Unilateral primary osteoarthritis, left hip: Secondary | ICD-10-CM | POA: Diagnosis not present

## 2017-03-22 ENCOUNTER — Encounter
Admission: RE | Admit: 2017-03-22 | Discharge: 2017-03-22 | Disposition: A | Discharge status: Home / Self Care | Payer: Medicare Other | Source: Ambulatory Visit | Attending: Internal Medicine | Admitting: Internal Medicine

## 2017-03-30 NOTE — Assessment & Plan Note (Signed)
Stable. Slowly progressive. Starting to require more assistance with ADLs, etc.

## 2017-03-30 NOTE — Progress Notes (Signed)
Location:    Nursing Home Room Number: 316A Place of Service:  SNF (31) Provider:  Toni Arthurs, NP-C  Kirk Ruths, MD  Patient Care Team: Kirk Ruths, MD as PCP - General (Internal Medicine) Einar Pheasant, MD (Internal Medicine)  Extended Emergency Contact Information Primary Emergency Contact: Duwayne Heck Address: [address]          [place], [place] [address] [name] of [place] Phone: [phone] Relation: None Secondary Emergency Contact: [name] Address: [address]          [place], [place] [address] Home Phone: [phone] Relation: None  Code Status:  DNR Goals of care: Advanced Directive information Advanced Directives 03/21/2017  Does Patient Have a Medical Advance Directive? Yes  Type of Advance Directive Out of facility DNR (pink MOST or yellow form)  Does patient want to make changes to medical advance directive? No - Patient declined  Copy of [place] in Chart? -  Pre-existing out of facility DNR order (yellow form or pink MOST form) Yellow form placed in chart (order not valid for inpatient use)     Chief Complaint  Patient presents with  . Medical Management of Chronic Issues    Routine Visit    HPI:  Pt is a 84 y.o. female seen today for medical management of chronic diseases.    Hypothyroidism Stable. Recent TSH 1.8 without use of medication.   Diabetes mellitus Stable. No recent episodes of hypoglycemia. Appetite and po intake have improved. Will adjust Amaryl soon if appetite remains stable. Recent A1c 8.4. Currently on Amaryl 2 mg tablets- 1 1/2 tabs (3 mg) po BID. FSBS checked Q am and prn  Dementia without behavioral disturbance Stable. Pt continues to have very slow, gradual decline. Pt continues to make her potholders, but has slowed down and is making more mistakes than she had been recently. Pt is pleasantly confused. Always smiling. Occasionally gets up unassisted/ forgets to call  for assistance.     Past Medical History:  Diagnosis Date  . Alzheimer's disease   . Anemia   . AR (allergic rhinitis)   . Arthritis   . Atrial fibrillation ([facility])    post op, converted to NSR wtih amiodarone  . Bladder cancer ([place]) 2003   s/p resection and chemotherapy, followed by Dr Jacqlyn Larsen  . Cataract cortical, senile   . Chicken pox   . COPD (chronic obstructive pulmonary disease) ([facility])   . Dementia   . Diabetes mellitus ([place])   . Diabetes mellitus without complication ([facility])   . Dysphagia   . Eczema    unspecified  . GERD (gastroesophageal reflux disease)   . History of bronchitis   . Hives   . Hypercholesterolemia   . Hyperlipemia   . Hypertension   . Hypothyroidism   . Inner ear disease   . Lung cancer ([facility])    squamous cell s/p left upper lobe llingulectomy 12/08/04 - Dr Arlyce Dice  . Shingles   . Thyroid disease   . Ventral hernia   . Vitamin B12 deficiency anemia due to intrinsic factor deficiency    Past Surgical History:  Procedure Laterality Date  . BLADDER SURGERY  2003   for bladder cancer - Dr Jacqlyn Larsen  . CHOLECYSTECTOMY  1986  . HERNIA REPAIR    . LUNG CANCER SURGERY  12/08/04   s/p left upper lobe lingulectomy - Dr Arlyce Dice  . ORIF FEMUR FRACTURE Right 09/18/2012  . ORIF HIP FRACTURE  08/26/2012  Open reduction internal fixation, right intertrochanteric hip fracture    Allergies  Allergen Reactions  . Lovenox [Enoxaparin Sodium]   . Novolog [Insulin Aspart]     Allergies as of 02/21/2017      Reactions   Lovenox [enoxaparin Sodium]    Novolog [insulin Aspart]       Medication List        Accurate as of 02/21/17 11:59 PM. Always use your most recent med list.          bisacodyl 10 MG suppository Commonly known as:  DULCOLAX Place 10 mg rectally daily as needed for mild constipation or moderate constipation. *If Milk of Magnesia does not resolve constipation.*   feeding supplement (ENSURE ENLIVE) Liqd Take 237 mLs by mouth 2 (two) times  daily.   gabapentin 100 MG capsule Commonly known as:  NEURONTIN Take 100 mg by mouth daily. 9 am   gabapentin 300 MG capsule Commonly known as:  NEURONTIN Take 300 mg by mouth at bedtime.   glimepiride 2 MG tablet Commonly known as:  AMARYL Take 3 mg by mouth 2 (two) times daily. 1 and 1/2 tablet = 3 mg   HYDROcodone-acetaminophen 5-325 MG tablet Commonly known as:  NORCO/VICODIN Take 1 tablet by mouth 3 (three) times daily. Ok to hold for sedation. Do not exceed 3000 mg of tylenol in 24 hours. Consider all sources.   HYDROcodone-acetaminophen 5-325 MG tablet Commonly known as:  NORCO/VICODIN Take 1 tablet by mouth every 4 (four) hours as needed. Do not exceed 3000 mg of tylenol in 24 hours. Consider all sources.   ipratropium-albuterol 0.5-2.5 (3) MG/3ML Soln Commonly known as:  DUONEB Take 3 mLs by nebulization 3 (three) times daily.   ipratropium-albuterol 0.5-2.5 (3) MG/3ML Soln Commonly known as:  DUONEB Take 3 mLs by nebulization every 6 (six) hours as needed.   Lidocaine 4 % Ptch Apply 1 patch topically daily. Apply at 9 am to area on lower back daily. Remove after 12 hours   losartan 50 MG tablet Commonly known as:  COZAAR Take 50 mg by mouth daily.   MAG-AL PLUS XS 401-027-25 MG/5ML suspension Generic drug:  alum & mag hydroxide-simeth Take 5 mLs by mouth 3 (three) times daily. Give with or right after meals   magnesium hydroxide 400 MG/5ML suspension Commonly known as:  MILK OF MAGNESIA Take 30 mLs by mouth every 4 (four) hours as needed for mild constipation. If Constipation/ no BM for 2 days.  *no results in 24 hours may administer bisacodyl suppository*   Melatonin 3 MG Tabs Take 6 mg by mouth at bedtime. 2 tabs   metoprolol succinate 25 MG 24 hr tablet Commonly known as:  TOPROL-XL Take 25 mg by mouth daily.   mirtazapine 15 MG tablet Commonly known as:  REMERON Take 7.5 mg by mouth at bedtime. 1/2 tab   montelukast 10 MG tablet Commonly known  as:  SINGULAIR Take 10 mg by mouth at bedtime.   mupirocin cream 2 % Commonly known as:  BACTROBAN Apply 1 application topically daily. Apply thin film to Right 3rd toe, place folded 2x2 gauze between the 3rd and 4th toes daily   nystatin powder Commonly known as:  MYCOSTATIN/NYSTOP Apply thin film of powder topically underneath each breast every shift until healed for redness/itching.   nystatin cream Commonly known as:  MYCOSTATIN Apply thin film in folds of stomach and thigh areas 2 times daily until areas are healed   omeprazole 20 MG capsule Commonly known as:  PRILOSEC Take 20 mg by mouth 2 (two) times daily.   OXYGEN Inhale 2 L into the lungs every 4 (four) hours as needed. Check O2 sats prior to placing on patient and at least every 4 hours after applying. Notify MD if oxygen sats drop below baseline while receiving oxygen or no improvement in dyspnea.   polyethylene glycol packet Commonly known as:  MIRALAX / GLYCOLAX Take 17 g by mouth daily.   pravastatin 10 MG tablet Commonly known as:  PRAVACHOL Take 10 mg by mouth once a week. On Sunday   rOPINIRole 0.25 MG tablet Commonly known as:  REQUIP Take 1 tablet (0.25 mg total) by mouth at bedtime.   sennosides-docusate sodium 8.6-50 MG tablet Commonly known as:  SENOKOT-S Take 1 tablet by mouth daily as needed.   torsemide 5 MG tablet Commonly known as:  DEMADEX Take 5 mg by mouth daily.   trolamine salicylate 10 % cream Commonly known as:  ASPERCREME Apply 1 application topically 3 (three) times daily as needed for muscle pain. Apply and massage on the right hip/outer thigh.       Review of Systems  Unable to perform ROS: Dementia  Constitutional: Negative for activity change, appetite change, chills, diaphoresis and fever.  HENT: Negative for congestion, mouth sores, nosebleeds, postnasal drip, sneezing, sore throat, trouble swallowing and voice change.   Respiratory: Negative for apnea, cough, choking,  chest tightness, shortness of breath and wheezing.   Cardiovascular: Negative for chest pain, palpitations and leg swelling.  Gastrointestinal: Negative for abdominal distention, abdominal pain, constipation, diarrhea and nausea.  Genitourinary: Negative for difficulty urinating, dysuria, frequency and urgency.  Musculoskeletal: Positive for arthralgias (typical arthritis). Negative for back pain, gait problem and myalgias.  Skin: Negative for color change, pallor, rash and wound.  Neurological: Positive for weakness. Negative for dizziness, tremors, syncope, speech difficulty, numbness and headaches.  Psychiatric/Behavioral: Positive for confusion. Negative for agitation and behavioral problems.  All other systems reviewed and are negative.   Immunization History  Administered Date(s) Administered  . Influenza Split 12/05/2011  . Influenza-Unspecified 10/21/2013, 10/08/2014, 10/08/2015, 10/20/2016  . PPD Test 11/05/2015, 10/22/2016   Pertinent  Health Maintenance Due  Topic Date Due  . FOOT EXAM  12/05/1943  . OPHTHALMOLOGY EXAM  12/05/1943  . DEXA SCAN  12/05/2000  . PNA vac Low Risk Adult (1 of 2 - PCV13) 12/05/2000  . HEMOGLOBIN A1C  08/05/2017  . INFLUENZA VACCINE  Completed   No flowsheet data found. Functional Status Survey:    Vitals:   02/21/17 1152  BP: 131/72  Pulse: 78  Resp: 16  Temp: 97.6 F (36.4 C)  TempSrc: Oral  SpO2: 97%  Weight: 150 lb 1.6 oz (68.1 kg)  Height: 5\' 2"  (1.575 m)   Body mass index is 27.45 kg/m. Physical Exam  Constitutional: Vital signs are normal. She appears well-developed and well-nourished. She appears lethargic. She is active and cooperative. She is easily aroused. She does not appear ill. No distress. Nasal cannula in place.  HENT:  Head: Normocephalic and atraumatic.  Mouth/Throat: Uvula is midline, oropharynx is clear and moist and mucous membranes are normal. Mucous membranes are not pale, not dry and not cyanotic.  Eyes:  Conjunctivae, EOM and lids are normal. Pupils are equal, round, and reactive to light.  Neck: Trachea normal, normal range of motion and full passive range of motion without pain. Neck supple. No JVD present. No tracheal deviation, no edema and no erythema present. No thyromegaly present.  Cardiovascular:  Normal rate, regular rhythm, normal heart sounds, intact distal pulses and normal pulses. Exam reveals no gallop, no distant heart sounds and no friction rub.  No murmur heard. Pulses:      Dorsalis pedis pulses are 2+ on the right side, and 2+ on the left side.  No edema  Pulmonary/Chest: Effort normal. No accessory muscle usage. No respiratory distress. She has no decreased breath sounds. She has wheezes (faint) in the right upper field and the left upper field. She has no rhonchi. She has no rales. She exhibits no tenderness.  Abdominal: Soft. Normal appearance and bowel sounds are normal. She exhibits no distension and no ascites. There is no tenderness.  Musculoskeletal: Normal range of motion. She exhibits no edema.       Right upper arm: She exhibits tenderness and swelling.  Expected osteoarthritis, stiffness; Bilateral Calves soft, supple. Negative Homan's Sign. B- pedal pulses equal; Right humerus fracture. Arm in a sling; generalized weakness  Neurological: She is easily aroused. She has normal strength. She appears lethargic. She displays atrophy. A sensory deficit is present. She exhibits abnormal muscle tone. Coordination and gait abnormal.  Skin: Skin is warm, dry and intact. She is not diaphoretic. No cyanosis. No pallor. Nails show no clubbing.  Psychiatric: She has a normal mood and affect. Her speech is normal and behavior is normal. Thought content normal. Cognition and memory are impaired. She expresses impulsivity. She exhibits abnormal recent memory.  Nursing note and vitals reviewed.   Labs reviewed: Recent Labs    05/21/16 1300 07/13/16 0710 02/05/17 0530  NA 142 140  143  K 3.7 3.9 3.9  CL 105 106 103  CO2 25 26 29   GLUCOSE 158* 128* 175*  BUN 23* 25* 38*  CREATININE 0.96 0.80 1.08*  CALCIUM 9.1 9.0 8.8*  MG 1.7  --  3.1*   Recent Labs    05/21/16 1300 07/13/16 0710 02/05/17 0530  AST 25 15 19   ALT 18 14 16   ALKPHOS 47 46 57  BILITOT 0.5 0.6 0.6  PROT 6.7 6.4* 5.7*  ALBUMIN 3.9 3.6 3.1*   Recent Labs    05/21/16 1300 07/13/16 0710 02/05/17 0530  WBC 8.9 9.2 6.0  NEUTROABS 5.7 4.7 2.9  HGB 14.3 13.6 13.0  HCT 44.2 40.4 39.9  MCV 91.2 89.1 90.4  PLT 292 308 232   Lab Results  Component Value Date   TSH 1.830 02/05/2017   Lab Results  Component Value Date   HGBA1C 8.4 (H) 02/05/2017   Lab Results  Component Value Date   CHOL 180 02/05/2017   HDL 33 (L) 02/05/2017   LDLCALC 93 02/05/2017   TRIG 271 (H) 02/05/2017   CHOLHDL 5.5 02/05/2017    Significant Diagnostic Results in last 30 days:  No results found.  Assessment/Plan Cindy [name] was seen today for medical management of chronic issues.  Diagnoses and all orders for this visit:  Hypothyroidism, unspecified type  Type 2 diabetes mellitus without complication, without long-term current use of insulin (HCC)  Dementia associated with other underlying disease without behavioral disturbance   Above listed conditions stable  Continue current medication regimen  Continue to monitor FSBS daily and prn  Continue to monitor closely for safety as pt is a fall risk  Assist with ADLs and meals as appropriate  OOB to chair daily and/or as tolerated  Continue to encourage pt to participate in activities and interact with other residents.   Continue Palliative Care services  Family/ staff Communication:  Total Time:  Documentation:  Face to Face:  Family/Phone:   Labs/tests ordered:  Recent labs reviewed. Not due  Medication list reviewed and assessed for continued appropriateness. Monthly medication orders reviewed and signed.  Vikki Ports,  NP-C [facility]iatric[facility] [phone], [place] [address] Cell Phone (Mon-Fri 8am-5pm):  [phone] On Call:  [phone] & follow prompts after 5pm & weekends Office Phone:  [phone] Office Fax:  [phone]

## 2017-03-30 NOTE — Assessment & Plan Note (Signed)
OA stable. OA pain controlled with Norco 5/325 mg TID scheduled and Q 4 hours prn as well as TID prn Aspercreme

## 2017-03-30 NOTE — Assessment & Plan Note (Signed)
Stable. Pt continues to have very slow, gradual decline. Pt continues to make her potholders, but has slowed down and is making more mistakes than she had been recently. Pt is pleasantly confused. Always smiling. Occasionally gets up unassisted/ forgets to call for assistance.

## 2017-03-30 NOTE — Progress Notes (Signed)
Location:    Nursing Home Room Number: 316A Place of Service:  SNF (31) Provider:  Toni Arthurs, NP-C  Kirk Ruths, MD  Patient Care Team: Kirk Ruths, MD as PCP - General (Internal Medicine) Einar Pheasant, MD (Internal Medicine)  Extended Emergency Contact Information Primary Emergency Contact: Duwayne Heck Address: [address]          [place], [place] [address] [name] of [place] Phone: [phone] Relation: None Secondary Emergency Contact: [name] Address: [address]          [place], [place] [address] Home Phone: [phone] Relation: None  Code Status:  DNR Goals of care: Advanced Directive information Advanced Directives 03/21/2017  Does Patient Have a Medical Advance Directive? Yes  Type of Advance Directive Out of facility DNR (pink MOST or yellow form)  Does patient want to make changes to medical advance directive? No - Patient declined  Copy of [place] in Chart? -  Pre-existing out of facility DNR order (yellow form or pink MOST form) Yellow form placed in chart (order not valid for inpatient use)     Chief Complaint  Patient presents with  . Medical Management of Chronic Issues    Routine Visit    HPI:  Cindy [name] is a 84 y.o. female seen today for medical management of chronic diseases.    Alzheimer's disease Stable. Slowly progressive. Starting to require more assistance with ADLs, etc.   Osteoarthritis OA stable. OA pain controlled with Norco 5/325 mg TID scheduled and Q 4 hours prn as well as TID prn Aspercreme  Humeral surgical neck fracture Stable. Cindy [name] continues to wear sling for comfort and support. Pain controlled with TID scheduled Norco and Q 4 hour prn Norco. Cindy [name] also uses Lidocaine patch to the humerus for pain and ice for swelling/edema. B- radial pulses equal and strong. Cap refills WNL. Denies parasthesias. Fingers warm.   Please note Cindy [name] with limited verbal ability. Unable to obtain  complete ROS. Some ROS info obtained from staff and documentation.    Past Medical History:  Diagnosis Date  . Alzheimer's disease   . Anemia   . AR (allergic rhinitis)   . Arthritis   . Atrial fibrillation ([place])    post op, converted to NSR wtih amiodarone  . Bladder cancer ([place]) 2003   s/p resection and chemotherapy, followed by Dr Jacqlyn Larsen  . Cataract cortical, senile   . Chicken pox   . COPD (chronic obstructive pulmonary disease) ([place])   . Dementia   . Diabetes mellitus ([place])   . Diabetes mellitus without complication ([place])   . Dysphagia   . Eczema    unspecified  . GERD (gastroesophageal reflux disease)   . History of bronchitis   . Hives   . Hypercholesterolemia   . Hyperlipemia   . Hypertension   . Hypothyroidism   . Inner ear disease   . Lung cancer ([place])    squamous cell s/p left upper lobe llingulectomy 12/08/04 - Dr Arlyce Dice  . Shingles   . Thyroid disease   . Ventral hernia   . Vitamin B12 deficiency anemia due to intrinsic factor deficiency    Past Surgical History:  Procedure Laterality Date  . BLADDER SURGERY  2003   for bladder cancer - Dr Jacqlyn Larsen  . CHOLECYSTECTOMY  1986  . HERNIA REPAIR    . LUNG CANCER SURGERY  12/08/04   s/p left upper lobe lingulectomy - Dr Arlyce Dice  . ORIF FEMUR FRACTURE Right 09/18/2012  .  ORIF HIP FRACTURE  08/26/2012   Open reduction internal fixation, right intertrochanteric hip fracture    Allergies  Allergen Reactions  . Lovenox [Enoxaparin Sodium]   . Novolog [Insulin Aspart]     Allergies as of 03/21/2017      Reactions   Lovenox [enoxaparin Sodium]    Novolog [insulin Aspart]       Medication List        Accurate as of 03/21/17 11:59 PM. Always use your most recent med list.          bisacodyl 10 MG suppository Commonly known as:  DULCOLAX Place 10 mg rectally daily as needed for mild constipation or moderate constipation. *If Milk of Magnesia does not resolve constipation.*   gabapentin 100 MG  capsule Commonly known as:  NEURONTIN Take 100 mg by mouth daily. 9 am   gabapentin 300 MG capsule Commonly known as:  NEURONTIN Take 300 mg by mouth at bedtime.   glimepiride 4 MG tablet Commonly known as:  AMARYL Take 4 mg by mouth 2 (two) times daily.   HYDROcodone-acetaminophen 5-325 MG tablet Commonly known as:  NORCO/VICODIN Take 1 tablet by mouth every 4 (four) hours as needed. Do not exceed 3000 mg of tylenol in 24 hours. Consider all sources.   HYDROcodone-acetaminophen 5-325 MG tablet Commonly known as:  NORCO/VICODIN Take 1 tablet by mouth 3 (three) times daily. Ok to hold for sedation. Do not exceed 3000 mg of tylenol in 24 hours. Consider all sources.   ipratropium-albuterol 0.5-2.5 (3) MG/3ML Soln Commonly known as:  DUONEB Take 3 mLs by nebulization 3 (three) times daily.   ipratropium-albuterol 0.5-2.5 (3) MG/3ML Soln Commonly known as:  DUONEB Take 3 mLs by nebulization every 6 (six) hours as needed.   Lidocaine 4 % Ptch Apply 1 patch topically daily. Apply at 9 am to area on lower back daily. Remove after 12 hours   losartan 50 MG tablet Commonly known as:  COZAAR Take 50 mg by mouth daily.   MAG-AL PLUS XS [phone] MG/5ML suspension Generic drug:  alum & mag hydroxide-simeth Take 5 mLs by mouth 3 (three) times daily. Give with or right after meals   magnesium hydroxide 400 MG/5ML suspension Commonly known as:  MILK OF MAGNESIA Take 30 mLs by mouth every 4 (four) hours as needed for mild constipation. If Constipation/ no BM for 2 days.  *no results in 24 hours may administer bisacodyl suppository*   Melatonin 3 MG Tabs Take 6 mg by mouth at bedtime. 2 tabs   metoprolol succinate 25 MG 24 hr tablet Commonly known as:  TOPROL-XL Take 25 mg by mouth daily.   montelukast 10 MG tablet Commonly known as:  SINGULAIR Take 10 mg by mouth at bedtime.   mupirocin cream 2 % Commonly known as:  BACTROBAN Apply 1 application topically daily. Apply thin  film to Right 3rd toe, place folded 2x2 gauze between the 3rd and 4th toes daily   nystatin powder Commonly known as:  MYCOSTATIN/NYSTOP Apply thin film of powder topically underneath each breast every shift until healed for redness/itching.   nystatin cream Commonly known as:  MYCOSTATIN Apply thin film in folds of stomach and thigh areas 2 times daily until areas are healed   omeprazole 20 MG capsule Commonly known as:  PRILOSEC Take 20 mg by mouth 2 (two) times daily.   OXYGEN Inhale 2 L into the lungs every 4 (four) hours as needed. Check O2 sats prior to placing on patient and at  least every 4 hours after applying. Notify MD if oxygen sats drop below baseline while receiving oxygen or no improvement in dyspnea.   polyethylene glycol packet Commonly known as:  MIRALAX / GLYCOLAX Take 17 g by mouth daily.   pravastatin 10 MG tablet Commonly known as:  PRAVACHOL Take 10 mg by mouth once a week. On Sunday   rOPINIRole 0.25 MG tablet Commonly known as:  REQUIP Take 1 tablet (0.25 mg total) by mouth at bedtime.   sennosides-docusate sodium 8.6-50 MG tablet Commonly known as:  SENOKOT-S Take 1 tablet by mouth daily as needed.   torsemide 5 MG tablet Commonly known as:  DEMADEX Take 5 mg by mouth daily.   traZODone 50 MG tablet Commonly known as:  DESYREL Take 25 mg by mouth at bedtime. 1/2 tab   trolamine salicylate 10 % cream Commonly known as:  ASPERCREME Apply 1 application topically 3 (three) times daily as needed for muscle pain. Apply and massage on the right hip/outer thigh.       Review of Systems  Unable to perform ROS: Dementia  Constitutional: Negative for activity change, appetite change, chills, diaphoresis and fever.  HENT: Negative for congestion, mouth sores, nosebleeds, postnasal drip, sneezing, sore throat, trouble swallowing and voice change.   Respiratory: Negative for apnea, cough, choking, chest tightness, shortness of breath and wheezing.    Cardiovascular: Negative for chest pain, palpitations and leg swelling.  Gastrointestinal: Negative for abdominal distention, abdominal pain, constipation, diarrhea and nausea.  Genitourinary: Negative for difficulty urinating, dysuria, frequency and urgency.  Musculoskeletal: Positive for arthralgias (typical arthritis). Negative for back pain, gait problem and myalgias.  Skin: Negative for color change, pallor, rash and wound.  Neurological: Positive for weakness. Negative for dizziness, tremors, syncope, speech difficulty, numbness and headaches.  Psychiatric/Behavioral: Positive for confusion. Negative for agitation and behavioral problems.  All other systems reviewed and are negative.   Immunization History  Administered Date(s) Administered  . Influenza Split 12/05/2011  . Influenza-Unspecified 10/21/2013, 10/08/2014, 10/08/2015, 10/20/2016  . PPD Test 11/05/2015, 10/22/2016   Pertinent  Health Maintenance Due  Topic Date Due  . FOOT EXAM  12/05/1943  . OPHTHALMOLOGY EXAM  12/05/1943  . DEXA SCAN  12/05/2000  . PNA vac Low Risk Adult (1 of 2 - PCV13) 12/05/2000  . HEMOGLOBIN A1C  08/05/2017  . INFLUENZA VACCINE  Completed   No flowsheet data found. Functional Status Survey:    Vitals:   03/21/17 1154  BP: (!) 148/66  Pulse: 82  Resp: 20  Temp: 97.8 F (36.6 C)  TempSrc: Oral  SpO2: 96%  Weight: 150 lb 6.4 oz (68.2 kg)  Height: 5\' 2"  (1.575 m)   Body mass index is 27.51 kg/m. Physical Exam  Constitutional: She is oriented to person, place, and time. Vital signs are normal. She appears well-developed and well-nourished. She is active and cooperative. She does not appear ill. No distress.  HENT:  Head: Normocephalic and atraumatic.  Mouth/Throat: Uvula is midline, oropharynx is clear and moist and mucous membranes are normal. Mucous membranes are not pale, not dry and not cyanotic.  Eyes: Conjunctivae, EOM and lids are normal. Pupils are equal, round, and reactive  to light.  Neck: Trachea normal, normal range of motion and full passive range of motion without pain. Neck supple. No JVD present. No tracheal deviation, no edema and no erythema present. No thyromegaly present.  Cardiovascular: Normal rate, regular rhythm, normal heart sounds, intact distal pulses and normal pulses. Exam reveals no gallop,  no distant heart sounds and no friction rub.  No murmur heard. Pulses:      Dorsalis pedis pulses are 2+ on the right side, and 2+ on the left side.  No edema  Pulmonary/Chest: Effort normal and breath sounds normal. No accessory muscle usage. No respiratory distress. She has no decreased breath sounds. She has no wheezes. She has no rhonchi. She has no rales. She exhibits no tenderness.  Abdominal: Soft. Normal appearance and bowel sounds are normal. She exhibits no distension and no ascites. There is no tenderness.  Musculoskeletal: Normal range of motion.       Right upper arm: She exhibits tenderness and edema.  Expected osteoarthritis, stiffness; Bilateral Calves soft, supple. Negative Homan's Sign. B- pedal pulses equal; Right humerus fracture. Shoulder support in place  Neurological: She is alert and oriented to person, place, and time. She has normal strength. She displays atrophy. A sensory deficit is present. She exhibits abnormal muscle tone. Coordination and gait abnormal.  Skin: Skin is warm, dry and intact. She is not diaphoretic. No cyanosis. No pallor. Nails show no clubbing.  Psychiatric: She has a normal mood and affect. Her speech is normal and behavior is normal. Thought content normal. Cognition and memory are impaired. She expresses impulsivity. She exhibits abnormal recent memory and abnormal remote memory.  Nursing note and vitals reviewed.   Labs reviewed: Recent Labs    05/21/16 1300 07/13/16 0710 02/05/17 0530  NA 142 140 143  K 3.7 3.9 3.9  CL 105 106 103  CO2 25 26 29   GLUCOSE 158* 128* 175*  BUN 23* 25* 38*  CREATININE  0.96 0.80 1.08*  CALCIUM 9.1 9.0 8.8*  MG 1.7  --  3.1*   Recent Labs    05/21/16 1300 07/13/16 0710 02/05/17 0530  AST 25 15 19   ALT 18 14 16   ALKPHOS 47 46 57  BILITOT 0.5 0.6 0.6  PROT 6.7 6.4* 5.7*  ALBUMIN 3.9 3.6 3.1*   Recent Labs    05/21/16 1300 07/13/16 0710 02/05/17 0530  WBC 8.9 9.2 6.0  NEUTROABS 5.7 4.7 2.9  HGB 14.3 13.6 13.0  HCT 44.2 40.4 39.9  MCV 91.2 89.1 90.4  PLT 292 308 232   Lab Results  Component Value Date   TSH 1.830 02/05/2017   Lab Results  Component Value Date   HGBA1C 8.4 (H) 02/05/2017   Lab Results  Component Value Date   CHOL 180 02/05/2017   HDL 33 (L) 02/05/2017   LDLCALC 93 02/05/2017   TRIG 271 (H) 02/05/2017   CHOLHDL 5.5 02/05/2017    Significant Diagnostic Results in last 30 days:  No results found.  Assessment/Plan Brianny was seen today for medical management of chronic issues.  Diagnoses and all orders for this visit:  Osteoarthritis of left hip, unspecified osteoarthritis type  Late onset Alzheimer's disease without behavioral disturbance  Closed displaced fracture of surgical neck of right humerus, unspecified fracture morphology, initial encounter   Above listed conditions stable  Continue current medication regimen  Continue use of sling/shoulder support for comfort  Continue prn use of ice for comfort  Re-orient as needed  Continue to encourage Cindy [name] participation in activities and interactions with other residents  Monitor skin, neurovascular integrity of arm/ fingers Q shift  Pain control as ordered  Continue OOB to chair Q Day  Continue Palliative Medicine services for continuity of care  Family/ staff Communication:   Total Time:  Documentation:  Face to Face:  Family/Phone:  Labs/tests ordered:  Not due  Medication list reviewed and assessed for continued appropriateness. Monthly medication orders reviewed and signed.  Vikki Ports, NP-C Geriatrics [facility] [phone], [place] [address] Cell Phone (Mon-Fri 8am-5pm):  [phone] On Call:  [phone] & follow prompts after 5pm & weekends Office Phone:  [phone] Office Fax:  [phone]

## 2017-03-30 NOTE — Assessment & Plan Note (Signed)
Stable. Pt continues to wear sling for comfort and support. Pain controlled with TID scheduled Norco and Q 4 hour prn Norco. Pt also uses Lidocaine patch to the humerus for pain and ice for swelling/edema. B- radial pulses equal and strong. Cap refills WNL. Denies parasthesias. Fingers warm.

## 2017-03-30 NOTE — Assessment & Plan Note (Signed)
Stable. Recent TSH 1.8 without use of medication.

## 2017-03-30 NOTE — Assessment & Plan Note (Signed)
Stable. No recent episodes of hypoglycemia. Appetite and po intake have improved. Will adjust Amaryl soon if appetite remains stable. Recent A1c 8.4. Currently on Amaryl 2 mg tablets- 1 1/2 tabs (3 mg) po BID. FSBS checked Q am and prn

## 2017-04-15 ENCOUNTER — Other Ambulatory Visit: Payer: Self-pay

## 2017-04-15 MED ORDER — MORPHINE SULFATE (CONCENTRATE) 20 MG/ML PO SOLN: 5.0000 mg | ORAL | 0 refills | Status: DC | PRN

## 2017-04-15 NOTE — Telephone Encounter (Signed)
Rx sent to [facility] phone : [phone] , fax : [phone]  

## 2017-04-16 ENCOUNTER — Non-Acute Institutional Stay (SKILLED_NURSING_FACILITY): Payer: Medicare Other | Admitting: Gerontology

## 2017-04-16 DIAGNOSIS — J449 Chronic obstructive pulmonary disease, unspecified: Secondary | ICD-10-CM

## 2017-04-16 DIAGNOSIS — R634 Abnormal weight loss: Secondary | ICD-10-CM | POA: Diagnosis not present

## 2017-04-16 DIAGNOSIS — R41841 Cognitive communication deficit: Secondary | ICD-10-CM | POA: Diagnosis not present

## 2017-04-16 NOTE — Assessment & Plan Note (Signed)
Progressive.  Patient continues to have weight loss.  On 04/13/2017 patient was 125.7 pounds with a BMI of 22.99.  On 02/25/2017 is 150.4 pounds with a BMI of 27.51.  Patient is only eating on average less than 25% of meals.  Only bites and sips of liquids in the past few weeks.  Order for hospice services initiated on the basis of abnormal weight loss

## 2017-04-16 NOTE — Progress Notes (Signed)
Location:      Place of Service:  SNF (31) Provider:  Toni Arthurs, NP-C  Kirk Ruths, MD  Patient Care Team: Kirk Ruths, MD as PCP - General (Internal Medicine) Cindy Pheasant, MD (Internal Medicine)  Extended Emergency Contact Information Primary Emergency Contact: Duwayne Heck Address: [address]          [place], [place] [address] [name] of [place] Phone: [phone] Relation: None Secondary Emergency Contact: [name] Address: [address]          [place], [place] [address] Home Phone: [phone] Relation: None  Code Status: DNR Goals of care: Advanced Directive information Advanced Directives 03/21/2017  Does Patient Have a Medical Advance Directive? Yes  Type of Advance Directive Out of facility DNR (pink MOST or yellow form)  Does patient want to make changes to medical advance directive? No - Patient declined  Copy of [place] in Chart? -  Pre-existing out of facility DNR order (yellow form or pink MOST form) Yellow form placed in chart (order not valid for inpatient use)     Chief Complaint  Patient presents with  . Medical Management of Chronic Issues    HPI:  Pt is a 84 y.o. female seen today for medical management of chronic diseases.    Abnormal weight loss Progressive.  Patient continues to have weight loss.  On 04/13/2017 patient was 125.7 pounds with a BMI of 22.99.  On 02/25/2017 is 150.4 pounds with a BMI of 27.51.  Patient is only eating on average less than 25% of meals.  Only bites and sips of liquids in the past few weeks.  Order for hospice services initiated on the basis of abnormal weight loss  Cognitive communication deficit Progressive.  Patient is now completely nonverbal.  Patient does make intermittent eye contact but does not hold the contact.  No longer smiling.  Chronic obstructive pulmonary disease (HCC) Progressive.  As of right now, patient likely has pneumonia related to the  COPD.  Family declined chest x-rays and work-up as she is currently on hospice, but did want to try antibiotics.  Over the weekend, patient "coughed up a large ball of green stuff" patient is now able to 2 L nasal cannula.  Patient is weekend and unable to easily expectorate secretions.  Patient continues on 3 times daily scheduled DuoNeb's and as needed doses as well as Singulair 10 mg p.o. nightly.  Patient is unable to use inhalers effectively.  Staff has also been utilizing Roxanol 5 to 10 mg p.o. every hour as needed for dyspnea.  Please note pt with limited verbal ability. Unable to obtain complete ROS. Some ROS info obtained from staff and documentation.   Past Medical History:  Diagnosis Date  . Alzheimer's disease   . Anemia   . AR (allergic rhinitis)   . Arthritis   . Atrial fibrillation ([facility] [place])    post op, converted to NSR wtih amiodarone  . Bladder cancer ([place]) 2003   s/p resection and chemotherapy, followed by Dr Jacqlyn Larsen  . Cataract cortical, senile   . Chicken pox   . COPD (chronic obstructive pulmonary disease) ([place])   . Dementia   . Diabetes mellitus ([place])   . Diabetes mellitus without complication ([place])   . Dysphagia   . Eczema    unspecified  . GERD (gastroesophageal reflux disease)   . History of bronchitis   . Hives   . Hypercholesterolemia   . Hyperlipemia   . Hypertension   .  Hypothyroidism   . Inner ear disease   . Lung cancer ([place])    squamous cell s/p left upper lobe llingulectomy 12/08/04 - Dr Arlyce Dice  . Shingles   . Thyroid disease   . Ventral hernia   . Vitamin B12 deficiency anemia due to intrinsic factor deficiency    Past Surgical History:  Procedure Laterality Date  . BLADDER SURGERY  2003   for bladder cancer - Dr Jacqlyn Larsen  . CHOLECYSTECTOMY  1986  . HERNIA REPAIR    . LUNG CANCER SURGERY  12/08/04   s/p left upper lobe lingulectomy - Dr Arlyce Dice  . ORIF FEMUR FRACTURE Right 09/18/2012  . ORIF HIP FRACTURE  08/26/2012   Open reduction internal  fixation, right intertrochanteric hip fracture    Allergies  Allergen Reactions  . Lovenox [Enoxaparin Sodium]   . Novolog [Insulin Aspart]     Allergies as of 04/16/2017      Reactions   Lovenox [enoxaparin Sodium]    Novolog [insulin Aspart]       Medication List        Accurate as of 04/16/17  3:59 PM. Always use your most recent med list.          bisacodyl 10 MG suppository Commonly known as:  DULCOLAX Place 10 mg rectally daily as needed for mild constipation or moderate constipation. *If Milk of Magnesia does not resolve constipation.*   gabapentin 100 MG capsule Commonly known as:  NEURONTIN Take 100 mg by mouth daily. 9 am   gabapentin 300 MG capsule Commonly known as:  NEURONTIN Take 300 mg by mouth at bedtime.   glimepiride 4 MG tablet Commonly known as:  AMARYL Take 4 mg by mouth 2 (two) times daily.   HYDROcodone-acetaminophen 5-325 MG tablet Commonly known as:  NORCO/VICODIN Take 1 tablet by mouth every 4 (four) hours as needed. Do not exceed 3000 mg of tylenol in 24 hours. Consider all sources.   HYDROcodone-acetaminophen 5-325 MG tablet Commonly known as:  NORCO/VICODIN Take 1 tablet by mouth 3 (three) times daily. Ok to hold for sedation. Do not exceed 3000 mg of tylenol in 24 hours. Consider all sources.   ipratropium-albuterol 0.5-2.5 (3) MG/3ML Soln Commonly known as:  DUONEB Take 3 mLs by nebulization 3 (three) times daily.   ipratropium-albuterol 0.5-2.5 (3) MG/3ML Soln Commonly known as:  DUONEB Take 3 mLs by nebulization every 6 (six) hours as needed.   Lidocaine 4 % Ptch Apply 1 patch topically daily. Apply at 9 am to area on lower back daily. Remove after 12 hours   losartan 50 MG tablet Commonly known as:  COZAAR Take 50 mg by mouth daily.   MAG-AL PLUS XS [phone] MG/5ML suspension Generic drug:  alum & mag hydroxide-simeth Take 5 mLs by mouth 3 (three) times daily. Give with or right after meals   magnesium hydroxide 400  MG/5ML suspension Commonly known as:  MILK OF MAGNESIA Take 30 mLs by mouth every 4 (four) hours as needed for mild constipation. If Constipation/ no BM for 2 days.  *no results in 24 hours may administer bisacodyl suppository*   Melatonin 3 MG Tabs Take 6 mg by mouth at bedtime. 2 tabs   metoprolol succinate 25 MG 24 hr tablet Commonly known as:  TOPROL-XL Take 25 mg by mouth daily.   montelukast 10 MG tablet Commonly known as:  SINGULAIR Take 10 mg by mouth at bedtime.   morphine 20 MG/ML concentrated solution Commonly known as:  ROXANOL Take 0.25-0.5 mLs (  5-10 mg total) by mouth every hour as needed.   mupirocin cream 2 % Commonly known as:  BACTROBAN Apply 1 application topically daily. Apply thin film to Right 3rd toe, place folded 2x2 gauze between the 3rd and 4th toes daily   nystatin powder Commonly known as:  MYCOSTATIN/NYSTOP Apply thin film of powder topically underneath each breast every shift until healed for redness/itching.   nystatin cream Commonly known as:  MYCOSTATIN Apply thin film in folds of stomach and thigh areas 2 times daily until areas are healed   omeprazole 20 MG capsule Commonly known as:  PRILOSEC Take 20 mg by mouth 2 (two) times daily.   OXYGEN Inhale 2 L into the lungs every 4 (four) hours as needed. Check O2 sats prior to placing on patient and at least every 4 hours after applying. Notify MD if oxygen sats drop below baseline while receiving oxygen or no improvement in dyspnea.   polyethylene glycol packet Commonly known as:  MIRALAX / GLYCOLAX Take 17 g by mouth daily.   pravastatin 10 MG tablet Commonly known as:  PRAVACHOL Take 10 mg by mouth once a week. On Sunday   rOPINIRole 0.25 MG tablet Commonly known as:  REQUIP Take 1 tablet (0.25 mg total) by mouth at bedtime.   sennosides-docusate sodium 8.6-50 MG tablet Commonly known as:  SENOKOT-S Take 1 tablet by mouth daily as needed.   torsemide 5 MG tablet Commonly known  as:  DEMADEX Take 5 mg by mouth daily.   traZODone 50 MG tablet Commonly known as:  DESYREL Take 25 mg by mouth at bedtime. 1/2 tab   trolamine salicylate 10 % cream Commonly known as:  ASPERCREME Apply 1 application topically 3 (three) times daily as needed for muscle pain. Apply and massage on the right hip/outer thigh.       Review of Systems  Unable to perform ROS: Dementia  Constitutional: Positive for activity change, appetite change, fatigue, fever and unexpected weight change.  HENT: Positive for congestion.   Respiratory: Positive for cough, choking and shortness of breath.   Cardiovascular: Negative.   Gastrointestinal: Negative.   Genitourinary: Negative.   Musculoskeletal: Positive for arthralgias and gait problem.  Neurological: Positive for weakness.  Psychiatric/Behavioral: Positive for agitation.    Immunization History  Administered Date(s) Administered  . Influenza Split 12/05/2011  . Influenza-Unspecified 10/21/2013, 10/08/2014, 10/08/2015, 10/20/2016  . PPD Test 11/05/2015, 10/22/2016   Pertinent  Health Maintenance Due  Topic Date Due  . FOOT EXAM  12/05/1943  . OPHTHALMOLOGY EXAM  12/05/1943  . DEXA SCAN  12/05/2000  . PNA vac Low Risk Adult (1 of 2 - PCV13) 12/05/2000  . HEMOGLOBIN A1C  08/05/2017  . INFLUENZA VACCINE  Completed   No flowsheet data found. Functional Status Survey:    Vitals:   04/10/17 1100  BP: (!) 151/76  Pulse: 83  Resp: 20  Temp: 98.7 F (37.1 C)  SpO2: 94%   There is no height or weight on file to calculate BMI. Physical Exam  Constitutional: She appears well-developed and well-nourished. She appears listless. She is sleeping and cooperative. She appears ill. No distress. Nasal cannula in place.  HENT:  Head: Normocephalic and atraumatic.  Mouth/Throat: Uvula is midline, oropharynx is clear and moist and mucous membranes are normal. Mucous membranes are not pale, not dry and not cyanotic.  Eyes: Pupils are  equal, round, and reactive to light. Conjunctivae, EOM and lids are normal.  Neck: Trachea normal, normal range of motion  and full passive range of motion without pain. Neck supple. No JVD present. No tracheal deviation, no edema and no erythema present. No thyromegaly present.  Cardiovascular: Normal rate, regular rhythm, normal heart sounds, intact distal pulses and normal pulses. Exam reveals no gallop, no distant heart sounds and no friction rub.  No murmur heard. Pulses:      Dorsalis pedis pulses are 2+ on the right side, and 2+ on the left side.  No edema  Pulmonary/Chest: Effort normal. No accessory muscle usage. No respiratory distress. She has decreased breath sounds in the right upper field, the right middle field, the left upper field and the left middle field. She has no wheezes. She has rhonchi in the right lower field and the left lower field. She has no rales. She exhibits no tenderness.  Abdominal: Soft. Normal appearance and bowel sounds are normal. She exhibits no distension and no ascites. There is no tenderness.  Musculoskeletal: Normal range of motion. She exhibits no edema or tenderness.  Expected osteoarthritis, stiffness; Bilateral Calves soft, supple. Negative Homan's Sign. B- pedal pulses equal; generalized weakness, non-ambulatory  Neurological: She has normal strength. She appears listless. She displays atrophy. A cranial nerve deficit and sensory deficit is present. She exhibits abnormal muscle tone. Coordination and gait abnormal.  Skin: Skin is warm, dry and intact. She is not diaphoretic. No cyanosis. No pallor. Nails show no clubbing.  Psychiatric: She has a normal mood and affect. Her speech is normal. Thought content normal. She is agitated (at times). Cognition and memory are impaired. She expresses impulsivity and inappropriate judgment. She exhibits abnormal recent memory and abnormal remote memory.  Nursing note and vitals reviewed.   Labs reviewed: Recent  Labs    05/21/16 1300 07/13/16 0710 02/05/17 0530  NA 142 140 143  K 3.7 3.9 3.9  CL 105 106 103  CO2 25 26 29   GLUCOSE 158* 128* 175*  BUN 23* 25* 38*  CREATININE 0.96 0.80 1.08*  CALCIUM 9.1 9.0 8.8*  MG 1.7  --  3.1*   Recent Labs    05/21/16 1300 07/13/16 0710 02/05/17 0530  AST 25 15 19   ALT 18 14 16   ALKPHOS 47 46 57  BILITOT 0.5 0.6 0.6  PROT 6.7 6.4* 5.7*  ALBUMIN 3.9 3.6 3.1*   Recent Labs    05/21/16 1300 07/13/16 0710 02/05/17 0530  WBC 8.9 9.2 6.0  NEUTROABS 5.7 4.7 2.9  HGB 14.3 13.6 13.0  HCT 44.2 40.4 39.9  MCV 91.2 89.1 90.4  PLT 292 308 232   Lab Results  Component Value Date   TSH 1.830 02/05/2017   Lab Results  Component Value Date   HGBA1C 8.4 (H) 02/05/2017   Lab Results  Component Value Date   CHOL 180 02/05/2017   HDL 33 (L) 02/05/2017   LDLCALC 93 02/05/2017   TRIG 271 (H) 02/05/2017   CHOLHDL 5.5 02/05/2017    Significant Diagnostic Results in last 30 days:  No results found.  Assessment/Plan Keilynn was seen today for medical management of chronic issues.  Diagnoses and all orders for this visit:  Abnormal weight loss  Cognitive communication deficit  Chronic obstructive pulmonary disease, unspecified COPD type ([place])   Rocephin 2 g IM x1 given yesterday  Continue Rocephin 1 g IM daily x5 days, reconstituted with lidocaine  Continue duo nebs 3 times daily  Start acetylcysteine 10 mL inhaled 3 times daily x2 days, give 15 to 30 minutes after her scheduled DuoNeb  Change diet  to pured diet for ease of swallowing  Continue Roxanol 5 to 10 mg p.o. every hour as needed dyspnea, pain  Go to 2 to 5 L per nasal cannula-titrate for comfort  Admit to hospice services for comfort care  Family/ staff Communication:   Total Time:  Documentation:  Face to Face:  Family/Phone:   Labs/tests ordered: None day/family declined  Medication list reviewed and assessed for continued appropriateness. Monthly  medication orders reviewed and signed.  Vikki Ports, NP-C Geriatrics [facility] [phone], [place] [address] Cell Phone (Mon-Fri 8am-5pm):  [phone] On Call:  [phone] & follow prompts after 5pm & weekends Office Phone:  [phone] Office Fax:  [phone]

## 2017-04-16 NOTE — Assessment & Plan Note (Signed)
Progressive.  As of right now, patient likely has pneumonia related to the COPD.  Family declined chest x-rays and work-up as she is currently on hospice, but did want to try antibiotics.  Over the weekend, patient "coughed up a large ball of green stuff" patient is now able to 2 L nasal cannula.  Patient is weekend and unable to easily expectorate secretions.  Patient continues on 3 times daily scheduled DuoNeb's and as needed doses as well as Singulair 10 mg p.o. nightly.  Patient is unable to use inhalers effectively.  Staff has also been utilizing Roxanol 5 to 10 mg p.o. every hour as needed for dyspnea.

## 2017-04-16 NOTE — Assessment & Plan Note (Signed)
Progressive.  Patient is now completely nonverbal.  Patient does make intermittent eye contact but does not hold the contact.  No longer smiling.

## 2017-04-22 ENCOUNTER — Encounter
Admission: RE | Admit: 2017-04-22 | Discharge: 2017-04-22 | Disposition: A | Discharge status: Home / Self Care | Payer: Medicare Other | Source: Ambulatory Visit | Attending: Internal Medicine | Admitting: Internal Medicine

## 2017-05-16 ENCOUNTER — Encounter: Payer: Self-pay | Admitting: Gerontology

## 2017-05-16 ENCOUNTER — Non-Acute Institutional Stay (SKILLED_NURSING_FACILITY): Payer: Medicare Other | Admitting: Gerontology

## 2017-05-16 DIAGNOSIS — N289 Disorder of kidney and ureter, unspecified: Secondary | ICD-10-CM | POA: Diagnosis not present

## 2017-05-16 DIAGNOSIS — D510 Vitamin B12 deficiency anemia due to intrinsic factor deficiency: Secondary | ICD-10-CM | POA: Diagnosis not present

## 2017-05-16 DIAGNOSIS — Z8551 Personal history of malignant neoplasm of bladder: Secondary | ICD-10-CM

## 2017-05-16 NOTE — Assessment & Plan Note (Signed)
Stable. Most recent B12 level 503. Not on supplements at this time.

## 2017-05-16 NOTE — Assessment & Plan Note (Signed)
Stable. Mild gradual elevation in levels. Labs not assessed since January. Currently on Hospice services. Renally adjust meds as appropriate.

## 2017-05-16 NOTE — Assessment & Plan Note (Signed)
Stable. No longer seeing Urology for follow up.

## 2017-05-16 NOTE — Progress Notes (Signed)
Location:    [facility]ursing Home Room Number: 316A Place of Service:  SNF (31) Provider:  Toni Arthurs, NP-C  Cindy Ruths, MD  Patient Care Team: Cindy Ruths, MD as PCP - General (Internal Medicine) Cindy Pheasant, MD (Internal Medicine)  Extended Emergency Contact Information Primary Emergency Contact: Cindy [name] Address: [address]          [place], [place] [address] Cindy [name] of [place] Phone: [phone] Relation: None Secondary Emergency Contact: Cindy [name] Address: [address]          [place], [place] [address] Home Phone: [phone] Relation: None  Code Status:  DNR Goals of care: Advanced Directive information Advanced Directives 05/16/2017  Does Patient Have a Medical Advance Directive? Yes  Type of Advance Directive Out of facility DNR (pink MOST or yellow form)  Does patient want to make changes to medical advance directive? No - Patient declined  Copy of [place] in Chart? -  Pre-existing out of facility DNR order (yellow form or pink MOST form) Yellow form placed in chart (order not valid for inpatient use)     Chief Complaint  Patient presents with  . Medical Management of Chronic Issues    Routine Visit    HPI:  Pt is a 84 y.o. female seen today for medical management of chronic diseases.    Renal insufficiency Stable. Mild gradual elevation in levels. Labs not assessed since January. Currently on Hospice services. Renally adjust meds as appropriate.  H/O primary malignant neoplasm of urinary bladder Stable. No longer seeing Urology for follow up.   Vitamin B12 deficiency anemia due to intrinsic factor deficiency Stable. Most recent B12 level 503. Not on supplements at this time.  Please note pt with limited verbal/cognitive ability. Unable to obtain complete ROS. Some ROS info obtained from staff and documentation.   Past Medical History:  Diagnosis Date  . Alzheimer's disease   . Anemia   . AR  (allergic rhinitis)   . Arthritis   . Atrial fibrillation ([place])    post op, converted to NSR wtih amiodarone  . Bladder cancer ([place]) 2003   s/p resection and chemotherapy, followed by Cindy [name]  . Cataract cortical, senile   . Chicken pox   . COPD (chronic obstructive pulmonary disease) ([place])   . Dementia   . Diabetes mellitus ([place])   . Diabetes mellitus without complication ([place])   . Dysphagia   . Eczema    unspecified  . GERD (gastroesophageal reflux disease)   . History of bronchitis   . Hives   . Hypercholesterolemia   . Hyperlipemia   . Hypertension   . Hypothyroidism   . Inner ear disease   . Lung cancer ([place])    squamous cell s/p left upper lobe llingulectomy 12/08/04 - Cindy [name]  . Shingles   . Thyroid disease   . Ventral hernia   . Vitamin B12 deficiency anemia due to intrinsic factor deficiency    Past Surgical History:  Procedure Laterality Date  . BLADDER SURGERY  2003   for bladder cancer - Cindy [name]  . CHOLECYSTECTOMY  1986  . HERNIA REPAIR    . LUNG CANCER SURGERY  12/08/04   s/p left upper lobe lingulectomy - Cindy [name]  . ORIF FEMUR FRACTURE Right 09/18/2012  . ORIF HIP FRACTURE  08/26/2012   Open reduction internal fixation, right intertrochanteric hip fracture    Allergies  Allergen Reactions  . Lovenox [Enoxaparin Sodium]   . Novolog [  Insulin Aspart]     Allergies as of 05/16/2017      Reactions   Lovenox [enoxaparin Sodium]    Novolog [insulin Aspart]       Medication List        Accurate as of 05/16/17  2:25 PM. Always use your most recent med list.          bisacodyl 10 MG suppository Commonly known as:  DULCOLAX Place 10 mg rectally daily as needed for mild constipation or moderate constipation. *If Milk of Magnesia does not resolve constipation.*   DERMACLOUD EX Apply liberal amount topically to area of skin irritation prn. OK to leave at bedside.   gabapentin 100 MG capsule Commonly known as:  NEURONTIN Take 100 mg by mouth  daily. 9 am   gabapentin 300 MG capsule Commonly known as:  NEURONTIN Take 300 mg by mouth at bedtime.   glimepiride 4 MG tablet Commonly known as:  AMARYL Take 4 mg by mouth 2 (two) times daily.   HYDROcodone-acetaminophen 5-325 MG tablet Commonly known as:  NORCO/VICODIN Take 2 tablets by mouth 3 (three) times daily. FOR PAIN-DO NOT EXCEED 3000MG  OF TYLENOL IN 24 HRS. CONSIDER ALL SOURCES. OK to hold for sedation   HYDROcodone-acetaminophen 5-325 MG tablet Commonly known as:  NORCO/VICODIN Take 1 tablet by mouth every 4 (four) hours as needed. Do not exceed 3000 mg of tylenol in 24 hours. Consider all sources.   ipratropium-albuterol 0.5-2.5 (3) MG/3ML Soln Commonly known as:  DUONEB Take 3 mLs by nebulization 3 (three) times daily.   ipratropium-albuterol 0.5-2.5 (3) MG/3ML Soln Commonly known as:  DUONEB Take 3 mLs by nebulization every 6 (six) hours as needed.   Lidocaine 4 % Ptch Apply 1 patch topically daily. Apply at 9 am to area on lower back daily. Remove after 12 hours   LORazepam 0.5 MG tablet Commonly known as:  ATIVAN Take 0.5 mg by mouth 2 (two) times daily.   losartan 50 MG tablet Commonly known as:  COZAAR Take 50 mg by mouth daily.   MAG-AL PLUS XS [phone] MG/5ML suspension Generic drug:  alum & mag hydroxide-simeth Take 5 mLs by mouth 3 (three) times daily. Give with or right after meals   magnesium hydroxide 400 MG/5ML suspension Commonly known as:  MILK OF MAGNESIA Take 30 mLs by mouth every 4 (four) hours as needed for mild constipation. If Constipation/ no BM for 2 days.  *no results in 24 hours may administer bisacodyl suppository*   Melatonin 3 MG Tabs Take 6 mg by mouth at bedtime. 2 tabs   metoprolol succinate 25 MG 24 hr tablet Commonly known as:  TOPROL-XL Take 25 mg by mouth daily.   montelukast 10 MG tablet Commonly known as:  SINGULAIR Take 10 mg by mouth at bedtime.   morphine 20 MG/ML concentrated solution Commonly known  as:  ROXANOL Take 10 mg by mouth daily. (0.5 ml) Give prior to the AM bath- for pain, dyspnea, restlessness   morphine 20 MG/ML concentrated solution Commonly known as:  ROXANOL Take 0.25-0.5 mLs (5-10 mg total) by mouth every hour as needed.   mupirocin cream 2 % Commonly known as:  BACTROBAN Apply 1 application topically daily. Apply thin film to Right 3rd toe, place folded 2x2 gauze between the 3rd and 4th toes daily   NO-STING SKIN-PREP EX Administer one towelette to bilateral heels and Bilateral tips of Great Toes once every shift   nystatin powder Commonly known as:  MYCOSTATIN/NYSTOP Apply thin film  of powder topically underneath each breast every shift until healed for redness/itching.   nystatin cream Commonly known as:  MYCOSTATIN Apply thin film in folds of stomach and thigh areas 2 times daily until areas are healed   omeprazole 20 MG capsule Commonly known as:  PRILOSEC Take 20 mg by mouth 2 (two) times daily.   OXYGEN Inhale 2 L into the lungs every 4 (four) hours as needed. Check O2 sats prior to placing on patient and at least every 4 hours after applying. Notify MD if oxygen sats drop below baseline while receiving oxygen or no improvement in dyspnea.   polyethylene glycol packet Commonly known as:  MIRALAX / GLYCOLAX Take 17 g by mouth daily.   pravastatin 10 MG tablet Commonly known as:  PRAVACHOL Take 10 mg by mouth once a week. On Sunday   rOPINIRole 0.25 MG tablet Commonly known as:  REQUIP Take 1 tablet (0.25 mg total) by mouth at bedtime.   sennosides-docusate sodium 8.6-50 MG tablet Commonly known as:  SENOKOT-S Take 1 tablet by mouth daily as needed.   torsemide 5 MG tablet Commonly known as:  DEMADEX Take 5 mg by mouth daily.   traZODone 50 MG tablet Commonly known as:  DESYREL Take 25 mg by mouth at bedtime. 1/2 tab   trolamine salicylate 10 % cream Commonly known as:  ASPERCREME Apply 1 application topically 3 (three) times daily as  needed for muscle pain. Apply and massage on the right hip/outer thigh.       Review of Systems  Unable to perform ROS: Patient nonverbal  Constitutional: Negative for activity change, appetite change, chills, diaphoresis and fever.  HENT: Negative for congestion, mouth sores, nosebleeds, postnasal drip, sneezing, sore throat, trouble swallowing and voice change.   Respiratory: Negative for apnea, cough, choking, chest tightness, shortness of breath and wheezing.   Cardiovascular: Negative for chest pain, palpitations and leg swelling.  Gastrointestinal: Negative for abdominal distention, abdominal pain, constipation, diarrhea and nausea.  Genitourinary: Negative for difficulty urinating, dysuria, frequency and urgency.  Musculoskeletal: Positive for arthralgias (typical arthritis). Negative for back pain, gait problem and myalgias.  Skin: Negative for color change, pallor, rash and wound.  Neurological: Positive for weakness. Negative for dizziness, tremors, syncope, speech difficulty, numbness and headaches.  Psychiatric/Behavioral: Positive for confusion. Negative for agitation and behavioral problems.  All other systems reviewed and are negative.   Immunization History  Administered Date(s) Administered  . Influenza Split 12/05/2011  . Influenza-Unspecified 10/21/2013, 10/08/2014, 10/08/2015, 10/20/2016  . PPD Test 11/05/2015, 10/22/2016   Pertinent  Health Maintenance Due  Topic Date Due  . FOOT EXAM  12/05/1943  . OPHTHALMOLOGY EXAM  12/05/1943  . DEXA SCAN  12/05/2000  . PNA vac Low Risk Adult (1 of 2 - PCV13) 12/05/2000  . HEMOGLOBIN A1C  08/05/2017  . INFLUENZA VACCINE  08/22/2017   No flowsheet data found. Functional Status Survey:    Vitals:   05/16/17 0837  BP: 128/64  Pulse: 86  Resp: 18  Temp: 97.6 F (36.4 C)  TempSrc: Oral  SpO2: 96%  Weight: 127 lb 6.4 oz (57.8 kg)  Height: 5\' 2"  (1.575 m)   Body mass index is 23.3 kg/m. Physical Exam    Constitutional: Vital signs are normal. She appears well-developed and well-nourished. She appears lethargic. She is sleeping. She does not appear ill. No distress. Nasal cannula in place.  HENT:  Head: Normocephalic and atraumatic.  Mouth/Throat: Uvula is midline, oropharynx is clear and moist and mucous  membranes are normal. Mucous membranes are not pale, not dry and not cyanotic.  Eyes: Pupils are equal, round, and reactive to light. Conjunctivae, EOM and lids are normal.  Neck: Trachea normal, normal range of motion and full passive range of motion without pain. Neck supple. No JVD present. No tracheal deviation, no edema and no erythema present. No thyromegaly present.  Cardiovascular: Normal rate, regular rhythm, intact distal pulses and normal pulses. Exam reveals no gallop, no distant heart sounds and no friction rub.  Murmur heard. Pulses:      Dorsalis pedis pulses are 2+ on the right side, and 2+ on the left side.  No edema  Pulmonary/Chest: Effort normal. No accessory muscle usage. No respiratory distress. She has decreased breath sounds in the right lower field and the left lower field. She has no wheezes. She has no rhonchi. She has no rales. She exhibits no tenderness.  Abdominal: Soft. Normal appearance and bowel sounds are normal. She exhibits no distension and no ascites. There is no tenderness.  Musculoskeletal: Normal range of motion. She exhibits no edema or tenderness.  Expected osteoarthritis, stiffness; Bilateral Calves soft, supple. Negative Homan's Sign. B- pedal pulses equal; generalized weakness  Neurological: She has normal strength. She appears lethargic. She displays atrophy. She exhibits abnormal muscle tone. Coordination and gait abnormal.  Skin: Skin is warm, dry and intact. She is not diaphoretic. No cyanosis. No pallor. Nails show no clubbing.  Psychiatric: She has a normal mood and affect. Judgment and thought content normal. She is slowed and withdrawn.  Cognition and memory are impaired. She is noncommunicative. She exhibits abnormal recent memory and abnormal remote memory. She is inattentive.  Nursing note and vitals reviewed.   Labs reviewed: Recent Labs    05/21/16 1300 07/13/16 0710 02/05/17 0530  NA 142 140 143  K 3.7 3.9 3.9  CL 105 106 103  CO2 25 26 29   GLUCOSE 158* 128* 175*  BUN 23* 25* 38*  CREATININE 0.96 0.80 1.08*  CALCIUM 9.1 9.0 8.8*  MG 1.7  --  3.1*   Recent Labs    05/21/16 1300 07/13/16 0710 02/05/17 0530  AST 25 15 19   ALT 18 14 16   ALKPHOS 47 46 57  BILITOT 0.5 0.6 0.6  PROT 6.7 6.4* 5.7*  ALBUMIN 3.9 3.6 3.1*   Recent Labs    05/21/16 1300 07/13/16 0710 02/05/17 0530  WBC 8.9 9.2 6.0  NEUTROABS 5.7 4.7 2.9  HGB 14.3 13.6 13.0  HCT 44.2 40.4 39.9  MCV 91.2 89.1 90.4  PLT 292 308 232   Lab Results  Component Value Date   TSH 1.830 02/05/2017   Lab Results  Component Value Date   HGBA1C 8.4 (H) 02/05/2017   Lab Results  Component Value Date   CHOL 180 02/05/2017   HDL 33 (L) 02/05/2017   LDLCALC 93 02/05/2017   TRIG 271 (H) 02/05/2017   CHOLHDL 5.5 02/05/2017    Significant Diagnostic Results in last 30 days:  No results found.  Assessment/Plan Cindy [name] was seen today for medical management of chronic issues.  Diagnoses and all orders for this visit:  Renal insufficiency  H/O primary malignant neoplasm of urinary bladder  Vitamin B12 deficiency anemia due to intrinsic factor deficiency   Above listed conditions stable  Continue current medication regimen  Monitor for urinary obstruction or decreased urinary output  Renally adjust meds as appropriate  Safety precautions  Fall precautions  Continue Hospice services for EOL care  Family/ staff Communication:  Total Time:  Documentation:  Face to Face:  Family/Phone:   Labs/tests ordered:  Not due  Medication list reviewed and assessed for continued appropriateness. Monthly medication orders  reviewed and signed.  Cindy Ports, NP-C Geriatrics [facility] [phone], [place] [address] Cell Phone (Mon-Fri 8am-5pm):  [phone] On Call:  [phone] & follow prompts after 5pm & weekends Office Phone:  [phone] Office Fax:  [phone]

## 2017-05-22 ENCOUNTER — Encounter
Admission: RE | Admit: 2017-05-22 | Discharge: 2017-05-22 | Disposition: A | Discharge status: Home / Self Care | Payer: Medicare Other | Source: Ambulatory Visit | Attending: Internal Medicine | Admitting: Internal Medicine

## 2017-05-29 ENCOUNTER — Other Ambulatory Visit: Payer: Self-pay

## 2017-05-29 MED ORDER — HYDROCODONE-ACETAMINOPHEN 10-325 MG PO TABS: 1.0000 | ORAL_TABLET | Freq: Three times a day (TID) | ORAL | 0 refills | Status: DC

## 2017-05-29 MED ORDER — HYDROCODONE-ACETAMINOPHEN 10-325 MG PO TABS: 1.0000 | ORAL_TABLET | ORAL | 0 refills | Status: DC | PRN

## 2017-05-29 NOTE — Telephone Encounter (Signed)
Rx sent to [facility] phone : [phone] , fax : [phone]  

## 2017-06-12 ENCOUNTER — Encounter: Payer: Self-pay | Admitting: Gerontology

## 2017-06-12 ENCOUNTER — Non-Acute Institutional Stay (SKILLED_NURSING_FACILITY): Payer: Medicare Other | Admitting: Gerontology

## 2017-06-12 DIAGNOSIS — E7800 Pure hypercholesterolemia, unspecified: Secondary | ICD-10-CM | POA: Diagnosis not present

## 2017-06-12 DIAGNOSIS — F29 Unspecified psychosis not due to a substance or known physiological condition: Secondary | ICD-10-CM

## 2017-06-12 DIAGNOSIS — G2581 Restless legs syndrome: Secondary | ICD-10-CM

## 2017-06-17 NOTE — Progress Notes (Signed)
Location:    Nursing Home Room Number: 316A Place of Service:  SNF (31) Provider:  Toni Arthurs, NP-C  Kirk Ruths, MD  Patient Care Team: Kirk Ruths, MD as PCP - General (Internal Medicine) Einar Pheasant, MD (Internal Medicine)  Extended Emergency Contact Information Primary Emergency Contact: Duwayne Heck Address: [address]          [place], [place] [address] [name] of [place] Phone: [phone] Relation: None Secondary Emergency Contact: [name] Address: [address]          [place], [place] [address] Home Phone: [phone] Relation: None  Code Status:  DNR Goals of care: Advanced Directive information Advanced Directives 06/12/2017  Does Patient Have a Medical Advance Directive? Yes  Type of Advance Directive Out of facility DNR (pink MOST or yellow form)  Does patient want to make changes to medical advance directive? No - Patient declined  Copy of [place] in Chart? -  Pre-existing out of facility DNR order (yellow form or pink MOST form) Yellow form placed in chart (order not valid for inpatient use)     Chief Complaint  Patient presents with  . Medical Management of Chronic Issues    Routine Visit    HPI:  Pt is a 84 y.o. female seen today for medical management of chronic diseases.    Unsp psychosis not due to a substance or known physiol cond Stable. No recent negative behaviors. Pt sedate. Receives Ativan 0.5 mg po BID for anxiety.   RLS (restless legs syndrome) Stable. No recent c/o worsening sx. On Requip 0.25 mg po Q HS.   Pure hypercholesterolemia Stable. On Pravachol 10 mg po Q week. No c/o myalgias.  Please note pt with limited verbal/cognitive ability. Unable to obtain complete ROS. Some ROS info obtained from staff and documentation.   Past Medical History:  Diagnosis Date  . Alzheimer's disease   . Anemia   . AR (allergic rhinitis)   . Arthritis   . Atrial fibrillation ([place])      post op, converted to NSR wtih amiodarone  . Bladder cancer ([place]) 2003   s/p resection and chemotherapy, followed by Dr Jacqlyn Larsen  . Cataract cortical, senile   . Chicken pox   . COPD (chronic obstructive pulmonary disease) ([place])   . Dementia   . Diabetes mellitus ([place])   . Diabetes mellitus without complication ([place])   . Dysphagia   . Eczema    unspecified  . GERD (gastroesophageal reflux disease)   . History of bronchitis   . Hives   . Hypercholesterolemia   . Hyperlipemia   . Hypertension   . Hypothyroidism   . Inner ear disease   . Lung cancer ([place])    squamous cell s/p left upper lobe llingulectomy 12/08/04 - Dr Arlyce Dice  . Shingles   . Thyroid disease   . Ventral hernia   . Vitamin B12 deficiency anemia due to intrinsic factor deficiency    Past Surgical History:  Procedure Laterality Date  . BLADDER SURGERY  2003   for bladder cancer - Dr Jacqlyn Larsen  . CHOLECYSTECTOMY  1986  . HERNIA REPAIR    . LUNG CANCER SURGERY  12/08/04   s/p left upper lobe lingulectomy - Dr Arlyce Dice  . ORIF FEMUR FRACTURE Right 09/18/2012  . ORIF HIP FRACTURE  08/26/2012   Open reduction internal fixation, right intertrochanteric hip fracture    Allergies  Allergen Reactions  . Lovenox [Enoxaparin Sodium]   . Novolog [  Insulin Aspart]     Allergies as of 06/12/2017      Reactions   Lovenox [enoxaparin Sodium]    Novolog [insulin Aspart]       Medication List        Accurate as of 06/12/17 11:59 PM. Always use your most recent med list.          bisacodyl 10 MG suppository Commonly known as:  DULCOLAX Place 10 mg rectally daily as needed for mild constipation or moderate constipation. *If Milk of Magnesia does not resolve constipation.*   DERMACLOUD EX Apply liberal amount topically to area of skin irritation prn. OK to leave at bedside.   gabapentin 100 MG capsule Commonly known as:  NEURONTIN Take 100 mg by mouth daily. 9 am   gabapentin 300 MG capsule Commonly known as:   NEURONTIN Take 300 mg by mouth at bedtime.   glimepiride 4 MG tablet Commonly known as:  AMARYL Take 4 mg by mouth 2 (two) times daily.   HYDROcodone-acetaminophen 10-325 MG tablet Commonly known as:  NORCO Take 1 tablet by mouth 3 (three) times daily.   HYDROcodone-acetaminophen 10-325 MG tablet Commonly known as:  NORCO Take 1 tablet by mouth every 4 (four) hours as needed.   ipratropium-albuterol 0.5-2.5 (3) MG/3ML Soln Commonly known as:  DUONEB Take 3 mLs by nebulization 3 (three) times daily.   ipratropium-albuterol 0.5-2.5 (3) MG/3ML Soln Commonly known as:  DUONEB Take 3 mLs by nebulization every 6 (six) hours as needed.   Lidocaine 4 % Ptch Apply 1 patch topically daily. Apply at 9 am to area on lower back daily. Remove after 12 hours   LORazepam 0.5 MG tablet Commonly known as:  ATIVAN Take 0.5 mg by mouth 2 (two) times daily.   losartan 50 MG tablet Commonly known as:  COZAAR Take 50 mg by mouth daily.   MAG-AL PLUS XS [phone] MG/5ML suspension Generic drug:  alum & mag hydroxide-simeth Take 5 mLs by mouth 3 (three) times daily. Give with or right after meals   magnesium hydroxide 400 MG/5ML suspension Commonly known as:  MILK OF MAGNESIA Take 30 mLs by mouth every 4 (four) hours as needed for mild constipation. If Constipation/ no BM for 2 days.  *no results in 24 hours may administer bisacodyl suppository*   Melatonin 3 MG Tabs Take 6 mg by mouth at bedtime. 2 tabs   metoprolol succinate 25 MG 24 hr tablet Commonly known as:  TOPROL-XL Take 25 mg by mouth daily.   montelukast 10 MG tablet Commonly known as:  SINGULAIR Take 10 mg by mouth at bedtime.   morphine 20 MG/ML concentrated solution Commonly known as:  ROXANOL Take 10 mg by mouth daily. (0.5 ml) Give prior to the AM bath- for pain, dyspnea, restlessness   morphine 20 MG/ML concentrated solution Commonly known as:  ROXANOL Take 0.25-0.5 mLs (5-10 mg total) by mouth every hour as  needed.   mupirocin cream 2 % Commonly known as:  BACTROBAN Apply 1 application topically daily. Apply thin film to Right 3rd toe, place folded 2x2 gauze between the 3rd and 4th toes daily   NO-STING SKIN-PREP EX Administer one towelette to bilateral heels and Bilateral tips of Great Toes once every shift   nystatin powder Commonly known as:  MYCOSTATIN/NYSTOP Apply thin film of powder topically underneath each breast every shift until healed for redness/itching.   nystatin cream Commonly known as:  MYCOSTATIN Apply thin film in folds of stomach and thigh areas 2  times daily until areas are healed   omeprazole 20 MG capsule Commonly known as:  PRILOSEC Take 20 mg by mouth 2 (two) times daily.   OXYGEN Inhale 2 L into the lungs every 4 (four) hours as needed. Check O2 sats prior to placing on patient and at least every 4 hours after applying. Notify MD if oxygen sats drop below baseline while receiving oxygen or no improvement in dyspnea.   polyethylene glycol packet Commonly known as:  MIRALAX / GLYCOLAX Take 17 g by mouth daily.   pravastatin 10 MG tablet Commonly known as:  PRAVACHOL Take 10 mg by mouth once a week. On Sunday   rOPINIRole 0.25 MG tablet Commonly known as:  REQUIP Take 1 tablet (0.25 mg total) by mouth at bedtime.   sennosides-docusate sodium 8.6-50 MG tablet Commonly known as:  SENOKOT-S Take 1 tablet by mouth daily as needed.   torsemide 5 MG tablet Commonly known as:  DEMADEX Take 5 mg by mouth daily.   traZODone 50 MG tablet Commonly known as:  DESYREL Take 25 mg by mouth at bedtime. 1/2 tab   trolamine salicylate 10 % cream Commonly known as:  ASPERCREME Apply 1 application topically 3 (three) times daily as needed for muscle pain. Apply and massage on the right hip/outer thigh.       Review of Systems  Unable to perform ROS: Acuity of condition  Constitutional: Negative for activity change, appetite change, chills, diaphoresis and  fever.  HENT: Negative for congestion, mouth sores, nosebleeds, postnasal drip, sneezing, sore throat, trouble swallowing and voice change.   Respiratory: Negative for apnea, cough, choking, chest tightness, shortness of breath and wheezing.   Cardiovascular: Negative for chest pain, palpitations and leg swelling.  Gastrointestinal: Negative for abdominal distention, abdominal pain, constipation, diarrhea and nausea.  Genitourinary: Negative for difficulty urinating, dysuria, frequency and urgency.  Musculoskeletal: Positive for arthralgias (typical arthritis). Negative for back pain, gait problem and myalgias.  Skin: Negative for color change, pallor, rash and wound.  Neurological: Positive for weakness. Negative for dizziness, tremors, syncope, speech difficulty, numbness and headaches.  Psychiatric/Behavioral: Negative for agitation and behavioral problems.  All other systems reviewed and are negative.   Immunization History  Administered Date(s) Administered  . Influenza Split 12/05/2011  . Influenza-Unspecified 10/21/2013, 10/08/2014, 10/08/2015, 10/20/2016  . PPD Test 11/05/2015, 10/22/2016   Pertinent  Health Maintenance Due  Topic Date Due  . FOOT EXAM  12/05/1943  . OPHTHALMOLOGY EXAM  12/05/1943  . DEXA SCAN  12/05/2000  . PNA vac Low Risk Adult (1 of 2 - PCV13) 12/05/2000  . HEMOGLOBIN A1C  08/05/2017  . INFLUENZA VACCINE  08/22/2017   No flowsheet data found. Functional Status Survey:    Vitals:   06/12/17 1027  BP: (!) 100/55  Pulse: 80  Resp: 16  Temp: 97.8 F (36.6 C)  TempSrc: Oral  SpO2: 98%  Weight: 126 lb 14.4 oz (57.6 kg)  Height: 5\' 2"  (1.575 m)   Body mass index is 23.21 kg/m. Physical Exam  Constitutional: Vital signs are normal. She appears well-developed and well-nourished. She appears lethargic. She is sleeping and cooperative. She does not appear ill. No distress. Nasal cannula in place.  HENT:  Head: Normocephalic and atraumatic.    Mouth/Throat: Uvula is midline, oropharynx is clear and moist and mucous membranes are normal. Mucous membranes are not pale, not dry and not cyanotic.  Eyes: Pupils are equal, round, and reactive to light. Conjunctivae, EOM and lids are normal.  Neck:  Trachea normal, normal range of motion and full passive range of motion without pain. Neck supple. No JVD present. No tracheal deviation, no edema and no erythema present. No thyromegaly present.  Cardiovascular: Normal rate, regular rhythm, normal heart sounds, intact distal pulses and normal pulses. Exam reveals no gallop, no distant heart sounds and no friction rub.  No murmur heard. Pulses:      Dorsalis pedis pulses are 2+ on the right side, and 2+ on the left side.  No edema  Pulmonary/Chest: Effort normal and breath sounds normal. No accessory muscle usage. No respiratory distress. She has no decreased breath sounds. She has no wheezes. She has no rhonchi. She has no rales. She exhibits no tenderness.  Abdominal: Soft. Normal appearance and bowel sounds are normal. She exhibits no distension and no ascites. There is no tenderness.  Musculoskeletal: Normal range of motion. She exhibits no edema or tenderness.  Expected osteoarthritis, stiffness; Bilateral Calves soft, supple. Negative Homan's Sign. B- pedal pulses equal; generalized weakness, immobile   Neurological: She appears lethargic. She displays atrophy. She exhibits abnormal muscle tone. Coordination and gait abnormal.  Skin: Skin is warm, dry and intact. She is not diaphoretic. No cyanosis. No pallor. Nails show no clubbing.  Psychiatric: Her speech is normal. Judgment and thought content normal. Her affect is blunt. She is slowed and withdrawn. Cognition and memory are impaired. She exhibits abnormal recent memory. She is inattentive.  Nursing note and vitals reviewed.   Labs reviewed: Recent Labs    07/13/16 0710 02/05/17 0530  NA 140 143  K 3.9 3.9  CL 106 103  CO2 26 29   GLUCOSE 128* 175*  BUN 25* 38*  CREATININE 0.80 1.08*  CALCIUM 9.0 8.8*  MG  --  3.1*   Recent Labs    07/13/16 0710 02/05/17 0530  AST 15 19  ALT 14 16  ALKPHOS 46 57  BILITOT 0.6 0.6  PROT 6.4* 5.7*  ALBUMIN 3.6 3.1*   Recent Labs    07/13/16 0710 02/05/17 0530  WBC 9.2 6.0  NEUTROABS 4.7 2.9  HGB 13.6 13.0  HCT 40.4 39.9  MCV 89.1 90.4  PLT 308 232   Lab Results  Component Value Date   TSH 1.830 02/05/2017   Lab Results  Component Value Date   HGBA1C 8.4 (H) 02/05/2017   Lab Results  Component Value Date   CHOL 180 02/05/2017   HDL 33 (L) 02/05/2017   LDLCALC 93 02/05/2017   TRIG 271 (H) 02/05/2017   CHOLHDL 5.5 02/05/2017    Significant Diagnostic Results in last 30 days:  No results found.  Assessment/Plan Cindy [name] was seen today for medical management of chronic issues.  Diagnoses and all orders for this visit:  Unsp psychosis not due to a substance or known physiol cond ([place])  RLS (restless legs syndrome)  Pure hypercholesterolemia   Above listed conditions stable  Continue current medication regimen  Continue comfort measures  Safety precautions  Fall precautions  Labs deferred d/t Hospice status  OK to hold meds if pt sedate/ unable to swallow  Family/ staff Communication:   Total Time:  Documentation:  Face to Face:  Family/Phone:   Labs/tests ordered:  Deferred- on Hospice services  Medication list reviewed and assessed for continued appropriateness. Monthly medication orders reviewed and signed.  Vikki Ports, NP-C Geriatrics [facility] [phone], [place] [address] Cell Phone (Mon-Fri 8am-5pm):  [phone] On Call:  [phone] & follow  prompts after 5pm & weekends Office Phone:  [phone] Office Fax:  [phone]

## 2017-06-17 NOTE — Assessment & Plan Note (Signed)
Stable. On Pravachol 10 mg po Q week. No c/o myalgias.

## 2017-06-17 NOTE — Assessment & Plan Note (Signed)
Stable. No recent negative behaviors. Pt sedate. Receives Ativan 0.5 mg po BID for anxiety.

## 2017-06-17 NOTE — Assessment & Plan Note (Signed)
Stable. No recent c/o worsening sx. On Requip 0.25 mg po Q HS.

## 2017-06-22 ENCOUNTER — Encounter
Admission: RE | Admit: 2017-06-22 | Discharge: 2017-06-22 | Disposition: A | Discharge status: Home / Self Care | Payer: Medicare Other | Source: Ambulatory Visit | Attending: Internal Medicine | Admitting: Internal Medicine

## 2017-07-01 ENCOUNTER — Other Ambulatory Visit: Payer: Self-pay

## 2017-07-01 MED ORDER — HYDROCODONE-ACETAMINOPHEN 10-325 MG PO TABS: 1.0000 | ORAL_TABLET | ORAL | 0 refills | Status: DC | PRN

## 2017-07-01 MED ORDER — HYDROCODONE-ACETAMINOPHEN 10-325 MG PO TABS: 1.0000 | ORAL_TABLET | Freq: Three times a day (TID) | ORAL | 0 refills | Status: DC

## 2017-07-01 NOTE — Telephone Encounter (Signed)
Rx sent to [facility] phone : [phone] , fax : [phone]  

## 2017-07-16 ENCOUNTER — Other Ambulatory Visit: Payer: Self-pay

## 2017-07-16 MED ORDER — MORPHINE SULFATE (CONCENTRATE) 20 MG/ML PO SOLN: 10.0000 mg | Freq: Every day | ORAL | 0 refills | Status: DC

## 2017-07-16 MED ORDER — MORPHINE SULFATE (CONCENTRATE) 20 MG/ML PO SOLN: 5.0000 mg | ORAL | 0 refills | Status: DC | PRN

## 2017-07-16 NOTE — Telephone Encounter (Signed)
Rx sent to [facility] phone : [phone] , fax : [phone]  

## 2017-07-17 ENCOUNTER — Encounter: Payer: Self-pay | Admitting: Gerontology

## 2017-07-17 ENCOUNTER — Other Ambulatory Visit: Payer: Self-pay

## 2017-07-22 ENCOUNTER — Encounter
Admission: RE | Admit: 2017-07-22 | Discharge: 2017-07-22 | Disposition: A | Discharge status: Home / Self Care | Payer: Medicare Other | Source: Ambulatory Visit | Attending: Internal Medicine | Admitting: Internal Medicine

## 2017-07-24 ENCOUNTER — Other Ambulatory Visit: Payer: Self-pay

## 2017-07-24 MED ORDER — HYDROCODONE-ACETAMINOPHEN 5-325 MG PO TABS: 2.0000 | ORAL_TABLET | Freq: Three times a day (TID) | ORAL | 0 refills | Status: DC

## 2017-07-24 MED ORDER — HYDROCODONE-ACETAMINOPHEN 5-325 MG PO TABS: 1.0000 | ORAL_TABLET | ORAL | 0 refills | Status: AC | PRN

## 2017-07-24 MED ORDER — MORPHINE SULFATE (CONCENTRATE) 20 MG/ML PO SOLN: ORAL | 0 refills | Status: DC

## 2017-07-24 NOTE — Telephone Encounter (Signed)
Rx sent to [facility] phone : [phone] , fax : [phone]  

## 2017-07-31 NOTE — Progress Notes (Signed)
Opened in error; Disregard.

## 2017-08-06 ENCOUNTER — Other Ambulatory Visit: Payer: Self-pay

## 2017-08-06 MED ORDER — MORPHINE SULFATE (CONCENTRATE) 20 MG/ML PO SOLN: ORAL | 0 refills | Status: DC

## 2017-08-06 MED ORDER — MORPHINE SULFATE (CONCENTRATE) 20 MG/ML PO SOLN: 5.0000 mg | ORAL | 0 refills | Status: DC | PRN

## 2017-08-06 NOTE — Telephone Encounter (Signed)
Rx sent to [facility] phone : [phone] , fax : [phone]  

## 2017-08-22 ENCOUNTER — Encounter
Admission: RE | Admit: 2017-08-22 | Discharge: 2017-08-22 | Disposition: A | Discharge status: Home / Self Care | Payer: Medicare Other | Source: Ambulatory Visit | Attending: Internal Medicine | Admitting: Internal Medicine

## 2017-09-02 ENCOUNTER — Other Ambulatory Visit: Payer: Self-pay

## 2017-09-02 MED ORDER — LORAZEPAM 0.5 MG PO TABS: 0.5000 mg | ORAL_TABLET | Freq: Two times a day (BID) | ORAL | 0 refills | Status: DC

## 2017-09-02 NOTE — Telephone Encounter (Signed)
Rx sent to [facility] phone : [phone] , fax : [phone]  

## 2017-09-04 ENCOUNTER — Other Ambulatory Visit: Payer: Self-pay

## 2017-09-04 MED ORDER — LORAZEPAM 0.5 MG PO TABS: 0.5000 mg | ORAL_TABLET | ORAL | 0 refills | Status: AC | PRN

## 2017-09-04 MED ORDER — HYDROCODONE-ACETAMINOPHEN 5-325 MG PO TABS: 2.0000 | ORAL_TABLET | Freq: Three times a day (TID) | ORAL | 0 refills | Status: DC

## 2017-09-04 NOTE — Telephone Encounter (Signed)
Rx sent to [facility] phone : [phone] , fax : [phone]  

## 2017-09-22 ENCOUNTER — Encounter
Admission: RE | Admit: 2017-09-22 | Discharge: 2017-09-22 | Disposition: A | Discharge status: Home / Self Care | Payer: Medicare Other | Source: Ambulatory Visit | Attending: Internal Medicine | Admitting: Internal Medicine

## 2017-09-24 ENCOUNTER — Other Ambulatory Visit: Payer: Self-pay

## 2017-09-24 MED ORDER — MORPHINE SULFATE (CONCENTRATE) 20 MG/ML PO SOLN: ORAL | 0 refills | Status: AC

## 2017-09-24 NOTE — Telephone Encounter (Signed)
Rx sent to [facility] phone : [phone] , fax : [phone]  

## 2017-09-25 ENCOUNTER — Other Ambulatory Visit: Payer: Self-pay

## 2017-09-25 MED ORDER — MORPHINE SULFATE (CONCENTRATE) 20 MG/ML PO SOLN
5.0000 mg | ORAL | 0 refills | Status: AC | PRN
Start: 2017-09-25 — End: ?

## 2017-09-25 NOTE — Telephone Encounter (Signed)
Rx sent to [facility] phone : [phone] , fax : [phone]  

## 2017-09-30 ENCOUNTER — Other Ambulatory Visit: Payer: Self-pay

## 2017-09-30 MED ORDER — HYDROCODONE-ACETAMINOPHEN 5-325 MG PO TABS: 2.0000 | ORAL_TABLET | Freq: Three times a day (TID) | ORAL | 0 refills | Status: AC

## 2017-09-30 NOTE — Telephone Encounter (Signed)
Rx sent to [facility] phone : [phone] , fax : [phone]  

## 2017-10-22 ENCOUNTER — Encounter
Admission: RE | Admit: 2017-10-22 | Discharge: 2017-10-22 | Disposition: A | Discharge status: Home / Self Care | Payer: Medicare Other | Source: Ambulatory Visit | Attending: Internal Medicine | Admitting: Internal Medicine

## 2017-10-24 ENCOUNTER — Other Ambulatory Visit: Payer: Self-pay

## 2017-10-24 MED ORDER — LORAZEPAM 0.5 MG PO TABS: ORAL_TABLET | ORAL | 0 refills | Status: AC

## 2017-11-22 DEATH — deceased
